# Patient Record
Sex: Female | Born: 1995 | Race: Black or African American | Hispanic: No | Smoking: Current every day smoker
Health system: Southern US, Community
[De-identification: ages and names within clinical notes are randomized; demographics above are authoritative.]

## PROBLEM LIST (undated history)

## (undated) ENCOUNTER — Inpatient Hospital Stay: Payer: Self-pay

## (undated) DIAGNOSIS — F32A Depression, unspecified: Secondary | ICD-10-CM

## (undated) DIAGNOSIS — N12 Tubulo-interstitial nephritis, not specified as acute or chronic: Secondary | ICD-10-CM

## (undated) DIAGNOSIS — F329 Major depressive disorder, single episode, unspecified: Secondary | ICD-10-CM

## (undated) DIAGNOSIS — A599 Trichomoniasis, unspecified: Secondary | ICD-10-CM

## (undated) DIAGNOSIS — D649 Anemia, unspecified: Secondary | ICD-10-CM

## (undated) DIAGNOSIS — F1910 Other psychoactive substance abuse, uncomplicated: Secondary | ICD-10-CM

## (undated) HISTORY — PX: NO PAST SURGERIES: SHX2092

---
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
## 2014-04-16 ENCOUNTER — Emergency Department: Payer: Self-pay | Admitting: Emergency Medicine

## 2014-06-08 ENCOUNTER — Ambulatory Visit: Payer: Self-pay | Admitting: Family Medicine

## 2014-06-08 LAB — CBC WITH DIFFERENTIAL/PLATELET
BASOS ABS: 0 10*3/uL (ref 0.0–0.1)
Basophil %: 0.2 %
EOS PCT: 0.2 %
Eosinophil #: 0 10*3/uL (ref 0.0–0.7)
HCT: 39.9 % (ref 35.0–47.0)
HGB: 12.9 g/dL (ref 12.0–16.0)
Lymphocyte #: 1.4 10*3/uL (ref 1.0–3.6)
Lymphocyte %: 16 %
MCH: 29.5 pg (ref 26.0–34.0)
MCHC: 32.3 g/dL (ref 32.0–36.0)
MCV: 91 fL (ref 80–100)
Monocyte #: 1 x10 3/mm — ABNORMAL HIGH (ref 0.2–0.9)
Monocyte %: 11.4 %
Neutrophil #: 6.3 10*3/uL (ref 1.4–6.5)
Neutrophil %: 72.2 %
Platelet: 265 10*3/uL (ref 150–440)
RBC: 4.37 10*6/uL (ref 3.80–5.20)
RDW: 16.7 % — AB (ref 11.5–14.5)
WBC: 8.8 10*3/uL (ref 3.6–11.0)

## 2014-06-08 LAB — COMPREHENSIVE METABOLIC PANEL
ALBUMIN: 3.9 g/dL (ref 3.8–5.6)
AST: 14 U/L (ref 0–26)
Alkaline Phosphatase: 75 U/L
Anion Gap: 10 (ref 7–16)
BUN: 6 mg/dL — AB (ref 9–21)
Bilirubin,Total: 0.6 mg/dL (ref 0.2–1.0)
CALCIUM: 9.1 mg/dL (ref 9.0–10.7)
CO2: 27 mmol/L — AB (ref 16–25)
Chloride: 100 mmol/L (ref 97–107)
Creatinine: 0.83 mg/dL (ref 0.60–1.30)
EGFR (African American): >60
Glucose: 99 mg/dL (ref 65–99)
OSMOLALITY: 271 (ref 275–301)
Potassium: 3.5 mmol/L (ref 3.3–4.7)
SGPT (ALT): 14 U/L
Sodium: 137 mmol/L (ref 132–141)
Total Protein: 8.5 g/dL (ref 6.4–8.6)

## 2014-06-08 LAB — URINALYSIS, COMPLETE
Bilirubin,UR: NEGATIVE
Glucose,UR: NEGATIVE
Ketone: NEGATIVE
Nitrite: POSITIVE
PH: 6 (ref 5.0–8.0)
Protein: 30
Specific Gravity: 1.02 (ref 1.000–1.030)

## 2014-06-08 LAB — PREGNANCY, URINE: PREGNANCY TEST, URINE: NEGATIVE m[IU]/mL

## 2014-06-09 ENCOUNTER — Emergency Department: Payer: Self-pay | Admitting: Internal Medicine

## 2014-06-09 LAB — URINALYSIS, COMPLETE
Bilirubin,UR: NEGATIVE
GLUCOSE, UR: NEGATIVE mg/dL (ref 0–75)
Nitrite: POSITIVE
PH: 5 (ref 4.5–8.0)
Protein: 100
RBC,UR: 9 /HPF (ref 0–5)
Specific Gravity: 1.014 (ref 1.003–1.030)
WBC UR: 506 /HPF (ref 0–5)

## 2014-06-09 LAB — CBC WITH DIFFERENTIAL/PLATELET
BASOS ABS: 0 10*3/uL (ref 0.0–0.1)
Basophil %: 0.1 %
Eosinophil #: 0 10*3/uL (ref 0.0–0.7)
Eosinophil %: 0 %
HCT: 39.5 % (ref 35.0–47.0)
HGB: 12.6 g/dL (ref 12.0–16.0)
Lymphocyte #: 1.8 10*3/uL (ref 1.0–3.6)
Lymphocyte %: 16.5 %
MCH: 29.3 pg (ref 26.0–34.0)
MCHC: 31.8 g/dL — ABNORMAL LOW (ref 32.0–36.0)
MCV: 92 fL (ref 80–100)
MONO ABS: 1.7 x10 3/mm — AB (ref 0.2–0.9)
Monocyte %: 15.7 %
NEUTROS ABS: 7.4 10*3/uL — AB (ref 1.4–6.5)
Neutrophil %: 67.7 %
PLATELETS: 245 10*3/uL (ref 150–440)
RBC: 4.29 10*6/uL (ref 3.80–5.20)
RDW: 16.4 % — ABNORMAL HIGH (ref 11.5–14.5)
WBC: 11 10*3/uL (ref 3.6–11.0)

## 2014-06-09 LAB — COMPREHENSIVE METABOLIC PANEL
ALK PHOS: 69 U/L
AST: 19 U/L (ref 0–26)
Albumin: 3.4 g/dL — ABNORMAL LOW (ref 3.8–5.6)
Anion Gap: 10 (ref 7–16)
BILIRUBIN TOTAL: 1 mg/dL (ref 0.2–1.0)
BUN: 7 mg/dL — ABNORMAL LOW (ref 9–21)
CO2: 26 mmol/L — AB (ref 16–25)
Calcium, Total: 8.8 mg/dL — ABNORMAL LOW (ref 9.0–10.7)
Chloride: 102 mmol/L (ref 97–107)
Creatinine: 0.88 mg/dL (ref 0.60–1.30)
EGFR (Non-African Amer.): >60
GLUCOSE: 102 mg/dL — AB (ref 65–99)
OSMOLALITY: 274 (ref 275–301)
Potassium: 3.2 mmol/L — ABNORMAL LOW (ref 3.3–4.7)
SGPT (ALT): 13 U/L — ABNORMAL LOW
Sodium: 138 mmol/L (ref 132–141)
Total Protein: 8.1 g/dL (ref 6.4–8.6)

## 2014-06-09 LAB — LIPASE, BLOOD: Lipase: 85 U/L (ref 73–393)

## 2014-06-10 LAB — URINE CULTURE

## 2015-02-19 NOTE — H&P (Signed)
L&D Evaluation:  History:  HPI 19 yo G2P1001 with LMP of ? & [facility] 03/[facility]/13 [facility] +GC & Chalmydia tx on 10/2011 & TOC.   Patient's Medical History Eczema, abnormal TSH, hyperemesis, allergic rhiniitis,seasonal allergies   Patient's Surgical History none   Medications Pre Natal Vitamins   Allergies NKDA   Social History none   Family History Non-Contributory   ROS:  ROS All systems were reviewed.  HEENT, CNS, GI, GU, Respiratory, CV, Renal and Musculoskeletal systems were found to be normal.   Exam:  Vital Signs stable   General no apparent distress   Mental Status clear   Chest clear   Heart normal sinus rhythm, no murmur/gallop/rubs   Abdomen gravid, non-tender   Estimated Fetal Weight Average for gestational age   Fetal Position vtx   Back no CVAT   Edema 1+   Reflexes 1+   Clonus negative   Pelvic 2/90/vtx0   Mebranes Intact, variables noted   FHT normal rate with no decels   Ucx irregular   Skin dry   Lymph no lymphadenopathy   Impression:  Impression IUP at 41 weeks for post-dates IOL   Plan:  Plan Cervidil placed   Comments Ext fetal and uterine monitors   Electronic Signatures: Sharee PimpleJones, Caron W (CNM)  (Signed 21-Jun-14 23:15)  Authored: L&D Evaluation   Last Updated: 21-Jun-14 23:15 by Sharee PimpleJones, Caron W (CNM)

## 2015-06-19 ENCOUNTER — Emergency Department
Admission: EM | Admit: 2015-06-19 | Discharge: 2015-06-19 | Disposition: A | Discharge status: Home / Self Care | Payer: Medicaid Other | Attending: Emergency Medicine | Admitting: Emergency Medicine

## 2015-06-19 ENCOUNTER — Encounter: Payer: Self-pay | Admitting: Medical Oncology

## 2015-06-19 DIAGNOSIS — K088 Other specified disorders of teeth and supporting structures: Secondary | ICD-10-CM | POA: Diagnosis present

## 2015-06-19 DIAGNOSIS — K0889 Other specified disorders of teeth and supporting structures: Secondary | ICD-10-CM

## 2015-06-19 MED ORDER — IBUPROFEN 800 MG PO TABS: 800.0000 mg | ORAL_TABLET | Freq: Three times a day (TID) | ORAL | Status: DC | PRN

## 2015-06-19 MED ORDER — AMOXICILLIN 500 MG PO TABS: 500.0000 mg | ORAL_TABLET | Freq: Two times a day (BID) | ORAL | Status: DC

## 2015-06-19 MED ORDER — HYDROCODONE-ACETAMINOPHEN 5-325 MG PO TABS: 1.0000 | ORAL_TABLET | ORAL | Status: DC | PRN

## 2015-06-19 NOTE — ED Provider Notes (Signed)
[facility] Emergency Department Provider Note  ____________________________________________  Time seen: Approximately 5:12 PM  I have reviewed the triage vital signs and the nursing notes.   HISTORY  Chief Complaint Dental Pain    HPI Victoria Villarreal is a 19 y.o. female who presents for evaluation was tooth pain. Patient states she is scheduled to have them extracted next week is concerned about infection prior to extraction. Rates pain as 10 over 10.   History reviewed. No pertinent past medical history.  There are no active problems to display for this patient.   History reviewed. No pertinent past surgical history.  Current Outpatient Rx  Name  Route  Sig  Dispense  Refill  . amoxicillin (AMOXIL) 500 MG tablet   Oral   Take 1 tablet (500 mg total) by mouth 2 (two) times daily.   20 tablet   0   . HYDROcodone-acetaminophen (NORCO) 5-325 MG per tablet   Oral   Take 1-2 tablets by mouth every 4 (four) hours as needed for moderate pain.   8 tablet   0   . ibuprofen (ADVIL,MOTRIN) 800 MG tablet   Oral   Take 1 tablet (800 mg total) by mouth every 8 (eight) hours as needed.   30 tablet   0     Allergies Review of patient's allergies indicates no known allergies.  No family history on file.  Social History Social History  Substance Use Topics  . Smoking status: None  . Smokeless tobacco: None  . Alcohol Use: None    Review of Systems Constitutional: No fever/chills Eyes: No visual changes. ENT: No sore throat. No dental caries noted to his teeth are coming in. With some erythema noted around the gums. Cardiovascular: Denies chest pain. Respiratory: Denies shortness of breath. Gastrointestinal: No abdominal pain.  No nausea, no vomiting.  No diarrhea.  No constipation. Genitourinary: Negative for dysuria. Musculoskeletal: Negative for back pain. Skin: Negative for rash. Neurological: Negative for headaches, focal weakness or  numbness.  10-point ROS otherwise negative.  ____________________________________________   PHYSICAL EXAM:  VITAL SIGNS: ED Triage Vitals  Enc Vitals Group     BP --      Pulse --      Resp --      Temp --      Temp src --      SpO2 --      Weight 06/19/15 1630 137 lb (62.143 kg)     Height 06/19/15 1630  (1.702 m)     Head Cir --      Peak Flow --      Pain Score 06/19/15 1631 8     Pain Loc --      Pain Edu? --      Excl. in GC? --     Constitutional: Alert and oriented. Well appearing and in no acute distress. Head: Atraumatic. Nose: No congestion/rhinnorhea. Mouth/Throat: Mucous membranes are moist.  Oropharynx non-erythematous. Neck: No stridor.   Cardiovascular: Normal rate, regular rhythm. Grossly normal heart sounds.  Good peripheral circulation. Respiratory: Normal respiratory effort.  No retractions. Lungs CTAB. Neurologic:  Normal speech and language. No gross focal neurologic deficits are appreciated. No gait instability. Skin:  Skin is warm, dry and intact. No rash noted. Psychiatric: Mood and affect are normal. Speech and behavior are normal.  ____________________________________________   LABS (all labs ordered are listed, but only abnormal results are displayed)  Labs Reviewed - No data to display   PROCEDURES  Procedure(s) performed: None  Critical Care performed: No  ____________________________________________   INITIAL IMPRESSION / ASSESSMENT AND PLAN / ED COURSE  Pertinent labs & imaging results that were available during my care of the patient were reviewed by me and considered in my medical decision making (see chart for details).  Was tooth pain. Rx given for Motrin 800 mg 3 times a day amoxicillin 500 mg 3 times a day. Patient follow-up with PCP return to the ER with any worsening symptomology. ____________________________________________   FINAL CLINICAL IMPRESSION(S) / ED DIAGNOSES  Final diagnoses:  Pain, dental       Evangeline Dakin, PA-C 06/19/15 1804  Darien Ramus, MD 06/19/15 2226

## 2015-06-19 NOTE — ED Notes (Signed)
Pt ambulatory to desk with c/o bilt gum pain from wisdom teeth.

## 2015-06-19 NOTE — Discharge Instructions (Signed)
OPTIONS FOR DENTAL FOLLOW UP CARE ° °[place] [facility] Health and Human Services - Local Safety Net Dental Clinics °http://www.ncdhhs.gov/dph/oralhealth/services/safetynetclinics.htm °  °[facility] (336-562-3123) ° °[facility] [place] (919-933-9087) ° °[facility] ([phone] ext [phone]) ° °[facility] ([phone]) ° °[facility] ([phone]) °This clinic caters to the indigent population and is on a lottery system. °Location: °[facility], [address], [place] °Clinic Hours: °Wednesdays from 6pm - 9pm, patients seen by a lottery system. °For dates, call or go to [email or web address].[facility].edu/[facility]/patients/Dental-[facility] °Services: °Cleanings, fillings and simple extractions. °Payment Options: °DENTAL WORK IS FREE OF CHARGE. Bring proof of income or support. °Best way to get seen: °Arrive at 5:15 pm - this is a lottery, NOT first come/first serve, so arriving earlier will not increase your chances of being seen. °  °  °[facility] °[phone] °Select option 1 for emergencies °  °Location: °[facility], [address], [place] °Clinic Hours: °No walk-ins accepted - call the day before to schedule an appointment. °Check in times are 9:30 am and 1:30 pm. °Services: °Simple extractions, temporary fillings, pulpectomy/pulp debridement, uncomplicated abscess drainage. °Payment Options: °PAYMENT IS DUE AT THE TIME OF SERVICE.  Fee is usually $100-200, additional surgical procedures (e.g. abscess drainage) may be extra. °Cash, checks, Visa/MasterCard accepted.  Can file Medicaid if patient is covered for dental - patient should call case worker to check. °No discount for [facility] patients. °Best way to get seen: °MUST call the day before and get onto the schedule. Can usually be seen the next 1-2 days. No walk-ins accepted. °  °  °[facility] °[phone] °   °Location: °[facility], [address], [place] °Clinic Hours: °M, W, Th, F 8am or 1:30pm, Tues 9a or 1:30 - first come/first served. °Services: °Simple extractions, temporary fillings, uncomplicated abscess drainage.  You do not need to be an [place] resident. °Payment Options: °PAYMENT IS DUE AT THE TIME OF SERVICE. °Dental insurance, otherwise sliding scale - bring proof of income or support. °Depending on income and treatment needed, cost is usually $50-200. °Best way to get seen: °Arrive early as it is first come/first served. °  °  °[facility] °[phone] °  °Location: °[address] °Clinic Hours: °Mon-Thu 8a-5p °Services: °Most basic dental services including extractions and fillings. °Payment Options: °PAYMENT IS DUE AT THE TIME OF SERVICE. °Sliding scale, up to 50% off - bring proof if income or support. °Medicaid with dental option accepted. °Best way to get seen: °Call to schedule an appointment, can usually be seen within 2 weeks OR they will try to see walk-ins - show up at 8a or 2p (you may have to wait). °  °  °[facility] °[phone] °[place] RESIDENTS ONLY °  °Location: °[facility], [address], [place],  [address] °Clinic Hours: By appointment only. °Monday - Thursday 8am-5pm, Friday 8am-12pm °Services: Cleanings, fillings, extractions. °Payment Options: °PAYMENT IS DUE AT THE TIME OF SERVICE. °Cash, Visa or MasterCard. Sliding scale - $30 minimum per service. °Best way to get seen: °Come in to office, complete packet and make an appointment - need proof of income °or support monies for each household member and proof of [place] residence. °Usually takes about a month to get in. °  °  °[facility] °[phone] °  °Location: °[address]   [place] °Clinic Hours: Walk-in Urgent Care Dental Services are offered Monday-Friday  mornings only. °The numbers of emergencies accepted daily is limited to the number of °providers available. °Maximum 15 - Mondays, Wednesdays & Thursdays °Maximum 10 - Tuesdays & Fridays °Services: °You do not need to be a [place] resident to be seen for a dental emergency. °Emergencies are defined as pain, swelling, abnormal bleeding, or dental trauma. Walkins will receive x-rays if needed. °NOTE: Dental cleaning is not an emergency. °Payment Options: °PAYMENT IS DUE AT THE TIME OF SERVICE. °Minimum co-pay is $40.00 for uninsured patients. °Minimum co-pay is $3.00 for Medicaid with dental coverage. °Dental Insurance is accepted and must be presented at time of visit. °Medicare does not cover dental. °Forms of payment: Cash, credit card, checks. °Best way to get seen: °If not previously registered with the clinic, walk-in dental registration begins at 7:15 am and is on a first come/first serve basis. °If previously registered with the clinic, call to make an appointment. °  °  °[facility] °[phone] °[place] RESIDENTS ONLY °  °Location: °[address], [place], [place] °Clinic Hours: °Mon-Thu 10a-2p °Services: Extractions only! °Payment Options: °FREE (donations accepted) - bring proof of income or support °Best way to get seen: °Call and schedule an appointment OR come at 8am on the 1st Monday of every month (except for holidays) when it is first come/first served. °  °  °[facility] °[phone] °  °Location: °[address], [place] °Clinic Hours: °Friday mornings °Services, Payment Options, Best way to get seen: °Call for info °Dental Pain °A tooth ache may be caused by cavities (tooth decay). Cavities expose the nerve of the tooth to air and hot or cold temperatures. It may come from an infection or abscess (also called a boil or furuncle) around your tooth. It is also often caused by dental caries (tooth decay). This causes the pain you are having. °DIAGNOSIS  °Your caregiver can  diagnose this problem by exam. °TREATMENT  °· If caused by an infection, it may be treated with medications which kill germs (antibiotics) and pain medications as prescribed by your caregiver. Take medications as directed. °· Only take over-the-counter or prescription medicines for pain, discomfort, or fever as directed by your caregiver. °· Whether the tooth ache today is caused by infection or dental disease, you should see your dentist as soon as possible for further care. °SEEK MEDICAL CARE IF: °The exam and treatment you received today has been provided on an emergency basis only. This is not a substitute for complete medical or dental care. If your problem worsens or new problems (symptoms) appear, and you are unable to meet with your dentist, call or return to this location. °SEEK IMMEDIATE MEDICAL CARE IF:  °· You have a fever. °· You develop redness and swelling of your face, jaw, or neck. °· You are unable to open your mouth. °· You have severe pain uncontrolled by pain medicine. °MAKE SURE YOU:  °· Understand these instructions. °· Will watch your condition. °· Will get help right away if you are not doing well or get worse. °Document Released: 09/28/2005 Document Revised: 12/21/2011 Document Reviewed: 05/16/2008 °ExitCare® Patient Information ©2015 [employer]. This information is not intended to replace advice given to you by your health care provider. Make sure you discuss any questions you have with your health care provider. ° °

## 2015-06-19 NOTE — ED Notes (Signed)
Pt reports her wisdom teeth are coming in and has pain in both lower gums.  Sx for 3 days.  Pain worse today.  Took motrin without pain relief.

## 2015-08-09 ENCOUNTER — Emergency Department
Admission: EM | Admit: 2015-08-09 | Discharge: 2015-08-09 | Disposition: A | Discharge status: Home / Self Care | Payer: No Typology Code available for payment source | Attending: Emergency Medicine | Admitting: Emergency Medicine

## 2015-08-09 ENCOUNTER — Encounter: Payer: Self-pay | Admitting: Emergency Medicine

## 2015-08-09 DIAGNOSIS — F17210 Nicotine dependence, cigarettes, uncomplicated: Secondary | ICD-10-CM | POA: Diagnosis not present

## 2015-08-09 DIAGNOSIS — Y9389 Activity, other specified: Secondary | ICD-10-CM | POA: Insufficient documentation

## 2015-08-09 DIAGNOSIS — O99331 Smoking (tobacco) complicating pregnancy, first trimester: Secondary | ICD-10-CM | POA: Diagnosis not present

## 2015-08-09 DIAGNOSIS — Z3A09 9 weeks gestation of pregnancy: Secondary | ICD-10-CM | POA: Diagnosis not present

## 2015-08-09 DIAGNOSIS — O9989 Other specified diseases and conditions complicating pregnancy, childbirth and the puerperium: Secondary | ICD-10-CM | POA: Insufficient documentation

## 2015-08-09 DIAGNOSIS — S39012A Strain of muscle, fascia and tendon of lower back, initial encounter: Secondary | ICD-10-CM | POA: Diagnosis not present

## 2015-08-09 DIAGNOSIS — Z792 Long term (current) use of antibiotics: Secondary | ICD-10-CM | POA: Insufficient documentation

## 2015-08-09 DIAGNOSIS — Y998 Other external cause status: Secondary | ICD-10-CM | POA: Diagnosis not present

## 2015-08-09 DIAGNOSIS — Y92410 Unspecified street and highway as the place of occurrence of the external cause: Secondary | ICD-10-CM | POA: Diagnosis not present
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
## 2015-08-09 NOTE — Discharge Instructions (Signed)
Cryotherapy Cryotherapy is when you put ice on your injury. Ice helps lessen pain and puffiness (swelling) after an injury. Ice works the best when you start using it in the first 24 to 48 hours after an injury. HOME CARE  Put a dry or damp towel between the ice pack and your skin.  You may press gently on the ice pack.  Leave the ice on for no more than 10 to 20 minutes at a time.  Check your skin after 5 minutes to make sure your skin is okay.  Rest at least 20 minutes between ice pack uses.  Stop using ice when your skin loses feeling (numbness).  Do not use ice on someone who cannot tell you when it hurts. This includes small children and people with memory problems (dementia). GET HELP RIGHT AWAY IF:  You have white spots on your skin.  Your skin turns blue or pale.  Your skin feels waxy or hard.  Your puffiness gets worse. MAKE SURE YOU:   Understand these instructions.  Will watch your condition.  Will get help right away if you are not doing well or get worse.   This information is not intended to replace advice given to you by your health care provider. Make sure you discuss any questions you have with your health care provider.   Document Released: 03/16/2008 Document Revised: 12/21/2011 Document Reviewed: 05/21/2011 Elsevier Interactive Patient Education [employer]2016 Elsevier Inc.    Follow-up with your doctor if any continued problems. You may take Tylenol as needed for pain sparingly. Use moist heat or ice packs to your back to help with discomfort. Return to the emergency room if any vaginal bleeding is noted or abdominal pain.

## 2015-10-03 ENCOUNTER — Emergency Department
Admission: EM | Admit: 2015-10-03 | Discharge: 2015-10-03 | Disposition: A | Discharge status: Home / Self Care | Payer: Medicaid Other | Attending: Emergency Medicine | Admitting: Emergency Medicine

## 2015-10-03 ENCOUNTER — Encounter: Payer: Self-pay | Admitting: Emergency Medicine

## 2015-10-03 DIAGNOSIS — Z792 Long term (current) use of antibiotics: Secondary | ICD-10-CM | POA: Insufficient documentation

## 2015-10-03 DIAGNOSIS — F17210 Nicotine dependence, cigarettes, uncomplicated: Secondary | ICD-10-CM | POA: Diagnosis not present

## 2015-10-03 DIAGNOSIS — K029 Dental caries, unspecified: Secondary | ICD-10-CM | POA: Insufficient documentation

## 2015-10-03 DIAGNOSIS — O99612 Diseases of the digestive system complicating pregnancy, second trimester: Secondary | ICD-10-CM | POA: Diagnosis not present

## 2015-10-03 DIAGNOSIS — Z3A17 17 weeks gestation of pregnancy: Secondary | ICD-10-CM | POA: Insufficient documentation

## 2015-10-03 DIAGNOSIS — K047 Periapical abscess without sinus: Secondary | ICD-10-CM

## 2015-10-03 DIAGNOSIS — O99332 Smoking (tobacco) complicating pregnancy, second trimester: Secondary | ICD-10-CM | POA: Insufficient documentation

## 2015-10-03 MED ORDER — PENICILLIN V POTASSIUM 500 MG PO TABS
ORAL_TABLET | ORAL | Status: AC
  Administered 2015-10-03: 500 mg via ORAL
  Filled 2015-10-03: qty 1

## 2015-10-03 MED ORDER — ACETAMINOPHEN 500 MG PO TABS: 1000.0000 mg | ORAL_TABLET | Freq: Four times a day (QID) | ORAL | Status: DC | PRN

## 2015-10-03 MED ORDER — PENICILLIN V POTASSIUM 500 MG PO TABS
500.0000 mg | ORAL_TABLET | Freq: Once | ORAL | Status: AC
  Administered 2015-10-03: 500 mg via ORAL

## 2015-10-03 MED ORDER — ACETAMINOPHEN 325 MG PO TABS
650.0000 mg | ORAL_TABLET | Freq: Once | ORAL | Status: AC
  Administered 2015-10-03: 650 mg via ORAL

## 2015-10-03 MED ORDER — ACETAMINOPHEN 325 MG PO TABS
ORAL_TABLET | ORAL | Status: AC
  Administered 2015-10-03: 650 mg via ORAL
  Filled 2015-10-03: qty 2

## 2015-10-03 NOTE — ED Notes (Signed)
[redacted] weeks pregnant Pt arrived to the ED for complaints of an abscess on the left side of the mouth. Pt reports that there is moderate swelling and pain that prevents her from swallowing without pain. Pt is AOx4 in no apparent distress speaking in complete sentences and able to swallow in triage.

## 2015-10-03 NOTE — ED Notes (Signed)
Patient to ER for left sided dental pain. States she is approx [redacted] weeks pregnant. Denies any pregnancy related symptoms.

## 2015-10-03 NOTE — Discharge Instructions (Signed)
Dental Caries °Dental caries is tooth decay. This decay can cause a hole in teeth (cavity) that can get bigger and deeper over time. °HOME CARE °· Brush and floss your teeth. Do this at least two times a day. °· Use a fluoride toothpaste. °· Use a mouth rinse if told by your dentist or doctor. °· Eat less sugary and starchy foods. Drink less sugary drinks. °· Avoid snacking often on sugary and starchy foods. Avoid sipping often on sugary drinks. °· Keep regular checkups and cleanings with your dentist. °· Use fluoride supplements if told by your dentist or doctor. °· Allow fluoride to be applied to teeth if told by your dentist or doctor. °  °This information is not intended to replace advice given to you by your health care provider. Make sure you discuss any questions you have with your health care provider. °  °Document Released: 07/07/2008 Document Revised: 10/19/2014 Document Reviewed: 09/30/2012 °Elsevier Interactive Patient Education ©2016 Elsevier Inc. ° °

## 2015-10-03 NOTE — ED Notes (Signed)
Schaevitz, MD and RN at bedside.

## 2015-10-03 NOTE — ED Provider Notes (Addendum)
[facility] Emergency Department Provider Note  ____________________________________________  Time seen: Approximately 840 PM  I have reviewed the triage vital signs and the nursing notes.   HISTORY  Chief Complaint Oral Swelling and Abscess    HPI Victoria Villarreal is a 19 y.o. female who is [redacted] weeks pregnant who is presenting today with left sided tooth pain. She says that the pain started yesterday and has been a throbbing pain to her left posterior-most mandibular molar. She says she has a Education officer, communitydentist in [place] but has not scheduled appointment to see them. Because she is pregnant she is only taking Tylenol as prescribed. Her last dose was about 5 hours prior to arrival. She denies any fevers. Does not have any pregnancy-related complaints such as vaginal bleeding or discharge or dysuria. Says that she has had pain with swallowing to the left side of her throat just under her left jaw.However, she has been able to eat ice cream and drinking fluids.   History reviewed. No pertinent past medical history.  There are no active problems to display for this patient.   History reviewed. No pertinent past surgical history.  Current Outpatient Rx  Name  Route  Sig  Dispense  Refill  . amoxicillin (AMOXIL) 500 MG tablet   Oral   Take 1 tablet (500 mg total) by mouth 2 (two) times daily.   20 tablet   0   . HYDROcodone-acetaminophen (NORCO) 5-325 MG per tablet   Oral   Take 1-2 tablets by mouth every 4 (four) hours as needed for moderate pain.   8 tablet   0   . ibuprofen (ADVIL,MOTRIN) 800 MG tablet   Oral   Take 1 tablet (800 mg total) by mouth every 8 (eight) hours as needed.   30 tablet   0     Allergies Review of patient's allergies indicates no known allergies.  History reviewed. No pertinent family history.  Social History Social History  Substance Use Topics  . Smoking status: Current Every Day Smoker -- 0.10 packs/day    Types: Cigarettes   . Smokeless tobacco: None  . Alcohol Use: No    Review of Systems Constitutional: No fever/chills Eyes: No visual changes. ENT: No sore throat. Cardiovascular: Denies chest pain. Respiratory: Denies shortness of breath. Gastrointestinal: No abdominal pain.  No nausea, no vomiting.  No diarrhea.  No constipation. Genitourinary: Negative for dysuria. Musculoskeletal: Negative for back pain. Skin: Negative for rash. Neurological: Negative for headaches, focal weakness or numbness.  10-point ROS otherwise negative.  ____________________________________________   PHYSICAL EXAM:  VITAL SIGNS: ED Triage Vitals  Enc Vitals Group     BP 10/03/15 2001 117/62 mmHg     Pulse Rate 10/03/15 2001 81     Resp 10/03/15 2001 18     Temp 10/03/15 2001 98.8 F (37.1 C)     Temp Source 10/03/15 2001 Oral     SpO2 10/03/15 2001 96 %     Weight 10/03/15 2001 146 lb (66.225 kg)     Height 10/03/15 2001 5\' 6"  (1.676 m)     Head Cir --      Peak Flow --      Pain Score 10/03/15 2015 10     Pain Loc --      Pain Edu? --      Excl. in GC? --     Constitutional: Alert and oriented. Well appearing and in no acute distress. Eyes: Conjunctivae are normal. PERRL. EOMI. Head: Atraumatic.  Nose: No congestion/rhinnorhea. Mouth/Throat: Mucous membranes are moist.  Oropharynx non-erythematous. Patient is controlling her secretions and talking in a normal voice. The soft tissue just posterior to the left mandibular-most molar is swollen and tender and appears inflamed but there is no pus and no fluctuance on palpation. Externally, there is also no gross swelling to the lower jaw or face. There is no uvular swelling or tonsillar deviation. Neck: No stridor.   Hematological/Lymphatic/Immunilogical: Tender left anterior cervical lymphadenopathy. Cardiovascular: Normal rate, regular rhythm. Grossly normal heart sounds.  Good peripheral circulation. Respiratory: Normal respiratory effort.  No retractions.  Lungs CTAB. Gastrointestinal: Soft and nontender. No distention.  Musculoskeletal: No lower extremity tenderness nor edema.  No joint effusions. Neurologic:  Normal speech and language. No gross focal neurologic deficits are appreciated. No gait instability. Skin:  Skin is warm, dry and intact. No rash noted. Psychiatric: Mood and affect are normal. Speech and behavior are normal.  ____________________________________________   LABS (all labs ordered are listed, but only abnormal results are displayed)  Labs Reviewed - No data to display ____________________________________________  EKG   ____________________________________________  RADIOLOGY   ____________________________________________   PROCEDURES  ____________________________________________   INITIAL IMPRESSION / ASSESSMENT AND PLAN / ED COURSE  Pertinent labs & imaging results that were available during my care of the patient were reviewed by me and considered in my medical decision making (see chart for details).  We will prescribe patient penicillin and she has requested a prescription for Tylenol as well. I discussed with her that the penicillin and Tylenol may be able to relieve the symptoms temporarily but for any definitive treatment she will need to go to her dentist. She says that she will call her dentist first thing in the morning that they also do walk-ins. I urged her to go first thing tomorrow morning for follow-up with her dentist. At this time the patient is not having any airway compromise. She is controlling her secretions. She has been able to tolerate by mouth fluids and soft diet. The area where she points to that hurts when she swallows over the left anterior cervical lymph nodes not believe this is what is causing the pain when she swallows. I do not believe she has an area compromise from this process. There was also no swelling to the floor of the mouth or to the soft tissue where there would be  Ludwig's angina. ____________________________________________   FINAL CLINICAL IMPRESSION(S) / ED DIAGNOSES  Dental caries.    Myrna Blazer, MD 10/03/15 2057  Patient says that she is currently taking prenatal vitamins  Myrna Blazer, MD 10/03/15 2101

## 2015-10-13 NOTE — L&D Delivery Note (Addendum)
Delivery Note  First Stage: Labor onset: 2145 Augmentation : none Analgesia /Anesthesia intrapartum: Stadol 1mg  IVP x 1, epidural SROM at 0325 - clear fluid  Second Stage: Complete dilation at 0327 Onset of pushing at 0327 FHR second stage: category 2: baseline: 135 bpm/ Moderate variability/ +accels/ occasional variable decel with contractions - deepest variable to nadir of 60 bpm x 1 with good return to baseline   Delivery of a viable female "Shante" at [id number] by Carlean JewsMeredith Sigmon, CNM in ROA position No nuchal cord Cord double clamped after cessation of pulsation, cut by FOB Cord blood sample collected    Third Stage: Placenta delivered via Tomasa BlaseSchultz intact with 3 VC @ 0342  - Marginal cord insertion ([place]), calcifications noted Placenta disposition: pathology  Uterine tone firm with massage / bleeding - moderate, but slowed with massage and Pitocin 500mL bolus infusing   Right labial laceration identified, small left labial lac - hemostatic no repair indicated  Anesthesia for repair: 1% Lidocaine 30mL Repair: 3.0 vicryl: 1 interrupted stitch placed Est. Blood Loss (mL): 250 mL  Complications: none  Mom to postpartum.  Baby to Couplet care / Skin to Skin.  Newborn: Birth Weight: 3317 grams (7#5oz) Apgar Scores: 8, 9 Feeding planned: Breast/Formula  Carlean JewsMeredith Sigmon, CNM

## 2015-12-14 ENCOUNTER — Observation Stay
Admission: EM | Admit: 2015-12-14 | Discharge: 2015-12-14 | Disposition: A | Discharge status: Home / Self Care | Payer: Medicaid Other | Attending: Obstetrics and Gynecology | Admitting: Obstetrics and Gynecology

## 2015-12-14 DIAGNOSIS — R112 Nausea with vomiting, unspecified: Secondary | ICD-10-CM | POA: Insufficient documentation

## 2015-12-14 DIAGNOSIS — F17210 Nicotine dependence, cigarettes, uncomplicated: Secondary | ICD-10-CM | POA: Diagnosis not present

## 2015-12-14 DIAGNOSIS — Z3A27 27 weeks gestation of pregnancy: Secondary | ICD-10-CM | POA: Insufficient documentation

## 2015-12-14 DIAGNOSIS — O0932 Supervision of pregnancy with insufficient antenatal care, second trimester: Secondary | ICD-10-CM | POA: Insufficient documentation

## 2015-12-14 DIAGNOSIS — O99332 Smoking (tobacco) complicating pregnancy, second trimester: Secondary | ICD-10-CM | POA: Diagnosis not present

## 2015-12-14 DIAGNOSIS — O26892 Other specified pregnancy related conditions, second trimester: Principal | ICD-10-CM | POA: Insufficient documentation

## 2015-12-14 DIAGNOSIS — R0981 Nasal congestion: Secondary | ICD-10-CM | POA: Insufficient documentation

## 2015-12-14 DIAGNOSIS — Z0371 Encounter for suspected problem with amniotic cavity and membrane ruled out: Secondary | ICD-10-CM | POA: Diagnosis present

## 2015-12-14 MED ORDER — ONDANSETRON HCL 4 MG PO TABS: 4.0000 mg | ORAL_TABLET | Freq: Once | ORAL | Status: DC

## 2015-12-14 NOTE — Discharge Summary (Signed)
MD NOTE:  LMP: 06/02/15 EDD: 03/08/16  19yo [id number] @ 27+6 here for LOF earlier today when vomiting. No ctx, no vb. Has had 2 episodes of nausea/vomiting in the last few days after eating a hot pocket. Otherwise tolerating PO. Also with some nasal congestion. Concerned she may have broken her water, but also thinks it is likely urine since she has not had leakage since.   [facility] Late entry into prenatal care at 16+2 weeks   Tobacco Use during pregnancy: smoking 4-10 cigarettes per day / Nicotine patches started re-ordered on 11/19/15 (pt. Never started)  MBT O+ Ab screen Neg Pap Cultures only Negative HIV Neg Hep B/RPR Neg/NR  Rubella Immune VZV Immune  Last US: on 10/22/15: Single IUP/FHR 149bpm/Vtx/Lt. Lateral posterior placenta/normal anatomy seen  OB History    Gravida Para Term Preterm AB TAB SAB Ectopic Multiple Living   3 2 2  0 0 0 0 0 0 2     No past medical history on file.  No past surgical history on file.  Social History   Social History  . Marital Status: Married    Spouse Name: N/A  . Number of Children: N/A  . Years of Education: N/A   Occupational History  . Not on file.   Social History Main Topics  . Smoking status: Current Every Day Smoker -- 0.10 packs/day    Types: Cigarettes  . Smokeless tobacco: Not on file  . Alcohol Use: No  . Drug Use: No  . Sexual Activity: Yes    Birth Control/ Protection: None   Other Topics Concern  . Not on file   Social History Narrative    No Known Allergies  No current facility-administered medications on file prior to encounter.   Current Outpatient Prescriptions on File Prior to Encounter  Medication Sig Dispense Refill  . acetaminophen (TYLENOL) 500 MG tablet Take 2 tablets (1,000 mg total) by mouth every 6 (six) hours as needed for mild pain or moderate pain. (Patient not taking: Reported on 12/14/2015) 12 tablet 0  . amoxicillin (AMOXIL) 500 MG tablet Take 1 tablet (500 mg total) by mouth 2  (two) times daily. (Patient not taking: Reported on 12/14/2015) 20 tablet 0  . HYDROcodone-acetaminophen (NORCO) 5-325 MG per tablet Take 1-2 tablets by mouth every 4 (four) hours as needed for moderate pain. (Patient not taking: Reported on 12/14/2015) 8 tablet 0  . ibuprofen (ADVIL,MOTRIN) 800 MG tablet Take 1 tablet (800 mg total) by mouth every 8 (eight) hours as needed. (Patient not taking: Reported on 12/14/2015) 30 tablet 0   O: There were no vitals filed for this visit.  GEN: NAD ABD: Soft NT  Per RN:  Pelvic w/o evidence of leakage Nitrazine neg  EFM: 130 mod var, +accel, no decel Toco: none  A/P: 19yo [id number] @ 27+6 with likely loss of urine. Nitrazine test neg. No further leakage. Stable for d/c home with f/u appt next week.   Ala DachJohanna K Halfon, MD

## 2016-01-25 ENCOUNTER — Encounter: Payer: Self-pay | Admitting: Emergency Medicine

## 2016-01-25 DIAGNOSIS — R079 Chest pain, unspecified: Secondary | ICD-10-CM | POA: Insufficient documentation

## 2016-01-25 DIAGNOSIS — Z5321 Procedure and treatment not carried out due to patient leaving prior to being seen by health care provider: Secondary | ICD-10-CM | POA: Insufficient documentation

## 2016-01-25 NOTE — ED Notes (Addendum)
Pt presents to ED with chest pain in the center of her chest. Pt states she was watching tv tonight at the onset of her pain. Pt alert and calm talking on her phone during triage. No increased work of breathing or acute distress noted at this time. Pt states her pain has improved since onset. Pt currently 33 weeks 6 days pregnant. Pt reports having shortness of breath initially but not currently. +cough denies congestion or any other symptoms at this time.

## 2016-01-26 ENCOUNTER — Emergency Department
Admission: EM | Admit: 2016-01-26 | Discharge: 2016-01-27 | Disposition: A | Discharge status: Home / Self Care | Payer: Medicaid Other | Attending: Emergency Medicine | Admitting: Emergency Medicine

## 2016-01-27 ENCOUNTER — Telehealth: Payer: Self-pay | Admitting: Emergency Medicine

## 2016-01-27 NOTE — ED Notes (Signed)
Called patient due to lwot to inquire about condition and follow up plans. Pt says she has appt with ob in 2 weeks.  i advised her to call them now to notify of sx sob.  She agrees.

## 2016-02-12 ENCOUNTER — Other Ambulatory Visit: Payer: Self-pay | Admitting: Obstetrics and Gynecology

## 2016-02-12 DIAGNOSIS — R599 Enlarged lymph nodes, unspecified: Secondary | ICD-10-CM

## 2016-02-19 ENCOUNTER — Ambulatory Visit: Admission: RE | Admit: 2016-02-19 | Payer: Medicaid Other | Source: Ambulatory Visit

## 2016-03-04 ENCOUNTER — Inpatient Hospital Stay
Admission: EM | Admit: 2016-03-04 | Discharge: 2016-03-06 | DRG: 775 | Disposition: A | Discharge status: Home / Self Care | Payer: Medicaid Other | Attending: Obstetrics and Gynecology | Admitting: Obstetrics and Gynecology

## 2016-03-04 DIAGNOSIS — Z3A39 39 weeks gestation of pregnancy: Secondary | ICD-10-CM

## 2016-03-04 DIAGNOSIS — O99334 Smoking (tobacco) complicating childbirth: Secondary | ICD-10-CM | POA: Diagnosis present

## 2016-03-04 DIAGNOSIS — Z3490 Encounter for supervision of normal pregnancy, unspecified, unspecified trimester: Secondary | ICD-10-CM

## 2016-03-04 DIAGNOSIS — F17210 Nicotine dependence, cigarettes, uncomplicated: Secondary | ICD-10-CM | POA: Diagnosis present

## 2016-03-04 HISTORY — DX: Trichomoniasis, unspecified: A59.9

## 2016-03-05 ENCOUNTER — Inpatient Hospital Stay: Payer: Medicaid Other | Admitting: Anesthesiology

## 2016-03-05 DIAGNOSIS — Z3490 Encounter for supervision of normal pregnancy, unspecified, unspecified trimester: Secondary | ICD-10-CM

## 2016-03-05 DIAGNOSIS — Z3483 Encounter for supervision of other normal pregnancy, third trimester: Secondary | ICD-10-CM | POA: Diagnosis present

## 2016-03-05 DIAGNOSIS — F17210 Nicotine dependence, cigarettes, uncomplicated: Secondary | ICD-10-CM | POA: Diagnosis present

## 2016-03-05 DIAGNOSIS — O99334 Smoking (tobacco) complicating childbirth: Secondary | ICD-10-CM | POA: Diagnosis present

## 2016-03-05 DIAGNOSIS — Z3A39 39 weeks gestation of pregnancy: Secondary | ICD-10-CM | POA: Diagnosis not present

## 2016-03-05 LAB — CBC
HCT: 31.8 % — ABNORMAL LOW (ref 35.0–47.0)
HEMATOCRIT: 33 % — AB (ref 35.0–47.0)
HEMOGLOBIN: 10.2 g/dL — AB (ref 12.0–16.0)
HEMOGLOBIN: 10.9 g/dL — AB (ref 12.0–16.0)
MCH: 25.8 pg — ABNORMAL LOW (ref 26.0–34.0)
MCH: 26.5 pg (ref 26.0–34.0)
MCHC: 32.1 g/dL (ref 32.0–36.0)
MCHC: 33.2 g/dL (ref 32.0–36.0)
MCV: 79.9 fL — ABNORMAL LOW (ref 80.0–100.0)
MCV: 80.1 fL (ref 80.0–100.0)
PLATELETS: 245 10*3/uL (ref 150–440)
Platelets: 277 10*3/uL (ref 150–440)
RBC: 3.97 MIL/uL (ref 3.80–5.20)
RBC: 4.13 MIL/uL (ref 3.80–5.20)
RDW: 16 % — ABNORMAL HIGH (ref 11.5–14.5)
RDW: 16.1 % — ABNORMAL HIGH (ref 11.5–14.5)
WBC: 13.8 10*3/uL — AB (ref 3.6–11.0)
WBC: 9.9 10*3/uL (ref 3.6–11.0)

## 2016-03-05 LAB — URINALYSIS COMPLETE WITH MICROSCOPIC (ARMC ONLY)
Bilirubin Urine: NEGATIVE
GLUCOSE, UA: NEGATIVE mg/dL
HGB URINE DIPSTICK: NEGATIVE
Ketones, ur: NEGATIVE mg/dL
Leukocytes, UA: NEGATIVE
NITRITE: NEGATIVE
PROTEIN: NEGATIVE mg/dL
Specific Gravity, Urine: 1.011 (ref 1.005–1.030)
pH: 6 (ref 5.0–8.0)

## 2016-03-05 LAB — TYPE AND SCREEN
ABO/RH(D): O POS
Antibody Screen: NEGATIVE

## 2016-03-05 LAB — CHLAMYDIA/NGC RT PCR (ARMC ONLY)
CHLAMYDIA TR: NOT DETECTED
N GONORRHOEAE: NOT DETECTED

## 2016-03-05 LAB — ABO/RH: ABO/RH(D): O POS

## 2016-03-05 MED ORDER — DIPHENHYDRAMINE HCL 25 MG PO CAPS
25.0000 mg | ORAL_CAPSULE | ORAL | Status: DC | PRN
Start: 2016-03-05 — End: 2016-03-05

## 2016-03-05 MED ORDER — LIDOCAINE HCL (PF) 1 % IJ SOLN
30.0000 mL | INTRAMUSCULAR | Status: DC | PRN
  Administered 2016-03-05: 30 mL via SUBCUTANEOUS

## 2016-03-05 MED ORDER — DIPHENHYDRAMINE HCL 25 MG PO CAPS: 25.0000 mg | ORAL_CAPSULE | Freq: Four times a day (QID) | ORAL | Status: DC | PRN

## 2016-03-05 MED ORDER — PRENATAL MULTIVITAMIN CH
1.0000 | ORAL_TABLET | Freq: Every day | ORAL | Status: DC
  Administered 2016-03-05 – 2016-03-06 (×2): 1 via ORAL
  Filled 2016-03-05 (×2): qty 1

## 2016-03-05 MED ORDER — OXYTOCIN 40 UNITS IN LACTATED RINGERS INFUSION - SIMPLE MED
2.5000 [IU]/h | INTRAVENOUS | Status: DC
  Administered 2016-03-05: 2.5 [IU]/h via INTRAVENOUS
  Filled 2016-03-05: qty 1000

## 2016-03-05 MED ORDER — OXYCODONE-ACETAMINOPHEN 5-325 MG PO TABS
2.0000 | ORAL_TABLET | ORAL | Status: DC | PRN
  Administered 2016-03-05 – 2016-03-06 (×6): 2 via ORAL
  Filled 2016-03-05 (×6): qty 2

## 2016-03-05 MED ORDER — SODIUM CHLORIDE 0.9% FLUSH: 3.0000 mL | INTRAVENOUS | Status: DC | PRN

## 2016-03-05 MED ORDER — SOD CITRATE-CITRIC ACID 500-334 MG/5ML PO SOLN: 30.0000 mL | ORAL | Status: DC | PRN

## 2016-03-05 MED ORDER — NALBUPHINE HCL 10 MG/ML IJ SOLN: 5.0000 mg | Freq: Once | INTRAMUSCULAR | Status: DC | PRN

## 2016-03-05 MED ORDER — MEPERIDINE HCL 25 MG/ML IJ SOLN: 6.2500 mg | INTRAMUSCULAR | Status: DC | PRN

## 2016-03-05 MED ORDER — ONDANSETRON HCL 4 MG PO TABS: 4.0000 mg | ORAL_TABLET | ORAL | Status: DC | PRN

## 2016-03-05 MED ORDER — MISOPROSTOL 200 MCG PO TABS
ORAL_TABLET | ORAL | Status: AC
  Filled 2016-03-05: qty 4

## 2016-03-05 MED ORDER — ONDANSETRON HCL 4 MG/2ML IJ SOLN: 4.0000 mg | INTRAMUSCULAR | Status: DC | PRN

## 2016-03-05 MED ORDER — FERROUS SULFATE 325 (65 FE) MG PO TABS
325.0000 mg | ORAL_TABLET | Freq: Two times a day (BID) | ORAL | Status: DC
Start: 2016-03-05 — End: 2016-03-06
  Administered 2016-03-05 – 2016-03-06 (×3): 325 mg via ORAL
  Filled 2016-03-05 (×3): qty 1

## 2016-03-05 MED ORDER — SIMETHICONE 80 MG PO CHEW: 80.0000 mg | CHEWABLE_TABLET | ORAL | Status: DC | PRN

## 2016-03-05 MED ORDER — SODIUM CHLORIDE 0.9 % IV SOLN
INTRAVENOUS | Status: DC | PRN
  Administered 2016-03-05 (×2): 5 mL via EPIDURAL

## 2016-03-05 MED ORDER — ONDANSETRON HCL 4 MG/2ML IJ SOLN: 4.0000 mg | Freq: Four times a day (QID) | INTRAMUSCULAR | Status: DC | PRN

## 2016-03-05 MED ORDER — LACTATED RINGERS IV SOLN
500.0000 mL | INTRAVENOUS | Status: DC | PRN
  Administered 2016-03-05: 1000 mL via INTRAVENOUS

## 2016-03-05 MED ORDER — OXYCODONE-ACETAMINOPHEN 5-325 MG PO TABS
1.0000 | ORAL_TABLET | ORAL | Status: DC | PRN
  Administered 2016-03-05: 1 via ORAL
  Filled 2016-03-05: qty 1

## 2016-03-05 MED ORDER — LIDOCAINE-EPINEPHRINE (PF) 1.5 %-1:200000 IJ SOLN
INTRAMUSCULAR | Status: DC | PRN
  Administered 2016-03-05: 3 mL via EPIDURAL

## 2016-03-05 MED ORDER — DIBUCAINE 1 % RE OINT: 1.0000 "application " | TOPICAL_OINTMENT | RECTAL | Status: DC | PRN

## 2016-03-05 MED ORDER — COCONUT OIL OIL: 1.0000 "application " | TOPICAL_OIL | Status: DC | PRN

## 2016-03-05 MED ORDER — NALBUPHINE HCL 10 MG/ML IJ SOLN: 5.0000 mg | INTRAMUSCULAR | Status: DC | PRN

## 2016-03-05 MED ORDER — WITCH HAZEL-GLYCERIN EX PADS: 1.0000 "application " | MEDICATED_PAD | CUTANEOUS | Status: DC | PRN

## 2016-03-05 MED ORDER — OXYTOCIN 10 UNIT/ML IJ SOLN
INTRAMUSCULAR | Status: AC
  Filled 2016-03-05: qty 2

## 2016-03-05 MED ORDER — IBUPROFEN 600 MG PO TABS
600.0000 mg | ORAL_TABLET | Freq: Four times a day (QID) | ORAL | Status: DC
  Administered 2016-03-05 – 2016-03-06 (×6): 600 mg via ORAL
  Filled 2016-03-05 (×6): qty 1

## 2016-03-05 MED ORDER — BUTORPHANOL TARTRATE 1 MG/ML IJ SOLN
INTRAMUSCULAR | Status: AC
  Administered 2016-03-05: 1 mg
  Filled 2016-03-05: qty 2

## 2016-03-05 MED ORDER — AMMONIA AROMATIC IN INHA
RESPIRATORY_TRACT | Status: AC
  Filled 2016-03-05: qty 10

## 2016-03-05 MED ORDER — ACETAMINOPHEN 325 MG PO TABS: 650.0000 mg | ORAL_TABLET | ORAL | Status: DC | PRN

## 2016-03-05 MED ORDER — BENZOCAINE-MENTHOL 20-0.5 % EX AERO
1.0000 "application " | INHALATION_SPRAY | CUTANEOUS | Status: DC | PRN
  Administered 2016-03-05: 1 via TOPICAL
  Filled 2016-03-05: qty 56

## 2016-03-05 MED ORDER — BUTORPHANOL TARTRATE 1 MG/ML IJ SOLN: 2.0000 mg | INTRAMUSCULAR | Status: DC | PRN

## 2016-03-05 MED ORDER — OXYTOCIN BOLUS FROM INFUSION
500.0000 mL | INTRAVENOUS | Status: DC
  Administered 2016-03-05: 500 mL via INTRAVENOUS

## 2016-03-05 MED ORDER — NALOXONE HCL 0.4 MG/ML IJ SOLN: 0.4000 mg | INTRAMUSCULAR | Status: DC | PRN

## 2016-03-05 MED ORDER — LIDOCAINE HCL (PF) 1 % IJ SOLN
INTRAMUSCULAR | Status: AC
  Filled 2016-03-05: qty 30

## 2016-03-05 MED ORDER — DIPHENHYDRAMINE HCL 50 MG/ML IJ SOLN: 12.5000 mg | INTRAMUSCULAR | Status: DC | PRN

## 2016-03-05 MED ORDER — NALOXONE HCL 2 MG/2ML IJ SOSY
1.0000 ug/kg/h | PREFILLED_SYRINGE | INTRAVENOUS | Status: DC | PRN
  Filled 2016-03-05: qty 2

## 2016-03-05 MED ORDER — FENTANYL 2.5 MCG/ML W/ROPIVACAINE 0.2% IN NS 100 ML EPIDURAL INFUSION (ARMC-ANES)
EPIDURAL | Status: AC
  Filled 2016-03-05: qty 100

## 2016-03-05 MED ORDER — KETOROLAC TROMETHAMINE 30 MG/ML IJ SOLN
30.0000 mg | Freq: Four times a day (QID) | INTRAMUSCULAR | Status: AC | PRN
Start: 2016-03-05 — End: 2016-03-06

## 2016-03-05 MED ORDER — SENNOSIDES-DOCUSATE SODIUM 8.6-50 MG PO TABS
2.0000 | ORAL_TABLET | ORAL | Status: DC
  Administered 2016-03-06: 2 via ORAL
  Filled 2016-03-05: qty 2

## 2016-03-05 MED ORDER — FENTANYL 2.5 MCG/ML W/ROPIVACAINE 0.2% IN NS 100 ML EPIDURAL INFUSION (ARMC-ANES)
10.0000 mL/h | EPIDURAL | Status: DC
  Administered 2016-03-05: 10 mL/h via EPIDURAL

## 2016-03-05 MED ORDER — KETOROLAC TROMETHAMINE 30 MG/ML IJ SOLN: 30.0000 mg | Freq: Four times a day (QID) | INTRAMUSCULAR | Status: AC | PRN

## 2016-03-05 MED ORDER — LACTATED RINGERS IV SOLN
INTRAVENOUS | Status: DC
  Administered 2016-03-05 (×2): via INTRAVENOUS

## 2016-03-05 MED ORDER — ZOLPIDEM TARTRATE 5 MG PO TABS: 5.0000 mg | ORAL_TABLET | Freq: Every evening | ORAL | Status: DC | PRN

## 2016-03-05 MED ORDER — ONDANSETRON HCL 4 MG/2ML IJ SOLN: 4.0000 mg | Freq: Three times a day (TID) | INTRAMUSCULAR | Status: DC | PRN

## 2016-03-05 NOTE — Anesthesia Preprocedure Evaluation (Signed)
Anesthesia Evaluation  Patient identified by MRN, date of birth, ID band Patient awake    Reviewed: Allergy & Precautions, H&P , NPO status , Patient's Chart, lab work & pertinent test results, reviewed documented beta blocker date and time   History of Anesthesia Complications Negative for: history of anesthetic complications  Airway Mallampati: II  TM Distance: >3 FB Neck ROM: full    Dental no notable dental hx. (+) Teeth Intact   Pulmonary neg shortness of breath, neg sleep apnea, neg COPD, neg recent URI, Current Smoker,    Pulmonary exam normal breath sounds clear to auscultation       Cardiovascular Exercise Tolerance: Good negative cardio ROS Normal cardiovascular exam Rhythm:regular Rate:Normal     Neuro/Psych negative neurological ROS  negative psych ROS   GI/Hepatic Neg liver ROS, GERD  ,  Endo/Other  negative endocrine ROS  Renal/GU negative Renal ROS  negative genitourinary   Musculoskeletal   Abdominal   Peds  Hematology negative hematology ROS (+)   Anesthesia Other Findings Past Medical History:   Trichomoniasis                                               Reproductive/Obstetrics (+) Pregnancy                             Anesthesia Physical Anesthesia Plan  ASA: II  Anesthesia Plan: Epidural   Post-op Pain Management:    Induction:   Airway Management Planned:   Additional Equipment:   Intra-op Plan:   Post-operative Plan:   Informed Consent: I have reviewed the patients History and Physical, chart, labs and discussed the procedure including the risks, benefits and alternatives for the proposed anesthesia with the patient or authorized representative who has indicated his/her understanding and acceptance.   Dental Advisory Given  Plan Discussed with: Anesthesiologist, CRNA and Surgeon  Anesthesia Plan Comments:         Anesthesia Quick  Evaluation

## 2016-03-05 NOTE — Progress Notes (Signed)
Post Partum Day 1 Subjective: no complaints, up ad lib, voiding and tolerating PO, Pt states her Rt leg is still tingly and feels numb. Moving without difficulty  Objective: Blood pressure 112/55, pulse 79, temperature 97.9 F (36.6 C), temperature source Oral, resp. rate 18, height 5\' 7"  (1.702 m), weight 176 lb (79.833 kg), last menstrual period 06/02/2015, SpO2 98 %, unknown if currently breastfeeding.  Physical Exam:  General: alert, cooperative and appears stated age  Heart: S1S2, RRR, No M/R/G. Lungs: CTA bilat, no W/R/R. Lochia: appropriate Uterine Fundus: firm RLE: Movement normal, +numbness to light palpation DVT Evaluation: No evidence of DVT seen on physical exam. Negative Homan's sign.   Recent Labs  03/05/16 0023 03/05/16 0728  HGB 10.9* 10.2*  HCT 33.0* 31.8*  WBC 9.9 13.8*  PLT 277 245    Assessment/Plan: Plan for discharge tomorrow   LOS: 0 days   Sharee Pimplearon W Brookelin Felber 03/05/2016, 8:43 AM

## 2016-03-05 NOTE — Plan of Care (Signed)
Problem: Education: Goal: Knowledge of Childbirth will improve Outcome: Completed/Met Date Met:  03/05/16 SVD term female infant, apgar 8/9, labial abrasion, repair completed. EBL 250. Hx trich, ua sent to lab. Epidural d'ced. Pt labored and delivered without difficulty following Epidural. R leg weakness but pt able to transfer from bed to [facility] for relocation to M/B unit. Motrin, Percocet x1 and Dermoplast spray to pt. Infant to nursery for bath.

## 2016-03-05 NOTE — Anesthesia Procedure Notes (Signed)
Epidural Patient location during procedure: OB Start time: 03/05/2016 1:22 AM End time: 03/05/2016 1:32 AM  Staffing Anesthesiologist: Lenard SimmerKARENZ, Ayeshia Coppin Performed by: anesthesiologist   Preanesthetic Checklist Completed: patient identified, site marked, surgical consent, pre-op evaluation, timeout performed, IV checked, risks and benefits discussed and monitors and equipment checked  Epidural Patient position: sitting Prep: ChloraPrep Patient monitoring: heart rate, continuous pulse ox and blood pressure Approach: midline Location: L4-L5 Injection technique: LOR saline  Needle:  Needle type: Tuohy  Needle gauge: 17 G Needle length: 9 cm and 9 Needle insertion depth: 4.5 cm Catheter type: closed end flexible Catheter size: 20 Guage Catheter at skin depth: 9 cm Test dose: negative and 1.5% lidocaine with Epi 1:200 K  Assessment Events: blood not aspirated, injection not painful, no injection resistance, negative IV test and no paresthesia  Additional Notes   Patient tolerated the insertion well without complications.Reason for block:procedure for pain

## 2016-03-05 NOTE — Progress Notes (Signed)
Pt [facility] to [facility], reports she started having painful contractions around 9pm tonight, coming 5 mins apart, recently contractions have been coming closer, every 2 mins, and feeling pressure. Pt denies spotting, vaginal bleeding, leaking or gush of fluid, or n/v. G3P2, 39.4wks. Last seen in clinic yesterday, cervix was 3cm dilated, GBS neg.

## 2016-03-05 NOTE — H&P (Signed)
OB ADMISSION/ HISTORY & PHYSICAL:  Admission Date: 03/04/2016 11:52 PM  Admit Diagnosis: Active labor at 39+4 weeks  Victoria Villarreal is a 20 y.o. female presenting for active labor at 39+4 weeks.  She reports contractions starting around 9:45 pm.  She reports the CNM stripped her membranes yesterday in the office and she 3cm.    Prenatal History: [id number]   EDC : 03/08/2016, by Last Menstrual Period 07/03/15 [facility]  Prenatal course complicated by late entry to prenatal care at 16 weeks, tobacco use in pregnancy, hx. Of trichomonas in pregnancy   Prenatal Labs: ABO, Rh:   O Positive Antibody:  Negative  Rubella:   Immune Varicella: Immune RPR:   Non-reactive HBsAg:   Negative HIV:   Negative GTT: 79 GBS:   Negative   Medical / Surgical History :  Past medical history: No past medical history on file.   Past surgical history: No past surgical history on file.  Family History: No family history on file.   Social History:  reports that she has been smoking Cigarettes.  She has been smoking about 0.50 packs per day. She does not have any smokeless tobacco history on file. She reports that she does not drink alcohol or use illicit drugs.   Allergies: Review of patient's allergies indicates no known allergies.    Current Medications at time of admission:  Prior to Admission medications   Medication Sig Start Date End Date Taking? Authorizing Provider  acetaminophen (TYLENOL) 500 MG tablet Take 2 tablets (1,000 mg total) by mouth every 6 (six) hours as needed for mild pain or moderate pain. Patient not taking: Reported on 12/14/2015 10/03/15   Myrna Blazeravid Matthew Schaevitz, MD  amoxicillin (AMOXIL) 500 MG tablet Take 1 tablet (500 mg total) by mouth 2 (two) times daily. Patient not taking: Reported on 12/14/2015 06/19/15   Evangeline Dakinharles M Beers, PA-C  HYDROcodone-acetaminophen [facility](NORCO) 5-325 MG per tablet Take 1-2 tablets by mouth every 4 (four) hours as needed for moderate  pain. Patient not taking: Reported on 12/14/2015 06/19/15   Charmayne Sheerharles M Beers, PA-C  ibuprofen (ADVIL,MOTRIN) 800 MG tablet Take 1 tablet (800 mg total) by mouth every 8 (eight) hours as needed. Patient not taking: Reported on 12/14/2015 06/19/15   Evangeline Dakinharles M Beers, PA-C  Prenatal Vit-Fe Fumarate-FA (PRENATAL MULTIVITAMIN) TABS tablet Take 1 tablet by mouth daily at 12 noon.    Historical Provider, MD     Review of Systems: Active FM onset of ctx @ 2145  No LOF No bloody show   Physical Exam:  VS: Blood pressure 118/69, pulse 95, temperature 98.6 F (37 C), temperature source Oral, resp. rate 22, height 5\' 7"  (1.702 m), weight 79.833 kg (176 lb), last menstrual period 06/02/2015.  General: alert and oriented, appears  To be in pain with contractions, tearful Heart: RRR Lungs: Clear lung fields Abdomen: Gravid, soft and non-tender, non-distended / uterus: gravid, nontender Extremities: no edema  Genitalia / VE: Dilation: 5 Effacement (%): 70 Station: -2 Exam by:: N.Sullivan, RN  FHR: baseline rate 125 bpm/ variability Moderate / accelerations none/no  decelerations TOCO: every 1-3 minutes/ moderate  Assessment: 39+[redacted] weeks gestation Active stage of labor FHR category 2   Plan:  1. Admit to [facility]  - Routine labor and delivery orders  - Stadol 1mg  every 1 hour PRN for pain  - May have epidural upon request 2. GBS Negative  - no prophylaxis indicated 3. Contraception  - None 4. Anticipate NSVD  -  EFW on 02/11/16: 6#6oz  Dr. Tiburcio Pea notified of admission / plan of care  Carlean Jews, CNM

## 2016-03-05 NOTE — Anesthesia Postprocedure Evaluation (Addendum)
Anesthesia Post Note  Patient: Victoria Villarreal  Procedure(s) Performed: * No procedures listed *  Patient location during evaluation: Mother Baby Anesthesia Type: Epidural Level of consciousness: awake and alert Pain management: pain level controlled Vital Signs Assessment: post-procedure vital signs reviewed and stable Respiratory status: spontaneous breathing Postop Assessment: no headache and epidural receding Comments: Pt c/o of tingling to RLE, has equal strength bilateral, pt states the tingling is resolving, will continue to follow 03/06/2016:patient reevaluated- patient denies any tingling to right leg. Able to move both legs; equal strength    Last Vitals:  Filed Vitals:   03/05/16 0600 03/05/16 0759  BP: 107/58 112/55  Pulse: 64 79  Temp: 36.7 C 36.6 C  Resp: 18 18    Last Pain:  Filed Vitals:   03/05/16 0946  PainSc: 4                  Jules SchickLogan,  Benjamin P

## 2016-03-05 NOTE — Progress Notes (Signed)
0115 - Dr Karlton LemonKarenz in room to place Epidural. Pt assisted with getting into position. LR bolus infusing, procedure including what to expect and s/s to report explained to pt.  0125 - timeout performed for procedure. Consent signed and in chart 0128 - Epidural cath placed, pt tolerating procedure well. 0134 - Epidural dressing placed, pt assisted with lying supine, monitors adjusted 0135 - 0.125% bupivacaine loading dose #1 injected 0137 - Pt able to wiggle all toes and moving legs,  0139 - pump setup and started at 3110ml/hr via alaris pump, 2nd loading dose given.

## 2016-03-06 LAB — SURGICAL PATHOLOGY

## 2016-03-06 LAB — RPR: RPR Ser Ql: NONREACTIVE

## 2016-03-06 NOTE — Progress Notes (Signed)
Discharge instructions given. Patient verbalizes understanding of teaching. Patient discharged home at 1515.

## 2016-03-06 NOTE — Discharge Summary (Signed)
OB Discharge Summary     Patient Name: Victoria Villarreal DOB: September 09, 1996 MRN: [id number]  Date of admission: 03/04/2016 Delivering MD: Carlean Jews C   Date of discharge: 03/06/2016  Admitting diagnosis: 39 Weeks Contractions Intrauterine pregnancy: [redacted]w[redacted]d     Secondary diagnosis:  Active Problems:   Pregnancy  Additional problems: none     Discharge diagnosis: Term Pregnancy Delivered                                                                                                Post partum procedures:none  Augmentation: none  Complications: None  Hospital course:  Onset of Labor With Vaginal Delivery     20 y.o. yo G3P3001 at [redacted]w[redacted]d was admitted in Active Labor on 03/04/2016. Patient had an uncomplicated labor course as follows:  Membrane Rupture Time/Date: 3:25 AM ,03/05/2016   Intrapartum Procedures: Episiotomy: None [1]                                         Lacerations:  Labial [10]  Patient had a delivery of a Viable infant. 03/05/2016  Information for the patient's newborn:  Racquelle, Hyser [[id number]]  Delivery Method: Vag-Spont    Pateint had an uncomplicated postpartum course.  She is ambulating, tolerating a regular diet, passing flatus, and urinating well. Patient is discharged home in stable condition on 03/06/2016.    Physical exam  Filed Vitals:   03/06/16 0107 03/06/16 0411 03/06/16 0736 03/06/16 1142  BP: 100/58 104/65 119/70 105/64  Pulse: 58 58 60 61  Temp: 98 F (36.7 C) 98.2 F (36.8 C) 97.8 F (36.6 C) 98 F (36.7 C)  TempSrc: Oral Oral Oral Oral  Resp: Height:      Weight:      SpO2: 97%  100%    General: alert, cooperative and no distress Lochia: appropriate Uterine Fundus: firm Incision: Healing well with no significant drainage DVT Evaluation: No evidence of DVT seen on physical exam. Labs: Lab Results  Component Value Date   WBC 13.8* 03/05/2016   HGB 10.2* 03/05/2016   HCT 31.8* 03/05/2016   MCV 80.1  03/05/2016   PLT 245 03/05/2016   CMP Latest Ref Rng 06/09/2014  Glucose 65-99 mg/dL 782(N)  BUN 5-62 mg/dL 7(L)  Creatinine 1.30-8.65 mg/dL 7.84  Sodium 696-295 mmol/L 138  Potassium 3.3-4.7 mmol/L 3.2(L)  Chloride 97-107 mmol/L 102  CO2 16-25 mmol/L 26(H)  Calcium 9.0-10.7 mg/dL 2.8(U)  Total Protein 1.3-2.4 g/dL 8.1  Total Bilirubin 4.0-1.0 mg/dL 1.0  Alkaline Phos - 69  AST 0-26 Unit/L 19  ALT - 13(L)    Discharge instruction: per After Visit Summary and "Baby and Me Booklet".  After visit meds:    Medication List    STOP taking these medications        amoxicillin 500 MG tablet  Commonly known as:  AMOXIL     HYDROcodone-acetaminophen 5-325 MG tablet  Commonly known as:  NORCO  TAKE these medications        acetaminophen 500 MG tablet  Commonly known as:  TYLENOL  Take 2 tablets (1,000 mg total) by mouth every 6 (six) hours as needed for mild pain or moderate pain.     ibuprofen 800 MG tablet  Commonly known as:  ADVIL,MOTRIN  Take 1 tablet (800 mg total) by mouth every 8 (eight) hours as needed.     prenatal multivitamin Tabs tablet  Take 1 tablet by mouth daily at 12 noon.        Diet: routine diet  Activity: Advance as tolerated. Pelvic rest for 6 weeks.   Outpatient follow up:6 weeks Follow up Appt:No future appointments. Follow up Visit:No Follow-up on file.  Postpartum contraception: Undecided  Newborn Data: Live born female  Birth Weight: 7 lb 5 oz (3317 g) APGAR: 8, 9  Baby Feeding: Bottle and Breast Disposition:home with mother   03/06/2016 Ala DachJohanna K Jaceyon Strole, MD

## 2016-07-24 ENCOUNTER — Encounter: Payer: Self-pay | Admitting: Emergency Medicine

## 2016-07-24 ENCOUNTER — Emergency Department
Admission: EM | Admit: 2016-07-24 | Discharge: 2016-07-24 | Disposition: A | Discharge status: Home / Self Care | Payer: Medicaid Other | Attending: Emergency Medicine | Admitting: Emergency Medicine

## 2016-07-24 DIAGNOSIS — R110 Nausea: Secondary | ICD-10-CM | POA: Insufficient documentation

## 2016-07-24 DIAGNOSIS — J0140 Acute pansinusitis, unspecified: Secondary | ICD-10-CM

## 2016-07-24 DIAGNOSIS — F17210 Nicotine dependence, cigarettes, uncomplicated: Secondary | ICD-10-CM | POA: Insufficient documentation

## 2016-07-24 MED ORDER — AZITHROMYCIN 250 MG PO TABS: ORAL_TABLET | ORAL | 0 refills | Status: DC

## 2016-07-24 MED ORDER — OXYCODONE-ACETAMINOPHEN 5-325 MG PO TABS
1.0000 | ORAL_TABLET | Freq: Once | ORAL | Status: AC
  Administered 2016-07-24: 1 via ORAL
  Filled 2016-07-24: qty 1

## 2016-07-24 MED ORDER — OXYCODONE-ACETAMINOPHEN 5-325 MG PO TABS: 1.0000 | ORAL_TABLET | ORAL | 0 refills | Status: DC | PRN

## 2016-07-24 MED ORDER — PREDNISONE 10 MG PO TABS: ORAL_TABLET | ORAL | 0 refills | Status: DC

## 2016-07-24 NOTE — ED Triage Notes (Signed)
C/o congestion and headache.  NAD

## 2016-07-24 NOTE — ED Provider Notes (Signed)
[facility] Emergency Department Provider Note  ____________________________________________   First MD Initiated Contact with Patient 07/24/16 1225     (approximate)  I have reviewed the triage vital signs and the nursing notes.   HISTORY  Chief Complaint Facial Pain   HPI Victoria Villarreal is a 20 y.o. female is here complaining of left-sided face and forehead pain. Patient states that this is continued for 3 days. Patient has not taken any over-the-counter medication including Tylenol or ibuprofen for her headache.  She reports congestion but denies cough, fever, nausea or vomiting. Patient states that she has had occasional nausea and the pain is worse when she leans forward. Patient continues to smoke one pack cigarettes per day. She denies any visual changes, previous headaches for history of migraines. Currently patient rates her pain as 10 over 10. Patient is not breast feeding.   Past Medical History:  Diagnosis Date  . Trichomoniasis     Patient Active Problem List   Diagnosis Date Noted  . Pregnancy 03/05/2016  . No leakage of amniotic fluid into vagina 12/14/2015    History reviewed. No pertinent surgical history.  Prior to Admission medications   Medication Sig Start Date End Date Taking? Authorizing Provider  azithromycin (ZITHROMAX Z-PAK) 250 MG tablet Take 2 tablets (500 mg) on  Day 1,  followed by 1 tablet (250 mg) once daily on Days 2 through 5. 07/24/16   Tommi Rumpshonda L Summers, PA-C  oxyCODONE-acetaminophen (PERCOCET) 5-325 MG tablet Take 1 tablet by mouth every 4 (four) hours as needed for severe pain. 07/24/16   Tommi Rumpshonda L Summers, PA-C  predniSONE (DELTASONE) 10 MG tablet Take 6 tablets  today, on day 2 take 5 tablets, day 3 take 4 tablets, day 4 take 3 tablets, day 5 take  2 tablets and 1 tablet the last day 07/24/16   Tommi Rumpshonda L Summers, PA-C  Prenatal Vit-Fe Fumarate-FA (PRENATAL MULTIVITAMIN) TABS tablet Take 1 tablet by mouth daily at 12  noon.    Historical Provider, MD    Allergies Review of patient's allergies indicates no known allergies.  No family history on file.  Social History Social History  Substance Use Topics  . Smoking status: Current Every Day Smoker    Packs/day: 0.50    Types: Cigarettes  . Smokeless tobacco: Never Used  . Alcohol use No    Review of Systems Constitutional: No fever/chills Eyes: No visual changes. ENT: No sore throat. Cardiovascular: Denies chest pain. Respiratory: Denies shortness of breath. Gastrointestinal: No abdominal pain.  Positive nausea, no vomiting.   Genitourinary: Negative for dysuria. Musculoskeletal: Negative for back pain. Skin: Negative for rash. Neurological: Positive for headaches, negative for focal weakness or numbness.  10-point ROS otherwise negative.  ____________________________________________   PHYSICAL EXAM:  VITAL SIGNS: ED Triage Vitals  Enc Vitals Group     BP 07/24/16 1212 123/78     Pulse Rate 07/24/16 1212 (!) 102     Resp 07/24/16 1212 20     Temp 07/24/16 1212 98.5 F (36.9 C)     Temp Source 07/24/16 1212 Oral     SpO2 07/24/16 1212 98 %     Weight 07/24/16 1213 157 lb (71.2 kg)     Height 07/24/16 1213 5\' 7"  (1.702 m)     Head Circumference --      Peak Flow --      Pain Score 07/24/16 1214 10     Pain Loc --      Pain  Edu? --      Excl. in GC? --     Constitutional: Alert and oriented. Well appearing and in no acute distress.Tearful. Infant child in the bed with patient. Eyes: Conjunctivae are normal. PERRL. EOMI. Head: Atraumatic. Moderate tenderness on percussion of the left frontal and maxillary sinus. Nose: Moderate congestion/no rhinnorhea. Mouth/Throat: Mucous membranes are moist.  Oropharynx non-erythematous. Positive posterior drainage. Neck: No stridor.   Hematological/Lymphatic/Immunilogical: No cervical lymphadenopathy. Cardiovascular: Normal rate, regular rhythm. Grossly normal heart sounds.  Good  peripheral circulation. Respiratory: Normal respiratory effort.  No retractions. Lungs CTAB. Musculoskeletal: Moves upper and lower extremities without any difficulty. Neurologic:  Normal speech and language. No gross focal neurologic deficits are appreciated. Cranial nerves II through XII grossly intact. No gait instability. Skin:  Skin is warm, dry and intact. No rash noted. Psychiatric: Mood and affect are normal. Speech and behavior are normal.  ____________________________________________   LABS (all labs ordered are listed, but only abnormal results are displayed)  Labs Reviewed - No data to display   PROCEDURES  Procedure(s) performed: None  Procedures  Critical Care performed: No  ____________________________________________   INITIAL IMPRESSION / ASSESSMENT AND PLAN / ED COURSE  Pertinent labs & imaging results that were available during my care of the patient were reviewed by me and considered in my medical decision making (see chart for details).    Clinical Course   Patient was given a prescription for Zithromax Pak, prednisone 60 mg 6 day taper and Percocet 5/325 one every 4 hours when necessary severe pain #12. Patient is follow-up with [facility] if any continued problems. She is also encouraged to discontinue smoking.  ____________________________________________   FINAL CLINICAL IMPRESSION(S) / ED DIAGNOSES  Final diagnoses:  Acute pansinusitis, recurrence not specified      NEW MEDICATIONS STARTED DURING THIS VISIT:  New Prescriptions   AZITHROMYCIN (ZITHROMAX Z-PAK) 250 MG TABLET    Take 2 tablets (500 mg) on  Day 1,  followed by 1 tablet (250 mg) once daily on Days 2 through 5.   OXYCODONE-ACETAMINOPHEN (PERCOCET) 5-325 MG TABLET    Take 1 tablet by mouth every 4 (four) hours as needed for severe pain.   PREDNISONE (DELTASONE) 10 MG TABLET    Take 6 tablets  today, on day 2 take 5 tablets, day 3 take 4 tablets, day 4 take 3 tablets, day 5  take  2 tablets and 1 tablet the last day     Note:  This document was prepared using Dragon voice recognition software and may include unintentional dictation errors.    Tommi Rumps, PA-C 07/24/16 1427    Jene Every, MD 07/24/16 [phone]

## 2016-07-24 NOTE — Discharge Instructions (Signed)
[facility]. Medication as directed. Zithromax as directed, prednisone as directed and Percocet as needed for severe pain. Increase fluids.  Discontinue smoking.

## 2016-07-24 NOTE — ED Notes (Signed)
Pt complains of left sided facial/head pain for 3 days, pt reports occasional nausea, pt denies any other symtpoms

## 2016-08-21 MED ORDER — LIDOCAINE-EPINEPHRINE (PF) 1 %-1:200000 IJ SOLN
INTRAMUSCULAR | Status: AC
  Filled 2016-08-21: qty 30

## 2017-02-23 ENCOUNTER — Encounter: Payer: Self-pay | Admitting: Emergency Medicine

## 2017-02-23 ENCOUNTER — Emergency Department
Admission: EM | Admit: 2017-02-23 | Discharge: 2017-02-23 | Disposition: A | Discharge status: Home / Self Care | Payer: Self-pay | Attending: Emergency Medicine | Admitting: Emergency Medicine

## 2017-02-23 DIAGNOSIS — R110 Nausea: Secondary | ICD-10-CM | POA: Insufficient documentation

## 2017-02-23 DIAGNOSIS — Z87891 Personal history of nicotine dependence: Secondary | ICD-10-CM | POA: Insufficient documentation

## 2017-02-23 DIAGNOSIS — N10 Acute tubulo-interstitial nephritis: Secondary | ICD-10-CM | POA: Insufficient documentation

## 2017-02-23 DIAGNOSIS — N12 Tubulo-interstitial nephritis, not specified as acute or chronic: Secondary | ICD-10-CM

## 2017-02-23 DIAGNOSIS — R509 Fever, unspecified: Secondary | ICD-10-CM | POA: Insufficient documentation

## 2017-02-23 LAB — URINALYSIS, COMPLETE (UACMP) WITH MICROSCOPIC
BILIRUBIN URINE: NEGATIVE
Glucose, UA: NEGATIVE mg/dL
HGB URINE DIPSTICK: NEGATIVE
Ketones, ur: NEGATIVE mg/dL
Nitrite: POSITIVE — AB
PH: 8 (ref 5.0–8.0)
Protein, ur: 100 mg/dL — AB
SPECIFIC GRAVITY, URINE: 1.016 (ref 1.005–1.030)

## 2017-02-23 LAB — COMPREHENSIVE METABOLIC PANEL
ALT: 11 U/L — AB (ref 14–54)
ANION GAP: 6 (ref 5–15)
AST: 14 U/L — ABNORMAL LOW (ref 15–41)
Albumin: 3.2 g/dL — ABNORMAL LOW (ref 3.5–5.0)
Alkaline Phosphatase: 46 U/L (ref 38–126)
BUN: 9 mg/dL (ref 6–20)
CALCIUM: 8.5 mg/dL — AB (ref 8.9–10.3)
CO2: 26 mmol/L (ref 22–32)
CREATININE: 0.79 mg/dL (ref 0.44–1.00)
Chloride: 106 mmol/L (ref 101–111)
Glucose, Bld: 137 mg/dL — ABNORMAL HIGH (ref 65–99)
Potassium: 3.7 mmol/L (ref 3.5–5.1)
Sodium: 138 mmol/L (ref 135–145)
Total Bilirubin: 0.5 mg/dL (ref 0.3–1.2)
Total Protein: 6.4 g/dL — ABNORMAL LOW (ref 6.5–8.1)

## 2017-02-23 LAB — CBC
HCT: 40.9 % (ref 35.0–47.0)
HEMOGLOBIN: 13.5 g/dL (ref 12.0–16.0)
MCH: 28.8 pg (ref 26.0–34.0)
MCHC: 33 g/dL (ref 32.0–36.0)
MCV: 87.1 fL (ref 80.0–100.0)
PLATELETS: 246 10*3/uL (ref 150–440)
RBC: 4.7 MIL/uL (ref 3.80–5.20)
RDW: 15.4 % — ABNORMAL HIGH (ref 11.5–14.5)
WBC: 13.7 10*3/uL — AB (ref 3.6–11.0)

## 2017-02-23 LAB — POCT PREGNANCY, URINE: Preg Test, Ur: NEGATIVE

## 2017-02-23 LAB — LIPASE, BLOOD: LIPASE: 15 U/L (ref 11–51)

## 2017-02-23 MED ORDER — IBUPROFEN 600 MG PO TABS: ORAL_TABLET | ORAL | 0 refills | Status: DC

## 2017-02-23 MED ORDER — PHENAZOPYRIDINE HCL 95 MG PO TABS: 190.0000 mg | ORAL_TABLET | Freq: Three times a day (TID) | ORAL | 0 refills | Status: DC | PRN

## 2017-02-23 MED ORDER — CEPHALEXIN 500 MG PO CAPS
500.0000 mg | ORAL_CAPSULE | Freq: Three times a day (TID) | ORAL | 0 refills | Status: DC
Start: 2017-02-23 — End: 2017-08-09

## 2017-02-23 MED ORDER — ONDANSETRON HCL 4 MG/2ML IJ SOLN
4.0000 mg | INTRAMUSCULAR | Status: AC
  Administered 2017-02-23: 4 mg via INTRAVENOUS
  Filled 2017-02-23: qty 2

## 2017-02-23 MED ORDER — OXYCODONE-ACETAMINOPHEN 5-325 MG PO TABS
1.0000 | ORAL_TABLET | ORAL | Status: DC | PRN
  Administered 2017-02-23: 1 via ORAL
  Filled 2017-02-23: qty 1

## 2017-02-23 MED ORDER — CEFAZOLIN SODIUM-DEXTROSE 1-4 GM/50ML-% IV SOLN
INTRAVENOUS | Status: AC
  Administered 2017-02-23: 1 g
  Filled 2017-02-23: qty 50

## 2017-02-23 MED ORDER — DEXTROSE 5 % IV SOLN: 1.0000 g | INTRAVENOUS | Status: DC

## 2017-02-23 MED ORDER — ACETAMINOPHEN 325 MG PO TABS
ORAL_TABLET | ORAL | Status: AC
  Administered 2017-02-23: 325 mg via ORAL
  Filled 2017-02-23: qty 1

## 2017-02-23 MED ORDER — ONDANSETRON 4 MG PO TBDP: ORAL_TABLET | ORAL | 0 refills | Status: DC

## 2017-02-23 MED ORDER — ACETAMINOPHEN 325 MG PO TABS
325.0000 mg | ORAL_TABLET | Freq: Once | ORAL | Status: AC
  Administered 2017-02-23: 325 mg via ORAL

## 2017-02-23 MED ORDER — CEFTRIAXONE SODIUM-DEXTROSE 1-3.74 GM-% IV SOLR
1.0000 g | Freq: Once | INTRAVENOUS | Status: DC
  Filled 2017-02-23: qty 50

## 2017-02-23 MED ORDER — SODIUM CHLORIDE 0.9 % IV BOLUS (SEPSIS)
1000.0000 mL | INTRAVENOUS | Status: AC
  Administered 2017-02-23: 1000 mL via INTRAVENOUS

## 2017-02-23 MED ORDER — MORPHINE SULFATE (PF) 4 MG/ML IV SOLN
4.0000 mg | Freq: Once | INTRAVENOUS | Status: AC
  Administered 2017-02-23: 4 mg via INTRAVENOUS
  Filled 2017-02-23: qty 1

## 2017-02-23 MED ORDER — ONDANSETRON 4 MG PO TBDP
4.0000 mg | ORAL_TABLET | Freq: Once | ORAL | Status: AC | PRN
  Administered 2017-02-23: 4 mg via ORAL
  Filled 2017-02-23: qty 1

## 2017-02-23 NOTE — ED Triage Notes (Signed)
Patient to ER for pain to RLQ and right flank. Patient in tears in triage, walked slowly into exam room. Denies any urinary symptoms. States LMP was 4 weeks ago. States pain began 3 days ago and has gotten worse.

## 2017-02-23 NOTE — ED Provider Notes (Signed)
[facility] Emergency Department Provider Note  ____________________________________________   First MD Initiated Contact with Patient 02/23/17 2200     (approximate)  I have reviewed the triage vital signs and the nursing notes.   HISTORY  Chief Complaint Abdominal Pain and Flank Pain    HPI Victoria Villarreal is a 21 y.o. female who presents for evaluation of acute onset right flank pain about 2 days ago.  It has been persistent and is currently severe, sharp, stabbing.  It radiates down into her right lower quadrant.  She has also had increased urinary frequency and reports that it hurts when she holds her urine.  She has no history of kidney stones, nor does any member of her immediate family (her mother is in the emergency department with her).She has not seen any blood in her urine.  She reports some nausea and dry heaves.  She has had some low-grade fever of around 100-101.  She does not appear to be in any acute distress at this time and is awake, alert, and laughing and joking with me, but states her symptoms are severe earlier.  She denies chest pain and shortness of breath.   Past Medical History:  Diagnosis Date  . Trichomoniasis     Patient Active Problem List   Diagnosis Date Noted  . Pregnancy 03/05/2016  . No leakage of amniotic fluid into vagina 12/14/2015    History reviewed. No pertinent surgical history.  Prior to Admission medications   Medication Sig Start Date End Date Taking? Authorizing Provider  azithromycin (ZITHROMAX Z-PAK) 250 MG tablet Take 2 tablets (500 mg) on  Day 1,  followed by 1 tablet (250 mg) once daily on Days 2 through 5. 07/24/16   Tommi Rumps, PA-C  cephALEXin (KEFLEX) 500 MG capsule Take 1 capsule (500 mg total) by mouth 3 (three) times daily. 02/23/17   Loleta Rose, MD  ibuprofen (ADVIL,MOTRIN) 600 MG tablet Take 1 tablet by mouth three times daily with meals 02/23/17   Loleta Rose, MD  ondansetron  (ZOFRAN ODT) 4 MG disintegrating tablet Allow 1-2 tablets to dissolve in your mouth every 8 hours as needed for nausea/vomiting 02/23/17   Loleta Rose, MD  oxyCODONE-acetaminophen (PERCOCET) 5-325 MG tablet Take 1 tablet by mouth every 4 (four) hours as needed for severe pain. 07/24/16   Tommi Rumps, PA-C  phenazopyridine (PYRIDIUM) 95 MG tablet Take 2 tablets (190 mg total) by mouth 3 (three) times daily with meals as needed for pain (bladder pain/spasms). 02/23/17   Loleta Rose, MD  predniSONE (DELTASONE) 10 MG tablet Take 6 tablets  today, on day 2 take 5 tablets, day 3 take 4 tablets, day 4 take 3 tablets, day 5 take  2 tablets and 1 tablet the last day 07/24/16   Tommi Rumps, PA-C  Prenatal Vit-Fe Fumarate-FA (PRENATAL MULTIVITAMIN) TABS tablet Take 1 tablet by mouth daily at 12 noon.    [provider]    Allergies Hydrocodone and Tramadol  No family history on file.  Social History Social History  Substance Use Topics  . Smoking status: Former Smoker    Packs/day: 0.50    Types: Cigarettes  . Smokeless tobacco: Never Used  . Alcohol use No    Review of Systems Constitutional: +fever/chills Eyes: No visual changes. ENT: No sore throat. Cardiovascular: Denies chest pain. Respiratory: Denies shortness of breath. Gastrointestinal: Right flank pain radiating to RLQ.  Nausea and vomiting.  No diarrhea nor constipation. Genitourinary: Occasional  dysuria, increased urinary frequency, pain "when I hold it".  No hematuria. Musculoskeletal: Right flank pain Integumentary: Negative for rash. Neurological: Negative for headaches, focal weakness or numbness.   ____________________________________________   PHYSICAL EXAM:  VITAL SIGNS: ED Triage Vitals  Enc Vitals Group     BP 02/23/17 2057 (!) 178/155     Pulse Rate 02/23/17 2057 (!) 108     Resp 02/23/17 2057 (!) 22     Temp 02/23/17 2057 (!) 100.9 F (38.3 C)     Temp Source 02/23/17 2057 Oral      SpO2 02/23/17 2057 97 %     Weight 02/23/17 2057 154 lb (69.9 kg)     Height 02/23/17 2057 5\' 7"  (1.702 m)     Head Circumference --      Peak Flow --      Pain Score 02/23/17 2056 10     Pain Loc --      Pain Edu? --      Excl. in GC? --     Constitutional: Alert and oriented. Well appearing and in no acute distress. Eyes: Conjunctivae are normal. PERRL. EOMI. Head: Atraumatic. Nose: No congestion/rhinnorhea. Mouth/Throat: Mucous membranes are moist. Neck: No stridor.  No meningeal signs.   Cardiovascular: Borderline tachycardia, regular rhythm. Good peripheral circulation. Grossly normal heart sounds. Respiratory: Normal respiratory effort.  No retractions. Lungs CTAB. Gastrointestinal: Soft and nontender. Moderate right CVA tenderness. Genitourinary: Deferred Musculoskeletal: No lower extremity tenderness nor edema. No gross deformities of extremities. Neurologic:  Normal speech and language. No gross focal neurologic deficits are appreciated.  Skin:  Skin is warm, dry and intact. No rash noted. Psychiatric: Mood and affect are normal. Speech and behavior are normal.  ____________________________________________   LABS (all labs ordered are listed, but only abnormal results are displayed)  Labs Reviewed  COMPREHENSIVE METABOLIC PANEL - Abnormal; Notable for the following:       Result Value   Glucose, Bld 137 (*)    Calcium 8.5 (*)    Total Protein 6.4 (*)    Albumin 3.2 (*)    AST 14 (*)    ALT 11 (*)    All other components within normal limits  CBC - Abnormal; Notable for the following:    WBC 13.7 (*)    RDW 15.4 (*)    All other components within normal limits  URINALYSIS, COMPLETE (UACMP) WITH MICROSCOPIC - Abnormal; Notable for the following:    Color, Urine YELLOW (*)    APPearance CLOUDY (*)    Protein, ur 100 (*)    Nitrite POSITIVE (*)    Leukocytes, UA MODERATE (*)    Bacteria, UA MANY (*)    Squamous Epithelial / LPF 0-5 (*)    All other components  within normal limits  URINE CULTURE  LIPASE, BLOOD  POC URINE PREG, ED  POCT PREGNANCY, URINE   ____________________________________________  EKG  None - EKG not ordered by ED physician ____________________________________________  RADIOLOGY   No results found.  ____________________________________________   PROCEDURES  Critical Care performed: No   Procedure(s) performed:   Procedures   ____________________________________________   INITIAL IMPRESSION / ASSESSMENT AND PLAN / ED COURSE  Pertinent labs & imaging results that were available during my care of the patient were reviewed by me and considered in my medical decision making (see chart for details).  I had a lengthy discussion with the patient and her mother about whether or not to obtain a CT scan to rule out kidney  stones as opposed to verify and pyelonephritis, I believe clinically she has.  However, given no history of kidney stones and no family history of kidney stones, her age, and a strongly positive urinalysis, I think it would be extremely unlikely that on top of the pyelonephritis she also has a kidney stone.  I feel that the risks associated with unnecessary abdominal CT scans for a young and otherwise healthy woman outweigh the low possibility that she has an infected stone.  I am treating with ceftriaxone 1 g IV, morphine, Zofran, and 1 L of fluids.  I have written her a prescription for Keflex, Zofran, ibuprofen to take with meals, and Pyridium.  I gave my usual and customary return precautions.  She and her mother are comfortable with the plan.      ____________________________________________  FINAL CLINICAL IMPRESSION(S) / ED DIAGNOSES  Final diagnoses:  Pyelonephritis     MEDICATIONS GIVEN DURING THIS VISIT:  Medications  oxyCODONE-acetaminophen (PERCOCET/ROXICET) 5-325 MG per tablet 1 tablet (1 tablet Oral Given 02/23/17 2108)  cefTRIAXone (ROCEPHIN) IVPB 1 g (not administered)    ondansetron (ZOFRAN-ODT) disintegrating tablet 4 mg (4 mg Oral Given 02/23/17 2109)  acetaminophen (TYLENOL) tablet 325 mg (325 mg Oral Given 02/23/17 2108)  morphine 4 MG/ML injection 4 mg (4 mg Intravenous Given 02/23/17 2243)  ondansetron (ZOFRAN) injection 4 mg (4 mg Intravenous Given 02/23/17 2243)  ceFAZolin (ANCEF) 1-4 GM/50ML-% IVPB (1 g  New Bag/Given 02/23/17 2243)  sodium chloride 0.9 % bolus 1,000 mL (1,000 mLs Intravenous New Bag/Given 02/23/17 2250)     NEW OUTPATIENT MEDICATIONS STARTED DURING THIS VISIT:  New Prescriptions   CEPHALEXIN (KEFLEX) 500 MG CAPSULE    Take 1 capsule (500 mg total) by mouth 3 (three) times daily.   IBUPROFEN (ADVIL,MOTRIN) 600 MG TABLET    Take 1 tablet by mouth three times daily with meals   ONDANSETRON (ZOFRAN ODT) 4 MG DISINTEGRATING TABLET    Allow 1-2 tablets to dissolve in your mouth every 8 hours as needed for nausea/vomiting   PHENAZOPYRIDINE (PYRIDIUM) 95 MG TABLET    Take 2 tablets (190 mg total) by mouth 3 (three) times daily with meals as needed for pain (bladder pain/spasms).    Modified Medications   No medications on file    Discontinued Medications   No medications on file     Note:  This document was prepared using Dragon voice recognition software and may include unintentional dictation errors.    Loleta RoseForbach, Emonee Winkowski, MD 02/23/17 2250

## 2017-02-23 NOTE — Discharge Instructions (Signed)
Your workup today suggests that you have a urinary tract infection (UTI) which has spread to your kidneys. ° °Please take your antibiotic as prescribed and over-the-counter pain medication (Tylenol or Motrin) as needed, but no more than recommended on the label instructions.  Drink PLENTY of fluids. ° °Take Norco as prescribed for severe pain. Do not drink alcohol, drive or participate in any other potentially dangerous activities while taking this medication as it may make you sleepy. Do not take this medication with any other sedating medications, either prescription or over-the-counter. If you were prescribed Percocet or Vicodin, do not take these with acetaminophen (Tylenol) as it is already contained within these medications. °  °This medication is an opiate (or narcotic) pain medication and can be habit forming.  Use it as little as possible to achieve adequate pain control.  Do not use or use it with extreme caution if you have a history of opiate abuse or dependence.  If you are on a pain contract with your primary care doctor or a pain specialist, be sure to let them know you were prescribed this medication today from the [facility].  This medication is intended for your use only - do not give any to anyone else and keep it in a secure place where nobody else, especially children, have access to it.  It will also cause or worsen constipation, so you may want to consider taking an over-the-counter stool softener while you are taking this medication. ° °Call your regular doctor to schedule the next available appointment to follow up on today’s ED visit, or return immediately to the ED if your pain worsens, you have decreased urine production, develop fever, persistent vomiting, or other symptoms that concern you. ° °

## 2017-02-23 NOTE — ED Notes (Signed)
Pt c/o 10/10 pain on the right abdomen and coresponding flank x3 days; pt states pain first started 3 weeks ago, then went away and came back 3 days ago; pt denies any vomiting or diarrhea, she does c/o nausea; pt states having a low grade fever below 100 degrees; pt also states feeling "like I'm about to pass out" x2 yesterday. At this time, pt awake, alert and oriented x4, and able to speak in complete sentences.

## 2017-02-26 LAB — URINE CULTURE: SPECIAL REQUESTS: NORMAL

## 2017-02-27 NOTE — Progress Notes (Signed)
21 y/o F d/c from ED 5/15 on Keflex for pyelonephritis. Urine culture returned with ESBL E coli. Dr. Sharman CheekPhillip Stafford authorized ciprofloxacin 500 mg bid x 7 days and d/c Keflex. Left VM on mobile phones for patient and spouse.   Luisa HartScott Alizabeth Antonio, PharmD Clinical Pharmacist

## 2017-08-09 ENCOUNTER — Encounter: Payer: Self-pay | Admitting: *Deleted

## 2017-08-09 ENCOUNTER — Emergency Department
Admission: EM | Admit: 2017-08-09 | Discharge: 2017-08-09 | Disposition: A | Discharge status: Home / Self Care | Payer: Medicaid Other | Attending: Emergency Medicine | Admitting: Emergency Medicine

## 2017-08-09 DIAGNOSIS — Z3201 Encounter for pregnancy test, result positive: Secondary | ICD-10-CM | POA: Insufficient documentation

## 2017-08-09 DIAGNOSIS — Z79899 Other long term (current) drug therapy: Secondary | ICD-10-CM | POA: Diagnosis not present

## 2017-08-09 DIAGNOSIS — Z3490 Encounter for supervision of normal pregnancy, unspecified, unspecified trimester: Secondary | ICD-10-CM | POA: Diagnosis not present

## 2017-08-09 DIAGNOSIS — Z87891 Personal history of nicotine dependence: Secondary | ICD-10-CM | POA: Diagnosis not present

## 2017-08-09 LAB — URINALYSIS, COMPLETE (UACMP) WITH MICROSCOPIC
Bilirubin Urine: NEGATIVE
Glucose, UA: NEGATIVE mg/dL
Hgb urine dipstick: NEGATIVE
Ketones, ur: NEGATIVE mg/dL
Leukocytes, UA: NEGATIVE
NITRITE: POSITIVE — AB
PH: 6 (ref 5.0–8.0)
Protein, ur: NEGATIVE mg/dL
Specific Gravity, Urine: 1.004 — ABNORMAL LOW (ref 1.005–1.030)

## 2017-08-09 LAB — POCT PREGNANCY, URINE: PREG TEST UR: POSITIVE — AB

## 2017-08-09 NOTE — ED Triage Notes (Signed)
States a positive home pregnancy test at home yesterday, states she wants to "confirm" if she is pregnant or not and does not want to pay $10 at the health dept, denies any vaginal bleeding or discharge, awake and alert, LMP 9/27

## 2017-08-09 NOTE — ED Notes (Signed)
PA at bedside to assess pt.

## 2017-08-09 NOTE — ED Provider Notes (Signed)
[facility] Emergency Department Provider Note   ____________________________________________   First MD Initiated Contact with Patient 08/09/17 1221     (approximate)  I have reviewed the triage vital signs and the nursing notes.   HISTORY  Chief Complaint wants to confirm pregnancy   HPI Victoria Villarreal is a 21 y.o. female she is requesting a pregnancy test to confirm her pregnancy. Patient took a home pregnancy test which was positive. She states that she needs a piece of paper to confirm this so that she may be seen at the health Department. She denies any vaginal bleeding or discharge. She denies any urinary symptoms.she voices that she is only here for the piece paper.   Past Medical History:  Diagnosis Date  . Trichomoniasis     Patient Active Problem List   Diagnosis Date Noted  . Pregnancy 03/05/2016  . No leakage of amniotic fluid into vagina 12/14/2015    History reviewed. No pertinent surgical history.  Prior to Admission medications   Medication Sig Start Date End Date Taking? Authorizing Provider  ondansetron (ZOFRAN ODT) 4 MG disintegrating tablet Allow 1-2 tablets to dissolve in your mouth every 8 hours as needed for nausea/vomiting 02/23/17   Loleta RoseForbach, Cory, MD  Prenatal Vit-Fe Fumarate-FA (PRENATAL MULTIVITAMIN) TABS tablet Take 1 tablet by mouth daily at 12 noon.    [provider]    Allergies Hydrocodone and Tramadol  History reviewed. No pertinent family history.  Social History Social History  Substance Use Topics  . Smoking status: Former Smoker    Packs/day: 0.50    Types: Cigarettes  . Smokeless tobacco: Never Used  . Alcohol use No    Review of Systems Constitutional: No fever/chills Gastrointestinal: No abdominal pain.  No nausea, no vomiting.   Genitourinary: LMP 9/27   Negative for urinary symptoms. Negative for vaginal discharge. Musculoskeletal: Negative for back pain. Skin: Negative for  rash. ____________________________________________   PHYSICAL EXAM:  VITAL SIGNS: ED Triage Vitals  Enc Vitals Group     BP 08/09/17 1143 107/74     Pulse Rate 08/09/17 1143 90     Resp 08/09/17 1143 18     Temp 08/09/17 1143 98.5 F (36.9 C)     Temp Source 08/09/17 1143 Oral     SpO2 08/09/17 1143 99 %     Weight 08/09/17 1142 155 lb (70.3 kg)     Height 08/09/17 1142 5\' 7"  (1.702 m)     Head Circumference --      Peak Flow --      Pain Score 08/09/17 1148 6     Pain Loc --      Pain Edu? --      Excl. in GC? --    Constitutional: Alert and oriented. Well appearing and in no acute distress. Eyes: Conjunctivae are normal.  Head: Atraumatic. Neck: No stridor.   Cardiovascular: Normal rate, regular rhythm. Grossly normal heart sounds.  Good peripheral circulation. Respiratory: Normal respiratory effort.  No retractions. Lungs CTAB. Psychiatric: Mood and affect are normal. Speech and behavior are normal. ____________________________________________   LABS (all labs ordered are listed, but only abnormal results are displayed)  Labs Reviewed  URINALYSIS, COMPLETE (UACMP) WITH MICROSCOPIC - Abnormal; Notable for the following:       Result Value   Color, Urine STRAW (*)    APPearance HAZY (*)    Specific Gravity, Urine 1.004 (*)    Nitrite POSITIVE (*)    Bacteria, UA MANY (*)  Squamous Epithelial / LPF 0-5 (*)    All other components within normal limits  POCT PREGNANCY, URINE - Abnormal; Notable for the following:    Preg Test, Ur POSITIVE (*)    All other components within normal limits  URINE CULTURE  POC URINE PREG, ED   PROCEDURES  Procedure(s) performed: None  Procedures  Critical Care performed: No  ____________________________________________   INITIAL IMPRESSION / ASSESSMENT AND PLAN / ED COURSE   Test results were given to the patient to take to the health department. She is encouraged to get prenatal  care.  ____________________________________________   FINAL CLINICAL IMPRESSION(S) / ED DIAGNOSES  Final diagnoses:  Pregnancy, unspecified gestational age      NEW MEDICATIONS STARTED DURING THIS VISIT:  Discharge Medication List as of 08/09/2017 12:55 PM       Note:  This document was prepared using Dragon voice recognition software and may include unintentional dictation errors.    Tommi Rumps, PA-C 08/09/17 1616    Arnaldo Natal, MD 08/21/17 [phone]

## 2017-08-09 NOTE — Discharge Instructions (Signed)
You were given a copy of your paper stating that you are positive for pregnancy. Please make arrangements for prenatal care.

## 2017-08-09 NOTE — ED Notes (Signed)
POC urine preg POSITIVE 

## 2017-08-11 LAB — URINE CULTURE
Culture: >=100000 — AB
Special Requests: NORMAL

## 2017-08-12 NOTE — Progress Notes (Signed)
ED Culture Report follow up shows >100 K ESBL E. Coli. Briefly, patient came to ED to confirm pregnancy so she could be seen at the health department. Pregnancy was confirmed by urine screening. She complained of no urinary symptoms.   Recommended fosfomycin 3 gm po Q72 hr x 3 days to Dr. Don PerkingVeronese, who accepts recommendation. She tells me if patient has worsened, developed flank pain or fever, she should return to the ED for IV antibiotics.   Called patient once at 12:46 08/12/17 and left message for her to call back. Called again at 13:51 and spoke with Ms. Lilian KapurMcdonald. She understands urine culture showed infection that needs to be treated. She says she is feeling fine with no symptoms - denies flank pain or fever. I discussed directions for use and answered her questions. She prefers [facility] on [address] in [place].   Spoke with Imagene Gurneyebecca RPh at [facility] [phone]). Fosfomycin 3 gm po Q72H x 3 doses - to take doses 08/12/17, 08/15/17, and 08/18/17. No refills. She will start preparing to dispense.   Rue Tinnel A. [place], [place].D., BCPS Clinical Pharmacist 08/12/2017 12:49

## 2017-08-17 ENCOUNTER — Other Ambulatory Visit: Payer: Self-pay

## 2017-08-17 ENCOUNTER — Emergency Department: Payer: Medicaid Other

## 2017-08-17 ENCOUNTER — Emergency Department
Admission: EM | Admit: 2017-08-17 | Discharge: 2017-08-17 | Disposition: A | Discharge status: Home / Self Care | Payer: Medicaid Other | Attending: Emergency Medicine | Admitting: Emergency Medicine

## 2017-08-17 ENCOUNTER — Encounter: Payer: Self-pay | Admitting: Emergency Medicine

## 2017-08-17 DIAGNOSIS — S93402A Sprain of unspecified ligament of left ankle, initial encounter: Secondary | ICD-10-CM | POA: Insufficient documentation

## 2017-08-17 DIAGNOSIS — Y9389 Activity, other specified: Secondary | ICD-10-CM | POA: Insufficient documentation

## 2017-08-17 DIAGNOSIS — W108XXA Fall (on) (from) other stairs and steps, initial encounter: Secondary | ICD-10-CM | POA: Insufficient documentation

## 2017-08-17 DIAGNOSIS — O9A211 Injury, poisoning and certain other consequences of external causes complicating pregnancy, first trimester: Secondary | ICD-10-CM | POA: Insufficient documentation

## 2017-08-17 DIAGNOSIS — Z79899 Other long term (current) drug therapy: Secondary | ICD-10-CM | POA: Diagnosis not present

## 2017-08-17 DIAGNOSIS — Z87891 Personal history of nicotine dependence: Secondary | ICD-10-CM | POA: Insufficient documentation

## 2017-08-17 DIAGNOSIS — Y998 Other external cause status: Secondary | ICD-10-CM | POA: Insufficient documentation

## 2017-08-17 DIAGNOSIS — Z3A01 Less than 8 weeks gestation of pregnancy: Secondary | ICD-10-CM | POA: Insufficient documentation

## 2017-08-17 DIAGNOSIS — Y92009 Unspecified place in unspecified non-institutional (private) residence as the place of occurrence of the external cause: Secondary | ICD-10-CM | POA: Insufficient documentation

## 2017-08-17 MED ORDER — ACETAMINOPHEN 325 MG PO TABS
650.0000 mg | ORAL_TABLET | Freq: Once | ORAL | Status: AC
  Administered 2017-08-17: 650 mg via ORAL
  Filled 2017-08-17: qty 2

## 2017-08-17 NOTE — ED Provider Notes (Signed)
[facility] Emergency Department Provider Note ____________________________________________  Time seen: Approximately 5:20 PM  I have reviewed the triage vital signs and the nursing notes.   HISTORY  Chief Complaint Ankle Pain    HPI Victoria Villarreal is a 21 y.o. female who presents to the emergency department for evaluation of left ankle pain. She states that while going up a step in her home last night, her foot got caught and twisted. She now has pain and swelling in the ankle. She reports that she is approximately [redacted] weeks pregnant and has not taken any medications or attempted any alleviating measures. She denies history of ankle sprain or fracture.  Past Medical History:  Diagnosis Date  . Trichomoniasis     Patient Active Problem List   Diagnosis Date Noted  . Pregnancy 03/05/2016  . No leakage of amniotic fluid into vagina 12/14/2015    History reviewed. No pertinent surgical history.  Prior to Admission medications   Medication Sig Start Date End Date Taking? Authorizing Provider  ondansetron (ZOFRAN ODT) 4 MG disintegrating tablet Allow 1-2 tablets to dissolve in your mouth every 8 hours as needed for nausea/vomiting 02/23/17   Loleta RoseForbach, Cory, MD  Prenatal Vit-Fe Fumarate-FA (PRENATAL MULTIVITAMIN) TABS tablet Take 1 tablet by mouth daily at 12 noon.    [provider]    Allergies Hydrocodone and Tramadol  No family history on file.  Social History Social History   Tobacco Use  . Smoking status: Former Smoker    Packs/day: 0.50    Types: Cigarettes  . Smokeless tobacco: Never Used  Substance Use Topics  . Alcohol use: No  . Drug use: No    Review of Systems Constitutional: Negative for recent illness. Cardiovascular: Negative for chest pain Respiratory: Negative for shortness breath Musculoskeletal: Positive for left ankle pain Skin: Negative for rash, lesion, or when.  Neurological: Negative for  paresthesias  ____________________________________________   PHYSICAL EXAM:  VITAL SIGNS: ED Triage Vitals  Enc Vitals Group     BP 08/17/17 1701 126/80     Pulse Rate 08/17/17 1701 86     Resp 08/17/17 1701 20     Temp 08/17/17 1701 98.4 F (36.9 C)     Temp Source 08/17/17 1701 Oral     SpO2 08/17/17 1701 100 %     Weight 08/17/17 1701 160 lb (72.6 kg)     Height 08/17/17 1701 5\' 7"  (1.702 m)     Head Circumference --      Peak Flow --      Pain Score 08/17/17 1700 10     Pain Loc --      Pain Edu? --      Excl. in GC? --     Constitutional: Alert and oriented. Well appearing and in no acute distress. Eyes: Pupils equal, sclera is clear.  Head: Atraumatic. Neck: Full, active range of motion observed. Respiratory: Respirations even and unlabored. Musculoskeletal: Left Achilles demonstrates negative Thompson's test. ATFL pattern edema of the left ankle. No obvious deformity. Full, active range of motion of toes of the left foot. Neurologic: Sensation is intact, specifically over the left foot and ankle.  Skin: No rash, lesion, or wound noted about the left foot and ankle.  Psychiatric: Mood, affect, and behavior appears appropriate.  ____________________________________________   LABS (all labs ordered are listed, but only abnormal results are displayed)  Labs Reviewed - No data to display ____________________________________________  RADIOLOGY  Left ankle without acute bony abnormality per  radiology. ____________________________________________   PROCEDURES  Procedure(s) performed: Ankle stirrup splint applied by Allayne StackJann, ERT and crutches with training completed.  ____________________________________________   INITIAL IMPRESSION / ASSESSMENT AND PLAN / ED COURSE  Victoria Villarreal is a 21 y.o. female who presents to the emergency department for treatment and evaluation of left ankle pain after sustaining an injury at her home last night. When she is negative  for acute bony abnormality. Ankle stirrup splint was applied by ER tech and crutches were given. Because she is pregnant, she was encouraged to only take Tylenol as directed and rest, ice, and elevate her foot. She was encouraged to follow-up with podiatry if she is not improving over a week or so. She was instructed to return to the emergency department for symptoms that change or worsen or for new concerns if she is unable to see the specialist or her primary care provider.  Pertinent labs & imaging results that were available during my care of the patient were reviewed by me and considered in my medical decision making (see chart for details).  _________________________________________   FINAL CLINICAL IMPRESSION(S) / ED DIAGNOSES  Final diagnoses:  Sprain of left ankle, unspecified ligament, initial encounter    This SmartLink is deprecated. Use AVSMEDLIST instead to display the medication list for a patient.  If controlled substance prescribed during this visit, 12 month history viewed on the [facility] prior to issuing an initial prescription for Schedule II or III opiod.    Chinita Pesterriplett, Milt Coye B, FNP 08/17/17 1904    Rockne MenghiniNorman, Anne-Caroline, MD 08/17/17 [phone]

## 2017-08-17 NOTE — ED Triage Notes (Signed)
Patient reports tripping last night and hurting left ankle. States pain and swelling worse today and unable to bear weight at this time. Swelling noted to left ankle.

## 2017-08-17 NOTE — Discharge Instructions (Signed)
Follow up with the podiatrist for symptoms that are not improving over the week. Take Tylenol if needed for pain. Keep the foot elevated as often as possible for the next 3 days. Return to the ER for symptoms that change or worsen if you are unable to schedule an appointment.

## 2017-09-21 ENCOUNTER — Other Ambulatory Visit: Payer: Self-pay | Admitting: Obstetrics and Gynecology

## 2017-09-21 DIAGNOSIS — Z369 Encounter for antenatal screening, unspecified: Secondary | ICD-10-CM

## 2017-09-30 ENCOUNTER — Ambulatory Visit: Payer: Medicaid Other

## 2017-10-12 NOTE — L&D Delivery Note (Signed)
Delivery Note At 6:21 PM a viable female was delivered via Vaginal, Spontaneous (Presentation:OA).  APGAR: 8, 9; weight 6 lb 15.8 oz (3170 g).   Placenta status:SDOP intact, no mec staining seen. Pt was writhing around screaming prior to birth and I placed an IFSE on to record the FHR as it was not recording due to pt's behaviour. She was upset as anesthesia was tied up and could not do her epidural. Her cx was checked and C/C/+2 station and enc to push. Cord:  with the following complications:   CAN x 1 reduced and del of ant and post shoulder and body followed completely and to mom's abd. Baby cried and CCx2 and delayed cord clamping x 3 mins. Cord blood collected.  Anesthesia: None  Episiotomy: None Lacerations: Perineal Tiny first deg on Lt perineum non-repaired and non-bleeding Suture Repair: None Est. Blood Loss (mL): 139 ml's  Mom to PP recovery.  Baby to skin to skin bonding  Myrtie Cruisearon W. Avya Flavell,RN, MSN, CNM, FNP Certified Nurse Midwife [facility] Sharee Pimplearon W Lianah Peed 04/01/2018, 7:24 PM

## 2017-10-13 LAB — OB RESULTS CONSOLE VARICELLA ZOSTER ANTIBODY, IGG: Varicella: IMMUNE

## 2017-10-13 LAB — OB RESULTS CONSOLE HEPATITIS B SURFACE ANTIGEN: Hepatitis B Surface Ag: NEGATIVE

## 2017-10-13 LAB — OB RESULTS CONSOLE GC/CHLAMYDIA
Chlamydia: NEGATIVE
GC PROBE AMP, GENITAL: NEGATIVE

## 2017-10-13 LAB — OB RESULTS CONSOLE RPR: RPR: NONREACTIVE

## 2017-10-13 LAB — OB RESULTS CONSOLE ABO/RH: RH TYPE: POSITIVE

## 2017-10-13 LAB — OB RESULTS CONSOLE HIV ANTIBODY (ROUTINE TESTING): HIV: NONREACTIVE

## 2017-10-13 LAB — OB RESULTS CONSOLE RUBELLA ANTIBODY, IGM: Rubella: IMMUNE

## 2017-12-20 ENCOUNTER — Other Ambulatory Visit: Payer: Self-pay

## 2017-12-20 ENCOUNTER — Observation Stay
Admission: EM | Admit: 2017-12-20 | Discharge: 2017-12-20 | Disposition: A | Discharge status: Home / Self Care | Payer: Medicaid Other | Attending: Obstetrics & Gynecology | Admitting: Obstetrics & Gynecology

## 2017-12-20 ENCOUNTER — Encounter: Payer: Self-pay | Admitting: Obstetrics and Gynecology

## 2017-12-20 DIAGNOSIS — F17210 Nicotine dependence, cigarettes, uncomplicated: Secondary | ICD-10-CM | POA: Insufficient documentation

## 2017-12-20 DIAGNOSIS — Z885 Allergy status to narcotic agent status: Secondary | ICD-10-CM | POA: Insufficient documentation

## 2017-12-20 DIAGNOSIS — O218 Other vomiting complicating pregnancy: Secondary | ICD-10-CM | POA: Diagnosis not present

## 2017-12-20 DIAGNOSIS — R300 Dysuria: Secondary | ICD-10-CM | POA: Diagnosis not present

## 2017-12-20 DIAGNOSIS — Z3490 Encounter for supervision of normal pregnancy, unspecified, unspecified trimester: Secondary | ICD-10-CM

## 2017-12-20 DIAGNOSIS — Z888 Allergy status to other drugs, medicaments and biological substances status: Secondary | ICD-10-CM | POA: Insufficient documentation

## 2017-12-20 DIAGNOSIS — Z3A23 23 weeks gestation of pregnancy: Secondary | ICD-10-CM | POA: Diagnosis not present

## 2017-12-20 LAB — COMPREHENSIVE METABOLIC PANEL
ALBUMIN: 3.2 g/dL — AB (ref 3.5–5.0)
ALT: 11 U/L — ABNORMAL LOW (ref 14–54)
ANION GAP: 10 (ref 5–15)
AST: 16 U/L (ref 15–41)
Alkaline Phosphatase: 67 U/L (ref 38–126)
BILIRUBIN TOTAL: 0.8 mg/dL (ref 0.3–1.2)
BUN: 6 mg/dL (ref 6–20)
CO2: 17 mmol/L — ABNORMAL LOW (ref 22–32)
Calcium: 8.6 mg/dL — ABNORMAL LOW (ref 8.9–10.3)
Chloride: 107 mmol/L (ref 101–111)
Creatinine, Ser: 0.4 mg/dL — ABNORMAL LOW (ref 0.44–1.00)
GFR calc Af Amer: >60 mL/min (ref >60)
GFR calc non Af Amer: >60 mL/min (ref >60)
GLUCOSE: 84 mg/dL (ref 65–99)
POTASSIUM: 3.4 mmol/L — AB (ref 3.5–5.1)
SODIUM: 134 mmol/L — AB (ref 135–145)
TOTAL PROTEIN: 7.2 g/dL (ref 6.5–8.1)

## 2017-12-20 LAB — URINALYSIS, ROUTINE W REFLEX MICROSCOPIC
Bilirubin Urine: NEGATIVE
GLUCOSE, UA: NEGATIVE mg/dL
Hgb urine dipstick: NEGATIVE
KETONES UR: NEGATIVE mg/dL
Nitrite: NEGATIVE
PROTEIN: NEGATIVE mg/dL
Specific Gravity, Urine: 1.014 (ref 1.005–1.030)
pH: 6 (ref 5.0–8.0)

## 2017-12-20 LAB — CBC
HEMATOCRIT: 35.1 % (ref 35.0–47.0)
HEMOGLOBIN: 11.5 g/dL — AB (ref 12.0–16.0)
MCH: 29.6 pg (ref 26.0–34.0)
MCHC: 32.9 g/dL (ref 32.0–36.0)
MCV: 90.1 fL (ref 80.0–100.0)
Platelets: 293 10*3/uL (ref 150–440)
RBC: 3.9 MIL/uL (ref 3.80–5.20)
RDW: 13.6 % (ref 11.5–14.5)
WBC: 8.9 10*3/uL (ref 3.6–11.0)

## 2017-12-20 LAB — LIPASE, BLOOD: LIPASE: 27 U/L (ref 11–51)

## 2017-12-20 MED ORDER — ONDANSETRON HCL 4 MG/2ML IJ SOLN
4.0000 mg | INTRAMUSCULAR | Status: DC | PRN
  Administered 2017-12-20: 4 mg via INTRAVENOUS
  Filled 2017-12-20: qty 2

## 2017-12-20 MED ORDER — LACTATED RINGERS IV SOLN
Freq: Once | INTRAVENOUS | Status: AC
  Administered 2017-12-20: 02:00:00 via INTRAVENOUS

## 2017-12-20 MED ORDER — SODIUM CHLORIDE FLUSH 0.9 % IV SOLN
INTRAVENOUS | Status: AC
  Filled 2017-12-20: qty 10

## 2017-12-20 NOTE — OB Triage Note (Signed)
Pt present to L&D with c/o N/V for 4 days, decreased appetite, pain with urination, and general abdominal pain 6/10 that is intermittent. Pt denies contractions, decreased fetal movement, leaking of fluid, or vaginal bleeding. Toco and EFM applied and explained. Will monitor.

## 2017-12-20 NOTE — Discharge Instructions (Signed)
Please call or return to the [facility] with any of the following: Decreased fetal movement Leaking of fluid Contractions every 3-5 mins for > 1 hour Vaginal bleeding

## 2017-12-30 NOTE — Discharge Summary (Signed)
Victoria Villarreal is a 22 y.o. female. She is at 6638w3d gestation. Patient's last menstrual period was 07/08/2017 (approximate). Estimated Date of Delivery: 04/14/18  [facility]    Chief complaint: nausea and vomiting  Location: abdomen Onset/timing: 4 days ago, gradual Duration:  4 days Quality: pukey Severity: mild to moderate Aggravating or alleviating conditions: nothing has made better, it is gradually getting worse.   Associated signs/symptoms:no blood in emesis, no diarrhea, no fever/chills, no CTX, no VB.no LOF,  Active fetal movement Context:  4 days ago patient has had n/v and decreased appetite. Some dysuria.    Maternal Medical History:   Past Medical History:  Diagnosis Date  . Trichomoniasis     History reviewed. No pertinent surgical history.  Allergies  Allergen Reactions  . Hydrocodone Rash  . Tramadol Rash    Prior to Admission medications   Medication Sig Start Date End Date Taking? Authorizing Provider  ondansetron (ZOFRAN ODT) 4 MG disintegrating tablet Allow 1-2 tablets to dissolve in your mouth every 8 hours as needed for nausea/vomiting 02/23/17   Loleta RoseForbach, Cory, MD  Prenatal Vit-Fe Fumarate-FA (PRENATAL MULTIVITAMIN) TABS tablet Take 1 tablet by mouth daily at 12 noon.    [provider]     Social History: She  reports that she has been smoking cigarettes.  She has been smoking about 0.50 packs per day. She has never used smokeless tobacco. She reports that she drinks about 0.6 oz of alcohol per week. She reports that she does not use drugs.  Family History:  no history of gyn cancers  Review of Systems: A full review of systems was performed and negative except as noted in the HPI.     O:  BP 119/71 (BP Location: Left Arm)   Pulse 89   Temp 97.9 F (36.6 C) (Oral)   Resp 18   LMP 07/08/2017 (Approximate)  No results found for this or any previous visit (from the past 48 hour(s)).   Constitutional: NAD,  AAOx3  HE/ENT: extraocular movements grossly intact, moist mucous membranes CV: RRR PULM: nl respiratory effort, CTABL     Abd: gravid, non-tender, non-distended, soft      Ext: Non-tender, Nonedmeatous   Psych: mood appropriate, speech normal Pelvic deferred   A/P: 22 y.o. 5638w3d here for GI distress, dysuria, and abdominal pain  Labor: not present.   Fetal Wellbeing: Reassuring Cat 1 tracing.  GI bug, gave IV fluids, zofran, and over time patient felt better.    UA negative except for trace leuks.  D/c home stable, precautions reviewed, follow-up as scheduled.   ----- Ranae Plumberhelsea Raiyan Dalesandro, MD Attending Obstetrician and Gynecologist [facility], Department of OB/GYN [facility]

## 2017-12-31 ENCOUNTER — Encounter: Payer: Self-pay | Admitting: *Deleted

## 2017-12-31 ENCOUNTER — Observation Stay
Admission: EM | Admit: 2017-12-31 | Discharge: 2018-01-01 | Disposition: A | Discharge status: Home / Self Care | Payer: Medicaid Other | Attending: Obstetrics and Gynecology | Admitting: Obstetrics and Gynecology

## 2017-12-31 DIAGNOSIS — O212 Late vomiting of pregnancy: Secondary | ICD-10-CM | POA: Insufficient documentation

## 2017-12-31 DIAGNOSIS — Z3A23 23 weeks gestation of pregnancy: Secondary | ICD-10-CM | POA: Insufficient documentation

## 2017-12-31 DIAGNOSIS — R1013 Epigastric pain: Secondary | ICD-10-CM | POA: Diagnosis not present

## 2017-12-31 DIAGNOSIS — Z87440 Personal history of urinary (tract) infections: Secondary | ICD-10-CM | POA: Diagnosis not present

## 2017-12-31 DIAGNOSIS — F17210 Nicotine dependence, cigarettes, uncomplicated: Secondary | ICD-10-CM | POA: Diagnosis not present

## 2017-12-31 DIAGNOSIS — Z79899 Other long term (current) drug therapy: Secondary | ICD-10-CM | POA: Diagnosis not present

## 2017-12-31 DIAGNOSIS — O219 Vomiting of pregnancy, unspecified: Secondary | ICD-10-CM | POA: Diagnosis present

## 2017-12-31 DIAGNOSIS — N760 Acute vaginitis: Secondary | ICD-10-CM | POA: Insufficient documentation

## 2017-12-31 DIAGNOSIS — O99332 Smoking (tobacco) complicating pregnancy, second trimester: Secondary | ICD-10-CM | POA: Diagnosis not present

## 2017-12-31 DIAGNOSIS — O26892 Other specified pregnancy related conditions, second trimester: Secondary | ICD-10-CM | POA: Diagnosis present

## 2017-12-31 DIAGNOSIS — O2342 Unspecified infection of urinary tract in pregnancy, second trimester: Secondary | ICD-10-CM | POA: Diagnosis not present

## 2017-12-31 DIAGNOSIS — O23592 Infection of other part of genital tract in pregnancy, second trimester: Secondary | ICD-10-CM | POA: Diagnosis not present

## 2017-12-31 HISTORY — DX: Depression, unspecified: F32.A

## 2017-12-31 HISTORY — DX: Anemia, unspecified: D64.9

## 2017-12-31 HISTORY — DX: Major depressive disorder, single episode, unspecified: F32.9

## 2017-12-31 LAB — URINALYSIS, COMPLETE (UACMP) WITH MICROSCOPIC
Bilirubin Urine: NEGATIVE
GLUCOSE, UA: NEGATIVE mg/dL
HGB URINE DIPSTICK: NEGATIVE
Ketones, ur: NEGATIVE mg/dL
Nitrite: POSITIVE — AB
PROTEIN: 30 mg/dL — AB
Specific Gravity, Urine: 1.015 (ref 1.005–1.030)
pH: 6 (ref 5.0–8.0)

## 2017-12-31 LAB — COMPREHENSIVE METABOLIC PANEL
ALT: 8 U/L — AB (ref 14–54)
AST: 16 U/L (ref 15–41)
Albumin: 3.3 g/dL — ABNORMAL LOW (ref 3.5–5.0)
Alkaline Phosphatase: 81 U/L (ref 38–126)
Anion gap: 9 (ref 5–15)
CHLORIDE: 105 mmol/L (ref 101–111)
CO2: 22 mmol/L (ref 22–32)
CREATININE: 0.43 mg/dL — AB (ref 0.44–1.00)
Calcium: 8.5 mg/dL — ABNORMAL LOW (ref 8.9–10.3)
GFR calc Af Amer: >60 mL/min (ref >60)
GFR calc non Af Amer: >60 mL/min (ref >60)
Glucose, Bld: 82 mg/dL (ref 65–99)
POTASSIUM: 3.1 mmol/L — AB (ref 3.5–5.1)
SODIUM: 136 mmol/L (ref 135–145)
Total Bilirubin: 0.5 mg/dL (ref 0.3–1.2)
Total Protein: 7.4 g/dL (ref 6.5–8.1)

## 2017-12-31 LAB — CBC
HEMATOCRIT: 35.1 % (ref 35.0–47.0)
Hemoglobin: 11.5 g/dL — ABNORMAL LOW (ref 12.0–16.0)
MCH: 29.3 pg (ref 26.0–34.0)
MCHC: 32.7 g/dL (ref 32.0–36.0)
MCV: 89.4 fL (ref 80.0–100.0)
PLATELETS: 301 10*3/uL (ref 150–440)
RBC: 3.93 MIL/uL (ref 3.80–5.20)
RDW: 13.6 % (ref 11.5–14.5)
WBC: 9.2 10*3/uL (ref 3.6–11.0)

## 2017-12-31 LAB — FETAL FIBRONECTIN: Fetal Fibronectin: NEGATIVE

## 2017-12-31 LAB — WET PREP, GENITAL
SPERM: NONE SEEN
Trich, Wet Prep: NONE SEEN
Yeast Wet Prep HPF POC: NONE SEEN

## 2017-12-31 LAB — CHLAMYDIA/NGC RT PCR (ARMC ONLY)
CHLAMYDIA TR: NOT DETECTED
N gonorrhoeae: NOT DETECTED

## 2017-12-31 MED ORDER — LACTATED RINGERS IV SOLN
INTRAVENOUS | Status: DC
  Administered 2017-12-31 (×2): via INTRAVENOUS

## 2017-12-31 MED ORDER — AMOXICILLIN-POT CLAVULANATE 875-125 MG PO TABS
1.0000 | ORAL_TABLET | Freq: Two times a day (BID) | ORAL | Status: DC
  Administered 2017-12-31: 1 via ORAL
  Filled 2017-12-31 (×2): qty 1

## 2017-12-31 MED ORDER — FAMOTIDINE 20 MG PO TABS
20.0000 mg | ORAL_TABLET | Freq: Every day | ORAL | Status: DC
  Administered 2017-12-31: 20 mg via ORAL
  Filled 2017-12-31: qty 1

## 2017-12-31 MED ORDER — ONDANSETRON HCL 4 MG/2ML IJ SOLN
4.0000 mg | Freq: Once | INTRAMUSCULAR | Status: AC
  Administered 2017-12-31: 4 mg via INTRAVENOUS
  Filled 2017-12-31: qty 2

## 2017-12-31 NOTE — Discharge Summary (Signed)
Patient ID: Victoria Villarreal MRN: [id number] DOB/AGE: 1996-07-26 22 y.o.  Admit date: 12/31/2017 Discharge date: 01/01/2018  Admission Diagnoses: N/V pregnancy, vaginal discharge  Discharge Diagnoses:  1. UTI 2. Bacterial vaginosis 3. N/V pregnancy, epigastric pain  Prenatal Procedures: NST  Consults: none  Significant Diagnostic Studies:  Results for orders placed or performed during the hospital encounter of 12/31/17 (from the past 168 hour(s))  Comprehensive metabolic panel   Collection Time: 12/31/17  9:24 PM  Result Value Ref Range   Sodium 136 135 - 145 mmol/L   Potassium 3.1 (L) 3.5 - 5.1 mmol/L   Chloride 105 101 - 111 mmol/L   CO2 22 22 - 32 mmol/L   Glucose, Bld 82 65 - 99 mg/dL   BUN <5 (L) 6 - 20 mg/dL   Creatinine, Ser 4.09 (L) 0.44 - 1.00 mg/dL   Calcium 8.5 (L) 8.9 - 10.3 mg/dL   Total Protein 7.4 6.5 - 8.1 g/dL   Albumin 3.3 (L) 3.5 - 5.0 g/dL   AST 16 15 - 41 U/L   ALT 8 (L) 14 - 54 U/L   Alkaline Phosphatase 81 38 - 126 U/L   Total Bilirubin 0.5 0.3 - 1.2 mg/dL   GFR calc non Af Amer >60 >60 mL/min   GFR calc Af Amer >60 >60 mL/min   Anion gap 9 5 - 15  CBC on admission   Collection Time: 12/31/17  9:24 PM  Result Value Ref Range   WBC 9.2 3.6 - 11.0 K/uL   RBC 3.93 3.80 - 5.20 MIL/uL   Hemoglobin 11.5 (L) 12.0 - 16.0 g/dL   HCT 81.1 91.4 - 78.2 %   MCV 89.4 80.0 - 100.0 fL   MCH 29.3 26.0 - 34.0 pg   MCHC 32.7 32.0 - 36.0 g/dL   RDW 95.6 21.3 - 08.6 %   Platelets 301 150 - 440 K/uL  Urinalysis, Complete w Microscopic   Collection Time: 12/31/17  9:24 PM  Result Value Ref Range   Color, Urine AMBER (A) YELLOW   APPearance CLOUDY (A) CLEAR   Specific Gravity, Urine 1.015 1.005 - 1.030   pH 6.0 5.0 - 8.0   Glucose, UA NEGATIVE NEGATIVE mg/dL   Hgb urine dipstick NEGATIVE NEGATIVE   Bilirubin Urine NEGATIVE NEGATIVE   Ketones, ur NEGATIVE NEGATIVE mg/dL   Protein, ur 30 (A) NEGATIVE mg/dL   Nitrite POSITIVE (A) NEGATIVE   Leukocytes, UA  LARGE (A) NEGATIVE   RBC / HPF 0-5 0 - 5 RBC/hpf   WBC, UA TOO NUMEROUS TO COUNT 0 - 5 WBC/hpf   Bacteria, UA MANY (A) NONE SEEN   Squamous Epithelial / LPF 6-30 (A) NONE SEEN   WBC Clumps PRESENT    Mucus PRESENT    Amorphous Crystal PRESENT   Wet prep, genital   Collection Time: 12/31/17 10:03 PM  Result Value Ref Range   Yeast Wet Prep HPF POC NONE SEEN NONE SEEN   Trich, Wet Prep NONE SEEN NONE SEEN   Clue Cells Wet Prep HPF POC PRESENT (A) NONE SEEN   WBC, Wet Prep HPF POC FEW (A) NONE SEEN   Sperm NONE SEEN   Chlamydia/NGC rt PCR ([facility] only)   Collection Time: 12/31/17 10:03 PM  Result Value Ref Range   Specimen source GC/Chlam ENDOCERVICAL    Chlamydia Tr NOT DETECTED NOT DETECTED   N gonorrhoeae NOT DETECTED NOT DETECTED  Fetal fibronectin   Collection Time: 12/31/17 10:03 PM  Result Value Ref Range  Fetal Fibronectin NEGATIVE NEGATIVE   Appearance, FETFIB CLEAR CLEAR    Treatments: IV hydration and antibiotics: augmentin  Hospital Course:  This is a 22 y.o. G4P3001 with [facility] 3832w1d [facility] N/V pregnancy and vaginal discharge. Upon arrival she was noted to have UCs q3-98min. SSE done, with Closed cervix, copious vaginal discharge noted. Wet prep resulted with + clue cells, neg FFN and Neg GC/CT.  No leaking of fluid and no bleeding.   She was observed, fetal heart rate monitoring remained reassuring, and she had no signs/symptoms of progressing preterm labor. She was also dx with UTI- started on Augmentin. IV hydration was given with 1 dose IV zofran and PO zantac for epigastric/reflux type pain. She indicated good relief of pain, UCs and nausea.  She was deemed stable for discharge to home with outpatient follow up.   Discharge Physical Exam:  BP 122/72 (BP Location: Right Arm)   Pulse 89   Temp 98.1 F (36.7 C) (Oral)   Resp 18   Ht 5\' 7"  (1.702 m)   Wt 173 lb (78.5 kg)   LMP 07/08/2017 (Approximate)   SpO2 99%   BMI 27.10 kg/m   General: NAD CV:  RRR Pulm: CTABL, nl effort ABD: s/nd/nt, gravid DVT Evaluation: LE non-ttp, no evidence of DVT on exam.   FHT: Appropriate for GA TOCO: no further UCs after 2nd bag LR hung.    Discharge Condition: Stable  Disposition: Discharge disposition: 01-Home or Self Care     dc home  Discharge Instructions    Notify physician for a general feeling that "something is not right"   Complete by:  As directed    Notify physician for increase or change in vaginal discharge   Complete by:  As directed    Notify physician for intestinal cramps, with or without diarrhea, sometimes described as "gas pain"   Complete by:  As directed    Notify physician for leaking of fluid   Complete by:  As directed    Notify physician for low, dull backache, unrelieved by heat or Tylenol   Complete by:  As directed    Notify physician for menstrual like cramps   Complete by:  As directed    Notify physician for pelvic pressure   Complete by:  As directed    Notify physician for uterine contractions.  These may be painless and feel like the uterus is tightening or the baby is  "balling up"   Complete by:  As directed    Notify physician for vaginal bleeding   Complete by:  As directed    PRETERM LABOR:  Includes any of the follwing symptoms that occur between 20 - [redacted] weeks gestation.  If these symptoms are not stopped, preterm labor can result in preterm delivery, placing your baby at risk   Complete by:  As directed      Allergies as of 01/01/2018      Reactions   Hydrocodone Rash   Tramadol Rash      Medication List    TAKE these medications   amoxicillin-clavulanate 875-125 MG tablet Commonly known as:  AUGMENTIN Take 1 tablet by mouth every 12 (twelve) hours.   famotidine 20 MG tablet Commonly known as:  PEPCID Take 1 tablet (20 mg total) by mouth at bedtime.   metroNIDAZOLE 0.75 % vaginal gel Commonly known as:  METROGEL Place 1 Applicatorful vaginally at bedtime. Apply one  applicatorful to vagina at bedtime for 5 days  metroNIDAZOLE 0.75 % vaginal gel Commonly known as:  METROGEL Place 1 Applicatorful vaginally at bedtime. Apply one applicatorful to vagina at bedtime for 5 days   ondansetron 4 MG disintegrating tablet Commonly known as:  ZOFRAN ODT Allow 1-2 tablets to dissolve in your mouth every 8 hours as needed for nausea/vomiting   prenatal multivitamin Tabs tablet Take 1 tablet by mouth daily at 12 noon.        Signed: ----- McVey, REBECCA A, CNM 01/01/2018 12:10 AM

## 2017-12-31 NOTE — Progress Notes (Signed)
Victoria Villarreal is a 22 y.o. female. She is at 6048w1d gestation. Patient's last menstrual period was 07/08/2017 (approximate). Estimated Date of Delivery: 04/14/18  [facility]  Current pregnancy complicated by:  1. Resolved SCH with VB in 1st trimester 2. Current smoker 3. Ongoing N/V- 2nd triage visit for same complaint this pregnancy 4. Hx UTI in preg  Chief complaint: nausea with vomiting for 3 weeks. Also c/o vaginal discharg and abdominal pain.  Has noticed urinary odor and burning.   S: Resting comfortably. no VB.no LOF,  Active fetal movement.  Denies: HA, visual changes, SOB. Reports general abdominal pain, unable to localize pain, some epigastric pain and some mid to lower abdomen.   Maternal Medical History:   Past Medical History:  Diagnosis Date  . Anemia   . Depression   . Trichomoniasis     History reviewed. No pertinent surgical history.  Allergies  Allergen Reactions  . Hydrocodone Rash  . Tramadol Rash    Prior to Admission medications   Medication Sig Start Date End Date Taking? Authorizing Provider  Prenatal Vit-Fe Fumarate-FA (PRENATAL MULTIVITAMIN) TABS tablet Take 1 tablet by mouth daily at 12 noon.   Yes [provider]  ondansetron (ZOFRAN ODT) 4 MG disintegrating tablet Allow 1-2 tablets to dissolve in your mouth every 8 hours as needed for nausea/vomiting Patient not taking: Reported on 12/31/2017 02/23/17   Loleta RoseForbach, Cory, MD      Social History: She  reports that she has been smoking cigarettes.  She has been smoking about 0.25 packs per day. She has never used smokeless tobacco. She reports that she drinks about 0.6 oz of alcohol per week. She reports that she does not use drugs.  Pt is unaccompanied, has been on phone throughout visit- very tearful and reports that her spouse "leaves her often".   Family History: family history is not on file.   Review of Systems: A full review of systems was performed and  negative except as noted in the HPI.     O:  BP 122/72 (BP Location: Right Arm)   Pulse 89   Temp 98.1 F (36.7 C) (Oral)   Resp 18   Ht 5\' 7"  (1.702 m)   Wt 173 lb (78.5 kg)   LMP 07/08/2017 (Approximate)   BMI 27.10 kg/m  Results for orders placed or performed during the hospital encounter of 12/31/17 (from the past 48 hour(s))  Comprehensive metabolic panel   Collection Time: 12/31/17  9:24 PM  Result Value Ref Range   Sodium 136 135 - 145 mmol/L   Potassium 3.1 (L) 3.5 - 5.1 mmol/L   Chloride 105 101 - 111 mmol/L   CO2 22 22 - 32 mmol/L   Glucose, Bld 82 65 - 99 mg/dL   BUN <5 (L) 6 - 20 mg/dL   Creatinine, Ser 0.980.43 (L) 0.44 - 1.00 mg/dL   Calcium 8.5 (L) 8.9 - 10.3 mg/dL   Total Protein 7.4 6.5 - 8.1 g/dL   Albumin 3.3 (L) 3.5 - 5.0 g/dL   AST 16 15 - 41 U/L   ALT 8 (L) 14 - 54 U/L   Alkaline Phosphatase 81 38 - 126 U/L   Total Bilirubin 0.5 0.3 - 1.2 mg/dL   GFR calc non Af Amer >60 >60 mL/min   GFR calc Af Amer >60 >60 mL/min   Anion gap 9 5 - 15  CBC on admission   Collection Time: 12/31/17  9:24 PM  Result  Value Ref Range   WBC 9.2 3.6 - 11.0 K/uL   RBC 3.93 3.80 - 5.20 MIL/uL   Hemoglobin 11.5 (L) 12.0 - 16.0 g/dL   HCT 40.9 81.1 - 91.4 %   MCV 89.4 80.0 - 100.0 fL   MCH 29.3 26.0 - 34.0 pg   MCHC 32.7 32.0 - 36.0 g/dL   RDW 78.2 95.6 - 21.3 %   Platelets 301 150 - 440 K/uL  Urinalysis, Complete w Microscopic   Collection Time: 12/31/17  9:24 PM  Result Value Ref Range   Color, Urine AMBER (A) YELLOW   APPearance CLOUDY (A) CLEAR   Specific Gravity, Urine 1.015 1.005 - 1.030   pH 6.0 5.0 - 8.0   Glucose, UA NEGATIVE NEGATIVE mg/dL   Hgb urine dipstick NEGATIVE NEGATIVE   Bilirubin Urine NEGATIVE NEGATIVE   Ketones, ur NEGATIVE NEGATIVE mg/dL   Protein, ur 30 (A) NEGATIVE mg/dL   Nitrite POSITIVE (A) NEGATIVE   Leukocytes, UA LARGE (A) NEGATIVE   RBC / HPF 0-5 0 - 5 RBC/hpf   WBC, UA TOO NUMEROUS TO COUNT 0 - 5 WBC/hpf   Bacteria, UA MANY (A)  NONE SEEN   Squamous Epithelial / LPF 6-30 (A) NONE SEEN   WBC Clumps PRESENT    Mucus PRESENT    Amorphous Crystal PRESENT      Constitutional: NAD, AAOx3;  HE/ENT: extraocular movements grossly intact, moist mucous membranes CV: RRR PULM: nl respiratory effort, CTABL     Abd: gravid, non-tender, non-distended, soft      Ext: Non-tender, Nonedematous   Psych: mood appropriate, speech normal, generally tearful.  Pelvic: FFN obtained then SSE done  Pelvic exam:copious amt vaginal discharge noted, slight erythema noted, Cx visually closed. GC/Ct and wet prep obtained.   Fetal  monitoring: Cat I Appropriate for GA Baseline: 140bpm Variability: moderate Accelerations: present 10x10 x >2 Decelerations absent  Toco: UCs palpate mild, q4-68min, lasting 30-50sec   A/P: 22 y.o. [redacted]w[redacted]d here for antenatal surveillance for N/V of pregnancy, vaginal discharge, depression symptoms.    Preterm contractions: FFN done, wet prep and FFN pending.   N/V- IV hydration with IV zofran given, PO zantac given. No vomiting noted while in Obs. Will get RUQ [place] outpatient.   UA c/w UTI- rx Augmentin based on prior sensitivities for last UTI. No e/o pyelo, normal WBCs, no flank pain or fever.     Fetal Wellbeing: Reassuring Cat 1 tracing.    D/c home stable, precautions reviewed, follow-up as scheduled.    Javonna Balli A, CNM @TODAY @ 10:09 PM

## 2017-12-31 NOTE — Progress Notes (Signed)
Pt is tearful after getting off the phone with significant other. Pt reports he leaves her a lot and doesn't pay attention to her. Pt denies any abuse in the home and states he does help her with her other children but she has had a lot of anxiety about it. Pt reports feeling depressed but denies any thoughts of harming herself. CNM notified of situation.

## 2017-12-31 NOTE — OB Triage Note (Signed)
Pt arrived from home with complaints of nausea/vomiting and abdominal pain for 3 weeks. Pt last vomited earlier this morning. Pt complains of being tired all the time and not being able to eat or drink anything. Pt complains of pain in her upper abdomen that is constant. Pt denies any vaginal bleeding but does reports some brown vaginal discharge. Pt reports positive fetal movement.

## 2018-01-01 DIAGNOSIS — O2342 Unspecified infection of urinary tract in pregnancy, second trimester: Secondary | ICD-10-CM | POA: Diagnosis not present

## 2018-01-01 MED ORDER — METRONIDAZOLE 0.75 % VA GEL: 1.0000 | Freq: Every day | VAGINAL | 0 refills | Status: DC

## 2018-01-01 MED ORDER — AMOXICILLIN-POT CLAVULANATE 875-125 MG PO TABS: 1.0000 | ORAL_TABLET | Freq: Two times a day (BID) | ORAL | 0 refills | Status: DC

## 2018-01-01 MED ORDER — FAMOTIDINE 20 MG PO TABS: 20.0000 mg | ORAL_TABLET | Freq: Every day | ORAL | 1 refills | Status: DC

## 2018-01-03 LAB — URINE CULTURE
Culture: >=100000 — AB
Special Requests: NORMAL

## 2018-03-02 ENCOUNTER — Other Ambulatory Visit: Payer: Self-pay | Admitting: Obstetrics and Gynecology

## 2018-03-02 ENCOUNTER — Observation Stay
Admission: EM | Admit: 2018-03-02 | Discharge: 2018-03-02 | Disposition: A | Discharge status: Home / Self Care | Payer: Medicaid Other | Attending: Obstetrics and Gynecology | Admitting: Obstetrics and Gynecology

## 2018-03-02 DIAGNOSIS — Z885 Allergy status to narcotic agent status: Secondary | ICD-10-CM | POA: Insufficient documentation

## 2018-03-02 DIAGNOSIS — Z888 Allergy status to other drugs, medicaments and biological substances status: Secondary | ICD-10-CM | POA: Diagnosis not present

## 2018-03-02 DIAGNOSIS — Z3A33 33 weeks gestation of pregnancy: Secondary | ICD-10-CM | POA: Diagnosis not present

## 2018-03-02 DIAGNOSIS — O99343 Other mental disorders complicating pregnancy, third trimester: Secondary | ICD-10-CM | POA: Diagnosis not present

## 2018-03-02 DIAGNOSIS — O99333 Smoking (tobacco) complicating pregnancy, third trimester: Secondary | ICD-10-CM | POA: Insufficient documentation

## 2018-03-02 DIAGNOSIS — B9620 Unspecified Escherichia coli [E. coli] as the cause of diseases classified elsewhere: Secondary | ICD-10-CM | POA: Diagnosis not present

## 2018-03-02 DIAGNOSIS — F17210 Nicotine dependence, cigarettes, uncomplicated: Secondary | ICD-10-CM | POA: Diagnosis not present

## 2018-03-02 DIAGNOSIS — O4703 False labor before 37 completed weeks of gestation, third trimester: Secondary | ICD-10-CM

## 2018-03-02 DIAGNOSIS — F329 Major depressive disorder, single episode, unspecified: Secondary | ICD-10-CM | POA: Insufficient documentation

## 2018-03-02 DIAGNOSIS — Z79899 Other long term (current) drug therapy: Secondary | ICD-10-CM | POA: Insufficient documentation

## 2018-03-02 DIAGNOSIS — O2343 Unspecified infection of urinary tract in pregnancy, third trimester: Principal | ICD-10-CM | POA: Insufficient documentation

## 2018-03-02 LAB — URINALYSIS, ROUTINE W REFLEX MICROSCOPIC
BILIRUBIN URINE: NEGATIVE
Glucose, UA: NEGATIVE mg/dL
HGB URINE DIPSTICK: NEGATIVE
KETONES UR: NEGATIVE mg/dL
LEUKOCYTES UA: NEGATIVE
NITRITE: POSITIVE — AB
PH: 6 (ref 5.0–8.0)
Protein, ur: NEGATIVE mg/dL
Specific Gravity, Urine: 1.012 (ref 1.005–1.030)

## 2018-03-02 LAB — CHLAMYDIA/NGC RT PCR (ARMC ONLY)
CHLAMYDIA TR: NOT DETECTED
N gonorrhoeae: NOT DETECTED

## 2018-03-02 LAB — OB RESULTS CONSOLE GBS: STREP GROUP B AG: NEGATIVE

## 2018-03-02 MED ORDER — ACETAMINOPHEN 325 MG PO TABS: 650.0000 mg | ORAL_TABLET | ORAL | Status: DC | PRN

## 2018-03-02 MED ORDER — TERBUTALINE SULFATE 1 MG/ML IJ SOLN: 0.2500 mg | Freq: Once | INTRAMUSCULAR | Status: DC | PRN

## 2018-03-02 MED ORDER — BETAMETHASONE SOD PHOS & ACET 6 (3-3) MG/ML IJ SUSP
INTRAMUSCULAR | Status: AC
  Filled 2018-03-02: qty 1

## 2018-03-02 MED ORDER — BETAMETHASONE SOD PHOS & ACET 6 (3-3) MG/ML IJ SUSP
12.0000 mg | INTRAMUSCULAR | Status: DC
  Administered 2018-03-02: 12 mg via INTRAMUSCULAR

## 2018-03-02 MED ORDER — BETAMETHASONE SOD PHOS & ACET 6 (3-3) MG/ML IJ SUSP: 12.5000 mg | Freq: Once | INTRAMUSCULAR | 0 refills | Status: AC

## 2018-03-02 MED ORDER — AMOXICILLIN-POT CLAVULANATE 875-125 MG PO TABS: 1.0000 | ORAL_TABLET | Freq: Two times a day (BID) | ORAL | 0 refills | Status: DC

## 2018-03-02 MED ORDER — AMOXICILLIN-POT CLAVULANATE 875-125 MG PO TABS
1.0000 | ORAL_TABLET | Freq: Two times a day (BID) | ORAL | Status: DC
  Administered 2018-03-02: 1 via ORAL
  Filled 2018-03-02: qty 1

## 2018-03-02 NOTE — Progress Notes (Signed)
Orders placed.

## 2018-03-02 NOTE — Discharge Summary (Signed)
Victoria Villarreal is a 22 y.o. female. She is at [redacted]w[redacted]d gestation. Patient's last menstrual period was 07/08/2017 (approximate). Estimated Date of Delivery: 04/14/18  Prenatal care site: [facility]   Current pregnancy complicated by:  1. Preterm contractions 2. Tobacco use 3. resolved [facility]  Chief complaint: sent from office with cervical dilation  Location: has had several episodes of "feeling like I'm going into labor", most recent was a few nights ago.  Onset/timing: Low back pain with pelvic pressure that was coming and going.  Aggravating or alleviating conditions: has had 2 UTI this preg, current vaginitis.   S: Resting comfortably. no VB.no LOF,  Active fetal movement. Denies: HA, visual changes, SOB, or RUQ/epigastric pain  Maternal Medical History:   Past Medical History:  Diagnosis Date  . Anemia   . Depression   . Trichomoniasis     No past surgical history on file.  Allergies  Allergen Reactions  . Hydrocodone Rash  . Tramadol Rash    Prior to Admission medications   Medication Sig Start Date End Date Taking? Authorizing Provider  amoxicillin-clavulanate (AUGMENTIN) 875-125 MG tablet Take 1 tablet by mouth every 12 (twelve) hours. 01/01/18   McVey, Prudencio Pair, CNM  betamethasone acetate-betamethasone sodium phosphate (CELESTONE) 6 (3-3) MG/ML injection Inject 2.1 mLs (12.5 mg total) into the muscle once for 1 dose. Repeat the dose in 24 hours 03/02/18 03/02/18  Sharee Pimple, CNM  famotidine (PEPCID) 20 MG tablet Take 1 tablet (20 mg total) by mouth at bedtime. 01/01/18   McVey, Prudencio Pair, CNM  metroNIDAZOLE (METROGEL) 0.75 % vaginal gel Place 1 Applicatorful vaginally at bedtime. Apply one applicatorful to vagina at bedtime for 5 days 01/01/18   McVey, Prudencio Pair, CNM  metroNIDAZOLE (METROGEL) 0.75 % vaginal gel Place 1 Applicatorful vaginally at bedtime. Apply one applicatorful to vagina at bedtime for 5 days 01/01/18   McVey, Prudencio Pair, CNM  ondansetron (ZOFRAN  ODT) 4 MG disintegrating tablet Allow 1-2 tablets to dissolve in your mouth every 8 hours as needed for nausea/vomiting Patient not taking: Reported on 12/31/2017 02/23/17   Loleta Rose, MD  Prenatal Vit-Fe Fumarate-FA (PRENATAL MULTIVITAMIN) TABS tablet Take 1 tablet by mouth daily at 12 noon.    [provider]      Social History: She  reports that she has been smoking cigarettes.  She has been smoking about 0.25 packs per day. She has never used smokeless tobacco. She reports that she drinks about 0.6 oz of alcohol per week. She reports that she does not use drugs.  Family History: no history of gyn cancers  Review of Systems: A full review of systems was performed and negative except as noted in the HPI.     O:  Temp 97.7 F (36.5 C) (Oral)   Resp 16   Ht  (1.702 m)   Wt 177 lb (80.3 kg)   LMP 07/08/2017 (Approximate)   BMI 27.72 kg/m  Results for orders placed or performed during the hospital encounter of 03/02/18 (from the past 48 hour(s))  Urinalysis, Routine w reflex microscopic   Collection Time: 03/02/18 12:27 PM  Result Value Ref Range   Color, Urine YELLOW (A) YELLOW   APPearance HAZY (A) CLEAR   Specific Gravity, Urine 1.012 1.005 - 1.030   pH 6.0 5.0 - 8.0   Glucose, UA NEGATIVE NEGATIVE mg/dL   Hgb urine dipstick NEGATIVE NEGATIVE   Bilirubin Urine NEGATIVE NEGATIVE   Ketones, ur NEGATIVE NEGATIVE mg/dL  Protein, ur NEGATIVE NEGATIVE mg/dL   Nitrite POSITIVE (A) NEGATIVE   Leukocytes, UA NEGATIVE NEGATIVE   RBC / HPF 0-5 0 - 5 RBC/hpf   WBC, UA 6-10 0 - 5 WBC/hpf   Bacteria, UA MANY (A) NONE SEEN   Squamous Epithelial / LPF 0-5 0 - 5   Mucus PRESENT      Constitutional: NAD, AAOx3  HE/ENT: extraocular movements grossly intact, moist mucous membranes CV: RRR PULM: nl respiratory effort, CTABL     Abd: gravid, non-tender, non-distended, soft    Ext: Non-tender, Nonedematous   Psych: mood appropriate, speech normal Pelvic: + vaginal dc  with odor, GC/CT and GBS swabs done due to preterm cervical dilation. Cervix rechecked: 1-2/long/-3, posterior and soft.   Fetal  monitoring: Cat 1 Appropriate for GA Baseline: 130bpm Variability: moderate Accelerations: present x >2 Decelerations absent Toco: No UCs noted     A/P: 22 y.o. [redacted]w[redacted]d here for antenatal surveillance for LBP and pelvic pressure sent from office.   Preterm labor: not present.   Fetal Wellbeing: Reassuring Cat 1 tracing.  UA with + nitrites, WBCs>epithelial, 2 previous UTIs this pregnancy. WIll treat with Augmentin (per previous urine culture sensitivity report).   Dx with BV per office wet prep, to pickup Flagyl and take as directed.   Strict preterm labor precautions discussed, pt to return tomorrow for 2nd BMZ injection.   D/c home stable, precautions reviewed, follow-up as scheduled.    McVey, REBECCA A, CNM 03/02/2018  1:11 PM

## 2018-03-03 ENCOUNTER — Inpatient Hospital Stay
Admission: RE | Admit: 2018-03-03 | Discharge: 2018-03-03 | Disposition: A | Discharge status: Home / Self Care | Payer: Medicaid Other | Attending: Obstetrics & Gynecology | Admitting: Obstetrics & Gynecology

## 2018-03-03 DIAGNOSIS — O4703 False labor before 37 completed weeks of gestation, third trimester: Secondary | ICD-10-CM | POA: Diagnosis not present

## 2018-03-03 DIAGNOSIS — Z3A00 Weeks of gestation of pregnancy not specified: Secondary | ICD-10-CM | POA: Diagnosis not present

## 2018-03-03 MED ORDER — BETAMETHASONE SOD PHOS & ACET 6 (3-3) MG/ML IJ SUSP
12.0000 mg | Freq: Once | INTRAMUSCULAR | Status: AC
  Administered 2018-03-03: 12 mg via INTRAMUSCULAR

## 2018-03-04 LAB — URINE CULTURE: Special Requests: NORMAL

## 2018-03-04 LAB — CULTURE, BETA STREP (GROUP B ONLY)

## 2018-03-08 ENCOUNTER — Other Ambulatory Visit: Payer: Self-pay

## 2018-03-08 ENCOUNTER — Observation Stay
Admission: EM | Admit: 2018-03-08 | Discharge: 2018-03-09 | Disposition: A | Discharge status: Home / Self Care | Payer: Medicaid Other | Attending: Obstetrics and Gynecology | Admitting: Obstetrics and Gynecology

## 2018-03-08 ENCOUNTER — Encounter: Payer: Self-pay | Admitting: Emergency Medicine

## 2018-03-08 DIAGNOSIS — Z3A35 35 weeks gestation of pregnancy: Secondary | ICD-10-CM | POA: Diagnosis not present

## 2018-03-08 DIAGNOSIS — D509 Iron deficiency anemia, unspecified: Secondary | ICD-10-CM | POA: Insufficient documentation

## 2018-03-08 DIAGNOSIS — O99013 Anemia complicating pregnancy, third trimester: Secondary | ICD-10-CM | POA: Insufficient documentation

## 2018-03-08 DIAGNOSIS — O2343 Unspecified infection of urinary tract in pregnancy, third trimester: Secondary | ICD-10-CM | POA: Insufficient documentation

## 2018-03-08 DIAGNOSIS — R51 Headache: Secondary | ICD-10-CM | POA: Diagnosis not present

## 2018-03-08 DIAGNOSIS — O219 Vomiting of pregnancy, unspecified: Secondary | ICD-10-CM

## 2018-03-08 DIAGNOSIS — O23593 Infection of other part of genital tract in pregnancy, third trimester: Secondary | ICD-10-CM | POA: Diagnosis not present

## 2018-03-08 DIAGNOSIS — O4703 False labor before 37 completed weeks of gestation, third trimester: Secondary | ICD-10-CM | POA: Diagnosis not present

## 2018-03-08 DIAGNOSIS — O9989 Other specified diseases and conditions complicating pregnancy, childbirth and the puerperium: Secondary | ICD-10-CM | POA: Diagnosis not present

## 2018-03-08 DIAGNOSIS — O99333 Smoking (tobacco) complicating pregnancy, third trimester: Secondary | ICD-10-CM | POA: Diagnosis not present

## 2018-03-08 DIAGNOSIS — B9629 Other Escherichia coli [E. coli] as the cause of diseases classified elsewhere: Secondary | ICD-10-CM | POA: Insufficient documentation

## 2018-03-08 DIAGNOSIS — Z79899 Other long term (current) drug therapy: Secondary | ICD-10-CM | POA: Diagnosis not present

## 2018-03-08 LAB — CBC
HCT: 32.9 % — ABNORMAL LOW (ref 35.0–47.0)
Hemoglobin: 10.8 g/dL — ABNORMAL LOW (ref 12.0–16.0)
MCH: 27.4 pg (ref 26.0–34.0)
MCHC: 32.8 g/dL (ref 32.0–36.0)
MCV: 83.4 fL (ref 80.0–100.0)
Platelets: 264 10*3/uL (ref 150–440)
RBC: 3.95 MIL/uL (ref 3.80–5.20)
RDW: 15.4 % — AB (ref 11.5–14.5)
WBC: 8.6 10*3/uL (ref 3.6–11.0)

## 2018-03-08 LAB — TYPE AND SCREEN
ABO/RH(D): O POS
Antibody Screen: NEGATIVE

## 2018-03-08 LAB — URINE DRUG SCREEN, QUALITATIVE (ARMC ONLY)
AMPHETAMINES, UR SCREEN: NOT DETECTED
BENZODIAZEPINE, UR SCRN: NOT DETECTED
Barbiturates, Ur Screen: NOT DETECTED
Cannabinoid 50 Ng, Ur ~~LOC~~: POSITIVE — AB
Cocaine Metabolite,Ur ~~LOC~~: NOT DETECTED
MDMA (Ecstasy)Ur Screen: NOT DETECTED
METHADONE SCREEN, URINE: NOT DETECTED
Opiate, Ur Screen: NOT DETECTED
Phencyclidine (PCP) Ur S: NOT DETECTED
TRICYCLIC, UR SCREEN: NOT DETECTED

## 2018-03-08 LAB — URINALYSIS, ROUTINE W REFLEX MICROSCOPIC
Bilirubin Urine: NEGATIVE
Glucose, UA: NEGATIVE mg/dL
KETONES UR: NEGATIVE mg/dL
Nitrite: POSITIVE — AB
Protein, ur: NEGATIVE mg/dL
Specific Gravity, Urine: 1.016 (ref 1.005–1.030)
pH: 6 (ref 5.0–8.0)

## 2018-03-08 MED ORDER — AMOXICILLIN-POT CLAVULANATE 875-125 MG PO TABS: 1.0000 | ORAL_TABLET | Freq: Two times a day (BID) | ORAL | Status: DC

## 2018-03-08 MED ORDER — METRONIDAZOLE 500 MG PO TABS
500.0000 mg | ORAL_TABLET | Freq: Two times a day (BID) | ORAL | Status: DC
  Administered 2018-03-08 (×2): 500 mg via ORAL
  Filled 2018-03-08 (×3): qty 1

## 2018-03-08 MED ORDER — ACETAMINOPHEN 500 MG PO TABS: 1000.0000 mg | ORAL_TABLET | Freq: Four times a day (QID) | ORAL | 0 refills | Status: DC | PRN

## 2018-03-08 MED ORDER — LACTATED RINGERS IV SOLN
INTRAVENOUS | Status: DC
  Administered 2018-03-08 – 2018-03-09 (×3): via INTRAVENOUS

## 2018-03-08 MED ORDER — OXYCODONE HCL 5 MG PO TABS: 5.0000 mg | ORAL_TABLET | Freq: Four times a day (QID) | ORAL | 0 refills | Status: DC | PRN

## 2018-03-08 MED ORDER — SODIUM CHLORIDE 0.9 % IV SOLN
3.0000 g | Freq: Four times a day (QID) | INTRAVENOUS | Status: DC
  Administered 2018-03-08 – 2018-03-09 (×5): 3 g via INTRAVENOUS
  Filled 2018-03-08 (×10): qty 3

## 2018-03-08 MED ORDER — OXYCODONE HCL 5 MG PO TABS
5.0000 mg | ORAL_TABLET | Freq: Four times a day (QID) | ORAL | Status: DC | PRN
  Administered 2018-03-08 (×4): 5 mg via ORAL
  Filled 2018-03-08 (×4): qty 1

## 2018-03-08 MED ORDER — LACTATED RINGERS IV SOLN
500.0000 mL/h | Freq: Once | INTRAVENOUS | Status: AC
  Administered 2018-03-08: 500 mL/h via INTRAVENOUS

## 2018-03-08 MED ORDER — SULFAMETHOXAZOLE-TRIMETHOPRIM 800-160 MG PO TABS
1.0000 | ORAL_TABLET | Freq: Two times a day (BID) | ORAL | Status: DC
  Administered 2018-03-08: 1 via ORAL
  Filled 2018-03-08: qty 1

## 2018-03-08 MED ORDER — ACETAMINOPHEN 500 MG PO TABS
1000.0000 mg | ORAL_TABLET | Freq: Four times a day (QID) | ORAL | Status: DC | PRN
  Administered 2018-03-08 (×4): 1000 mg via ORAL
  Filled 2018-03-08 (×4): qty 2

## 2018-03-08 MED ORDER — CALCIUM CARBONATE ANTACID 500 MG PO CHEW
2.0000 | CHEWABLE_TABLET | Freq: Four times a day (QID) | ORAL | Status: DC | PRN
  Administered 2018-03-08 (×2): 400 mg via ORAL
  Filled 2018-03-08 (×2): qty 2

## 2018-03-08 MED ORDER — SULFAMETHOXAZOLE-TRIMETHOPRIM 800-160 MG PO TABS: 1.0000 | ORAL_TABLET | Freq: Two times a day (BID) | ORAL | 0 refills | Status: DC

## 2018-03-08 NOTE — OB Triage Note (Signed)
Pt arrived in triage from ED with c/o pain on left side of face and lip. Pt reports falling down a flight of stairs at home at approx 0215 when she was on her way to the bathroom. Pt unsure whether she hit abdomen but denies abdominal pain and contractions.  Reports some lower abdominal pressure while walking. Good fetal movement noted. Denies vaginal bleeding. Reports losing control of her bladder upon fall but no leaking of fluid since then. Discussed plan of care with patient. Pt verbalized understanding.

## 2018-03-08 NOTE — Progress Notes (Signed)
Patient ID: Victoria Villarreal, female   DOB: Dec 25, 1995, 22 y.o.   MRN: [id number]  21yo [id number] at 35+4wks after fall down flight of stairs overnight. Denies abuse. Tripped on toy in the dark. Landed on left face and abdomen. Good fetal movement.   Monitoring today reassuring NST: reactive, no decels, +accels, normal baseline  Rh positive. No KB drawn on admission. Urine culture at last visit with ESBL resistent ecoli and evidence of UTI with labs on this admission as well. Did not treat with last dx.  Vitals:   03/08/18 1353 03/08/18 1621  BP: 119/63 (!) 120/53  Pulse: 78 88  Resp: 18 19  Temp: 97.9 F (36.6 C) 97.9 F (36.6 C)  SpO2:     A: Trauma in third pregnancy  P: - 24hrs observation for evidence of placental abruption.  - Fetal wellbeing: Reassuring FHT No evidence of fetal distress - Bacturia in pregnancy: Contact precautions. Treat with bactrim. Dose in house and will send home with script. - Vitals reassuring

## 2018-03-08 NOTE — ED Triage Notes (Signed)
Pt presents to ED after she fell down approx 14 wood steps just prior to arrival. Currently [redacted] weeks pregnant. Pain to the left side of her face. Denies any other injuries.  Pt states she is unsure if she urinated on herself but when she fell she felt wet. Pt reports having lower abd pressure. Denies loc. Answering questions appropriately.

## 2018-03-08 NOTE — H&P (Signed)
Victoria Villarreal is a 22 y.o. female. She is at [redacted]w[redacted]d gestation. Patient's last menstrual period was 07/08/2017 (approximate). Estimated Date of Delivery: 04/08/18 by early [place] at 10+5wk [place]  Prenatal care site: [facility]  Current pregnancy complicated by:  1. Preterm contractions 2. Tobacco use 3. resolved [facility]    Chief complaint: facial pain and abdominal pressure after a fall down a flight of stairs  Pt states that she was up to use the bathroom and slipped on one of her sons toys at the top of the steps. She said she slid down the stairs on her left side, hitting her face, side and buttocks and possibly her abdomen- unsure. Pt denies domestic violence or intent for self-harm.  Location: left side facial/jaw pain- throbbing, hurts to open mouth, bite down or talk.  Associated signs/symptoms: Denies VB, LOF, is aware of UCs- but pressure not pain. Endorses + FM.  she urinated on herself during fall.   S: Resting comfortably.  no VB.no LOF,  Active fetal movement.  Denies: HA, visual changes, SOB, or RUQ/epigastric pain  Maternal Medical History:   Past Medical History:  Diagnosis Date  . Anemia   . Depression   . Trichomoniasis     History reviewed. No pertinent surgical history.  Allergies  Allergen Reactions  . Hydrocodone Rash  . Tramadol Rash    Prior to Admission medications   Medication Sig Start Date End Date Taking? Authorizing Provider  acetaminophen (TYLENOL) 325 MG tablet Take 2 tablets (650 mg total) by mouth every 4 (four) hours as needed (for pain scale < 4  OR  temperature  >/=  100.5 F). 03/02/18  Yes McVey, Rebecca A, CNM  cyclobenzaprine (FLEXERIL) 5 MG tablet Take 5 mg by mouth 3 (three) times daily as needed for muscle spasms.   Yes [provider]  famotidine (PEPCID) 20 MG tablet Take 1 tablet (20 mg total) by mouth at bedtime. 01/01/18  Yes McVey, Prudencio Pair, CNM  Prenatal Vit-Fe Fumarate-FA (PRENATAL MULTIVITAMIN) TABS tablet Take 1  tablet by mouth daily at 12 noon.   Yes [provider]  amoxicillin-clavulanate (AUGMENTIN) 875-125 MG tablet Take 1 tablet by mouth every 12 (twelve) hours. Patient not taking: Reported on 03/08/2018 03/02/18   McVey, Prudencio Pair, CNM  metroNIDAZOLE (METROGEL) 0.75 % vaginal gel Place 1 Applicatorful vaginally at bedtime. Apply one applicatorful to vagina at bedtime for 5 days Patient not taking: Reported on 03/08/2018 01/01/18   McVey, Prudencio Pair, CNM  metroNIDAZOLE (METROGEL) 0.75 % vaginal gel Place 1 Applicatorful vaginally at bedtime. Apply one applicatorful to vagina at bedtime for 5 days Patient not taking: Reported on 03/08/2018 01/01/18   McVey, Prudencio Pair, CNM  ondansetron (ZOFRAN ODT) 4 MG disintegrating tablet Allow 1-2 tablets to dissolve in your mouth every 8 hours as needed for nausea/vomiting Patient not taking: Reported on 12/31/2017 02/23/17   Loleta Rose, MD      Social History: She  reports that she has been smoking cigarettes.  She has been smoking about 0.25 packs per day. She has never used smokeless tobacco. She reports that she drinks about 0.6 oz of alcohol per week. She reports that she does not use drugs.  Family History:  no history of gyn cancers  Review of Systems: A full review of systems was performed and negative except as noted in the HPI.    Prenatal Labs: Blood type/Rh  O Pos  Antibody screen neg  Rubella Immune  Varicella Immune  RPR NR  HBsAg Neg  HIV NR  GC neg  Chlamydia neg  Genetic screening  declined  1 hour GTT  87  GBS  neg on 03/02/18   Anatomy US: 11/17/17: normal anatomy, FHR=138bpm, Cx closed=2.83cm, B/L ov's appear wnl, Position=breech, Placenta=ant  O:  BP 118/69 (BP Location: Left Arm)   Pulse 87   Temp 98.7 F (37.1 C) (Oral)   Resp (!) 22   Ht  (1.702 m)   Wt 177 lb (80.3 kg)   LMP 07/08/2017 (Approximate)   SpO2 99%   BMI 27.72 kg/m  No results found for this or any previous visit (from the past 48 hour(s)).    Constitutional: NAD, AAOx3  HE/ENT: extraocular movements grossly intact, moist mucous membranes; slight swelling of left side of face and lips, no bruising noted.  CV: RRR PULM: nl respiratory effort, CTABL     Abd: gravid, non-tender, non-distended, soft      Ext: Non-tender, Nonedematous   Psych: mood appropriate, speech normal Pelvic: scant vaginal DC noted + whiff  Dilation: 2.5 Effacement (%): 50 Cervical Position: Posterior Station: -3 Presentation: Vertex Exam by:: McVey CNM  Fetal  monitoring: Cat I Appropriate for GA Baseline: 130bpm Variability: moderate Accelerations: present x >2 Decelerations absent Toco: Irregular q3-6min with UI.     A/P: 22 y.o. [redacted]w[redacted]d here s/p slip and fall around 0230   Irregular UCs s/p fall, abd soft and nontender, will monitor continuously x 23hrs for potential abruption; previously had BMZ last week 5/22 and 5/23. Cervical dilation with slight change in one week- will monitor closely for s/sx progressing preterm labor.   Fetal Wellbeing: Reassuring Cat 1 tracing.  UTI- dx at last visit 5/22 ( pt unable to pickup medications from pharmacy), + urine cx ESBL e. Coli, Rx Unasyn IV 3gm q6h  BV, also dx last visit; has not started tx; Rx Flagyl PO.   Facial pain- low suspicion for fx, supportive care with PO analgesia, ice packs. Consider head CT per telephone consult with ED physician if develops significant swelling/bruising.   Routine antenatal orders placed, CBC and T&S now.    McVey, REBECCA A, CNM 03/08/2018  4:42 AM

## 2018-03-09 ENCOUNTER — Other Ambulatory Visit: Payer: Self-pay | Admitting: Obstetrics and Gynecology

## 2018-03-09 DIAGNOSIS — O9989 Other specified diseases and conditions complicating pregnancy, childbirth and the puerperium: Secondary | ICD-10-CM | POA: Diagnosis not present

## 2018-03-09 MED ORDER — METRONIDAZOLE 0.75 % VA GEL: 1.0000 | Freq: Two times a day (BID) | VAGINAL | 0 refills | Status: AC

## 2018-03-09 NOTE — Progress Notes (Unsigned)
LM with pt that there is RX on chart for Oxycodone from Dr Dalbert Garnet and not sure what the problem is with not getting the prescription sent, There is also the antibiotic and the Metrogel e-prescribed.

## 2018-03-09 NOTE — Discharge Summary (Signed)
Obstetric Discharge Summary Reason for Admission: observation/evaluation Prenatal Procedures: NST Intrapartum Procedures: n/a  Hemoglobin  Date Value Ref Range Status  03/08/2018 10.8 (L) 12.0 - 16.0 g/dL Final   HGB  Date Value Ref Range Status  06/09/2014 12.6 12.0 - 16.0 g/dL Final   HCT  Date Value Ref Range Status  03/08/2018 32.9 (L) 35.0 - 47.0 % Final  06/09/2014 39.5 35.0 - 47.0 % Final    Physical Exam:  General: alert, cooperative and appears stated age 22: n/a Fetal monitoring: 135, mod var, +accels, no decels Uterine tone: quiet  Discharge Diagnoses:  1. Trauma in third trimester, uncomplicated 2. ESBL E. coli UTI 3. Bacterial vaginosis 4. Iron deficiency anemia  Discharge Information: Date: 03/09/2018 Activity: unrestricted Diet: routine Medications: PNV, Iron and Bactrim DS Condition: stable and improved Instructions: refer to practice specific booklet and continue antibiotics for BV and UTI, po iron Discharge to: home   [facility] 03/09/2018, 8:04 AM

## 2018-03-18 LAB — OB RESULTS CONSOLE GBS: GBS: POSITIVE

## 2018-03-26 ENCOUNTER — Other Ambulatory Visit: Payer: Self-pay | Admitting: Obstetrics and Gynecology

## 2018-04-01 ENCOUNTER — Inpatient Hospital Stay
Admission: EM | Admit: 2018-04-01 | Discharge: 2018-04-02 | DRG: 806 | Disposition: A | Discharge status: Home / Self Care | Payer: Medicaid Other | Attending: Obstetrics and Gynecology | Admitting: Obstetrics and Gynecology

## 2018-04-01 ENCOUNTER — Other Ambulatory Visit: Payer: Self-pay

## 2018-04-01 DIAGNOSIS — O99324 Drug use complicating childbirth: Secondary | ICD-10-CM | POA: Diagnosis present

## 2018-04-01 DIAGNOSIS — Z3A39 39 weeks gestation of pregnancy: Secondary | ICD-10-CM | POA: Diagnosis not present

## 2018-04-01 DIAGNOSIS — Z3483 Encounter for supervision of other normal pregnancy, third trimester: Secondary | ICD-10-CM | POA: Diagnosis present

## 2018-04-01 DIAGNOSIS — F17210 Nicotine dependence, cigarettes, uncomplicated: Secondary | ICD-10-CM | POA: Diagnosis present

## 2018-04-01 DIAGNOSIS — O99334 Smoking (tobacco) complicating childbirth: Secondary | ICD-10-CM | POA: Diagnosis present

## 2018-04-01 DIAGNOSIS — F1290 Cannabis use, unspecified, uncomplicated: Secondary | ICD-10-CM | POA: Diagnosis present

## 2018-04-01 DIAGNOSIS — O219 Vomiting of pregnancy, unspecified: Secondary | ICD-10-CM

## 2018-04-01 LAB — CBC
HCT: 32.9 % — ABNORMAL LOW (ref 35.0–47.0)
Hemoglobin: 10.7 g/dL — ABNORMAL LOW (ref 12.0–16.0)
MCH: 26.8 pg (ref 26.0–34.0)
MCHC: 32.6 g/dL (ref 32.0–36.0)
MCV: 82.1 fL (ref 80.0–100.0)
PLATELETS: 272 10*3/uL (ref 150–440)
RBC: 4.01 MIL/uL (ref 3.80–5.20)
RDW: 16.5 % — ABNORMAL HIGH (ref 11.5–14.5)
WBC: 10.5 10*3/uL (ref 3.6–11.0)

## 2018-04-01 LAB — URINE DRUG SCREEN, QUALITATIVE (ARMC ONLY)
AMPHETAMINES, UR SCREEN: NOT DETECTED
AMPHETAMINES, UR SCREEN: NOT DETECTED
Amphetamines, Ur Screen: NOT DETECTED
BENZODIAZEPINE, UR SCRN: NOT DETECTED
BENZODIAZEPINE, UR SCRN: NOT DETECTED
Benzodiazepine, Ur Scrn: NOT DETECTED
COCAINE METABOLITE, UR ~~LOC~~: NOT DETECTED
Cannabinoid 50 Ng, Ur ~~LOC~~: POSITIVE — AB
Cannabinoid 50 Ng, Ur ~~LOC~~: NOT DETECTED
Cannabinoid 50 Ng, Ur ~~LOC~~: POSITIVE — AB
Cocaine Metabolite,Ur ~~LOC~~: NOT DETECTED
Cocaine Metabolite,Ur ~~LOC~~: NOT DETECTED
MDMA (ECSTASY) UR SCREEN: NOT DETECTED
MDMA (ECSTASY) UR SCREEN: NOT DETECTED
MDMA (Ecstasy)Ur Screen: NOT DETECTED
METHADONE SCREEN, URINE: NOT DETECTED
METHADONE SCREEN, URINE: NOT DETECTED
METHADONE SCREEN, URINE: NOT DETECTED
OPIATE, UR SCREEN: NOT DETECTED
OPIATE, UR SCREEN: NOT DETECTED
Opiate, Ur Screen: NOT DETECTED
Phencyclidine (PCP) Ur S: NOT DETECTED
Phencyclidine (PCP) Ur S: NOT DETECTED
Phencyclidine (PCP) Ur S: NOT DETECTED
Tricyclic, Ur Screen: NOT DETECTED
Tricyclic, Ur Screen: NOT DETECTED
Tricyclic, Ur Screen: NOT DETECTED

## 2018-04-01 LAB — TYPE AND SCREEN
ABO/RH(D): O POS
Antibody Screen: NEGATIVE

## 2018-04-01 MED ORDER — NICOTINE 14 MG/24HR TD PT24
14.0000 mg | MEDICATED_PATCH | Freq: Every day | TRANSDERMAL | Status: DC
  Administered 2018-04-01: 14 mg via TRANSDERMAL
  Filled 2018-04-01: qty 1

## 2018-04-01 MED ORDER — OXYTOCIN BOLUS FROM INFUSION
500.0000 mL | Freq: Once | INTRAVENOUS | Status: AC
  Administered 2018-04-01: 500 mL via INTRAVENOUS

## 2018-04-01 MED ORDER — FLEET ENEMA 7-19 GM/118ML RE ENEM: 1.0000 | ENEMA | Freq: Every day | RECTAL | Status: DC | PRN

## 2018-04-01 MED ORDER — SODIUM CHLORIDE 0.9% FLUSH: 3.0000 mL | INTRAVENOUS | Status: DC | PRN

## 2018-04-01 MED ORDER — AMOXICILLIN-POT CLAVULANATE 875-125 MG PO TABS
1.0000 | ORAL_TABLET | Freq: Two times a day (BID) | ORAL | Status: DC
  Administered 2018-04-01 – 2018-04-02 (×2): 1 via ORAL
  Filled 2018-04-01 (×3): qty 1

## 2018-04-01 MED ORDER — OXYTOCIN 40 UNITS IN LACTATED RINGERS INFUSION - SIMPLE MED
1.0000 m[IU]/min | INTRAVENOUS | Status: DC
  Administered 2018-04-01: 2 m[IU]/min via INTRAVENOUS

## 2018-04-01 MED ORDER — BENZOCAINE-MENTHOL 20-0.5 % EX AERO
1.0000 "application " | INHALATION_SPRAY | CUTANEOUS | Status: DC | PRN
  Filled 2018-04-01: qty 56

## 2018-04-01 MED ORDER — LACTATED RINGERS IV SOLN: 500.0000 mL | INTRAVENOUS | Status: DC | PRN

## 2018-04-01 MED ORDER — MEASLES, MUMPS & RUBELLA VAC ~~LOC~~ INJ
0.5000 mL | INJECTION | Freq: Once | SUBCUTANEOUS | Status: DC
  Filled 2018-04-01: qty 0.5

## 2018-04-01 MED ORDER — COCONUT OIL OIL: 1.0000 "application " | TOPICAL_OIL | Status: DC | PRN

## 2018-04-01 MED ORDER — ZOLPIDEM TARTRATE 5 MG PO TABS: 5.0000 mg | ORAL_TABLET | Freq: Every evening | ORAL | Status: DC | PRN

## 2018-04-01 MED ORDER — MISOPROSTOL 25 MCG QUARTER TABLET: 25.0000 ug | ORAL_TABLET | ORAL | Status: DC | PRN

## 2018-04-01 MED ORDER — FENTANYL 2.5 MCG/ML W/ROPIVACAINE 0.15% IN NS 100 ML EPIDURAL (ARMC)
EPIDURAL | Status: AC
  Filled 2018-04-01: qty 100

## 2018-04-01 MED ORDER — TERBUTALINE SULFATE 1 MG/ML IJ SOLN: 0.2500 mg | Freq: Once | INTRAMUSCULAR | Status: DC | PRN

## 2018-04-01 MED ORDER — SOD CITRATE-CITRIC ACID 500-334 MG/5ML PO SOLN: 30.0000 mL | ORAL | Status: DC | PRN

## 2018-04-01 MED ORDER — OXYTOCIN 40 UNITS IN LACTATED RINGERS INFUSION - SIMPLE MED: 1.0000 m[IU]/min | INTRAVENOUS | Status: DC

## 2018-04-01 MED ORDER — ACETAMINOPHEN 325 MG PO TABS
650.0000 mg | ORAL_TABLET | ORAL | Status: DC | PRN
  Administered 2018-04-01 – 2018-04-02 (×2): 650 mg via ORAL
  Filled 2018-04-01 (×2): qty 2

## 2018-04-01 MED ORDER — MISOPROSTOL 200 MCG PO TABS
ORAL_TABLET | ORAL | Status: AC
  Filled 2018-04-01: qty 4

## 2018-04-01 MED ORDER — BUTORPHANOL TARTRATE 2 MG/ML IJ SOLN
1.0000 mg | INTRAMUSCULAR | Status: DC | PRN
  Administered 2018-04-01 (×2): 2 mg via INTRAVENOUS
  Filled 2018-04-01: qty 1

## 2018-04-01 MED ORDER — DIBUCAINE 1 % RE OINT: 1.0000 "application " | TOPICAL_OINTMENT | RECTAL | Status: DC | PRN

## 2018-04-01 MED ORDER — DIPHENHYDRAMINE HCL 25 MG PO CAPS: 25.0000 mg | ORAL_CAPSULE | Freq: Four times a day (QID) | ORAL | Status: DC | PRN

## 2018-04-01 MED ORDER — TERBUTALINE SULFATE 1 MG/ML IJ SOLN
0.2500 mg | Freq: Once | INTRAMUSCULAR | Status: DC | PRN
Start: 2018-04-01 — End: 2018-04-01

## 2018-04-01 MED ORDER — SODIUM CHLORIDE 0.9 % IV SOLN: 250.0000 mL | INTRAVENOUS | Status: DC | PRN

## 2018-04-01 MED ORDER — ACETAMINOPHEN 325 MG PO TABS: 650.0000 mg | ORAL_TABLET | ORAL | Status: DC | PRN

## 2018-04-01 MED ORDER — SIMETHICONE 80 MG PO CHEW
80.0000 mg | CHEWABLE_TABLET | ORAL | Status: DC | PRN
  Filled 2018-04-01: qty 1

## 2018-04-01 MED ORDER — ONDANSETRON HCL 4 MG PO TABS: 4.0000 mg | ORAL_TABLET | ORAL | Status: DC | PRN

## 2018-04-01 MED ORDER — SODIUM CHLORIDE 0.9 % IV SOLN
3.0000 g | Freq: Four times a day (QID) | INTRAVENOUS | Status: DC
  Administered 2018-04-01: 3 g via INTRAVENOUS
  Filled 2018-04-01 (×5): qty 3

## 2018-04-01 MED ORDER — SODIUM CHLORIDE 0.9 % IV SOLN
2.0000 g | Freq: Once | INTRAVENOUS | Status: AC
  Administered 2018-04-01: 2 g via INTRAVENOUS
  Filled 2018-04-01: qty 2000

## 2018-04-01 MED ORDER — BISACODYL 10 MG RE SUPP: 10.0000 mg | Freq: Every day | RECTAL | Status: DC | PRN

## 2018-04-01 MED ORDER — PRENATAL MULTIVITAMIN CH
1.0000 | ORAL_TABLET | Freq: Every day | ORAL | Status: DC
  Administered 2018-04-02: 1 via ORAL
  Filled 2018-04-01: qty 1

## 2018-04-01 MED ORDER — WITCH HAZEL-GLYCERIN EX PADS: 1.0000 "application " | MEDICATED_PAD | CUTANEOUS | Status: DC | PRN

## 2018-04-01 MED ORDER — ONDANSETRON HCL 4 MG/2ML IJ SOLN: 4.0000 mg | INTRAMUSCULAR | Status: DC | PRN

## 2018-04-01 MED ORDER — ONDANSETRON HCL 4 MG/2ML IJ SOLN: 4.0000 mg | Freq: Four times a day (QID) | INTRAMUSCULAR | Status: DC | PRN

## 2018-04-01 MED ORDER — BUTORPHANOL TARTRATE 2 MG/ML IJ SOLN
INTRAMUSCULAR | Status: AC
  Filled 2018-04-01: qty 1

## 2018-04-01 MED ORDER — LIDOCAINE HCL (PF) 1 % IJ SOLN
30.0000 mL | INTRAMUSCULAR | Status: DC | PRN
  Filled 2018-04-01: qty 30

## 2018-04-01 MED ORDER — SENNOSIDES-DOCUSATE SODIUM 8.6-50 MG PO TABS
2.0000 | ORAL_TABLET | ORAL | Status: DC
  Administered 2018-04-02: 2 via ORAL
  Filled 2018-04-01: qty 2

## 2018-04-01 MED ORDER — OXYTOCIN 40 UNITS IN LACTATED RINGERS INFUSION - SIMPLE MED
2.5000 [IU]/h | INTRAVENOUS | Status: DC
  Administered 2018-04-01: 2.5 [IU]/h via INTRAVENOUS
  Filled 2018-04-01 (×2): qty 1000

## 2018-04-01 MED ORDER — SODIUM CHLORIDE 0.9% FLUSH: 3.0000 mL | Freq: Two times a day (BID) | INTRAVENOUS | Status: DC

## 2018-04-01 MED ORDER — OXYCODONE HCL 5 MG PO TABS
5.0000 mg | ORAL_TABLET | ORAL | Status: DC | PRN
  Administered 2018-04-01 – 2018-04-02 (×4): 5 mg via ORAL
  Filled 2018-04-01 (×4): qty 1

## 2018-04-01 MED ORDER — IBUPROFEN 600 MG PO TABS
600.0000 mg | ORAL_TABLET | Freq: Four times a day (QID) | ORAL | Status: DC
  Administered 2018-04-01 – 2018-04-02 (×4): 600 mg via ORAL
  Filled 2018-04-01 (×4): qty 1

## 2018-04-01 MED ORDER — LACTATED RINGERS IV SOLN
INTRAVENOUS | Status: DC
  Administered 2018-04-01 (×2): via INTRAVENOUS

## 2018-04-01 MED ORDER — DIPHENHYDRAMINE HCL 50 MG/ML IJ SOLN: 25.0000 mg | Freq: Once | INTRAMUSCULAR | Status: DC

## 2018-04-01 MED ORDER — TETANUS-DIPHTH-ACELL PERTUSSIS 5-2.5-18.5 LF-MCG/0.5 IM SUSP: 0.5000 mL | Freq: Once | INTRAMUSCULAR | Status: DC

## 2018-04-01 NOTE — Discharge Instructions (Signed)
Cannabis Use Disorder Cannabis use disorder is when using marijuana disrupts a person's daily life or causes health problems. This condition can be dangerous. The health problems this condition can cause include:  Long-lasting problems with thinking and learning. These can be permanent in young people.  Severe anxiety.  Paranoia.  Hallucinations.  Dangerously high blood pressure and heart rate.  Schizophrenia.  Breathing problems.  Problems with child development during and after pregnancy.  People with this condition are also more likely to use other drugs. What are the causes? This condition is caused by using marijuana too much over time. It is not caused by using it only once in a while. Many people with this condition use marijuana because it gives them a feeling of extreme pleasure or relaxation. What increases the risk? This condition is more likely to develop in:  Men.  People with a family history of cannabis use disorder.  People with mental health issues such as depression or post-traumatic stress disorder.  What are the signs or symptoms? Symptoms of this condition include:  Using greater amounts of marijuana than you want to, or using marijuana for longer than you want to.  Craving marijuana.  Spending a lot of time getting marijuana and using it or recovering from its effects.  Having problems at work, at school, at home, or in relationships because of marijuana use.  Giving up or cutting down on important life activities because of marijuana use.  Using marijuana at times when it is dangerous, such as while you are driving a car.  Needing more and more marijuana to get the same effect you want from the marijuana (building up a tolerance).  Physical problems, such as: ? A long-lasting cough. ? Bronchitis. ? Emphysema. ? Throat and lung cancer.  Mental problems, such as: ? Psychosis. ? Anxiety. ? Trouble sleeping.  Having symptoms of withdrawal  when you stop using marijuana. Symptom of withdrawal include: ? Irritability or anger. ? Anxiety or restlessness. ? Trouble sleeping. ? Loss of appetite or weight loss. ? Aches and pains. ? Shakiness, sweating, or chills.  How is this diagnosed? This condition is diagnosed with an assessment. During the assessment, your health care provider will ask about your marijuana use and about how it affects your life. You will be diagnosed with the condition if you have had at least two symptoms of this condition within a 77-month period. How severe the condition is depends on how many symptoms you have.  If you have two to three symptoms, your condition is mild.  If you have four to five symptoms, your condition is moderate.  If you have six or more symptoms, your condition is severe.  Your health care provider may perform a physical exam or do lab tests to see if you have physical problems resulting from marijuana use. Your health care provider may also screen for drug use and refer you to a mental health professional for evaluation. How is this treated? Treatment for this condition is usually provided by mental health professionals with training in substance use disorders. Your treatment may involve:  Counseling. This treatment is also called talk therapy. It is provided by substance use treatment counselors. A counselor can address the reasons you use marijuana and suggest ways to keep you from using it again. The goals of talk therapy are to: ? Find healthy activities to replace using marijuana. ? Identify and avoid the things that trigger your marijuana use. ? Help you learn how to handle  cravings.  Support groups. Support groups are led by people who have quit using marijuana. They provide emotional support, advice, and guidance.  Medicine. Medicine is used to treat mental health issues that trigger marijuana use or that result from it.  Follow these instructions at home:  Take  over-the-counter and prescription medicines only as told by your health care provider.  Check with your health care provider before starting any new medicines.  Keep all follow-up visits as told by your health care provider. This is important.  Work with Photographer or group to develop tools to keep you from using marijuana again (relapsing).  Make healthy lifestyle choices, such as: ? Eating a healthy diet. ? Getting enough exercise. ? Improving your stress-management skills.  Learn daily living skills and work Programmer, applications. Where to find more information:  [employer] on Drug Abuse: http://www.price-smith.com/  Substance Abuse and Mental Health Services Administration: SkateOasis.com.pt Contact a health care provider if:  You are not able to take your medicines as told.  Your symptoms get worse. Get help right away if:  You have serious thoughts about hurting yourself or others. If you ever feel like you may hurt yourself or others, or have thoughts about taking your own life, get help right away. You can go to your nearest emergency department or call:  Your local emergency services (911 in the [place]  A suicide crisis helpline, such as the National Suicide Prevention Lifeline at (775) 470-5428. This is open 24 hours a day.  This information is not intended to replace advice given to you by your health care provider. Make sure you discuss any questions you have with your health care provider. Document Released: 09/25/2000 Document Revised: 06/26/2016 Document Reviewed: 06/26/2016 Elsevier Interactive Patient Education  2018 Elsevier Inc. Vaginal Delivery, Care After Refer to this sheet in the next few weeks. These instructions provide you with information about caring for yourself after vaginal delivery. Your health care provider may also give you more specific instructions. Your treatment has been planned according to current medical practices, but problems sometimes occur. Call your  health care provider if you have any problems or questions. What can I expect after the procedure? After vaginal delivery, it is common to have:  Some bleeding from your vagina.  Soreness in your abdomen, your vagina, and the area of skin between your vaginal opening and your anus (perineum).  Pelvic cramps.  Fatigue.  Follow these instructions at home: Medicines  Take over-the-counter and prescription medicines only as told by your health care provider.  If you were prescribed an antibiotic medicine, take it as told by your health care provider. Do not stop taking the antibiotic until it is finished. Driving   Do not drive or operate heavy machinery while taking prescription pain medicine.  Do not drive for 24 hours if you received a sedative. Lifestyle  Do not drink alcohol. This is especially important if you are breastfeeding or taking medicine to relieve pain.  Do not use tobacco products, including cigarettes, chewing tobacco, or e-cigarettes. If you need help quitting, ask your health care provider. Eating and drinking  Drink at least 8 eight-ounce glasses of water every day unless you are told not to by your health care provider. If you choose to breastfeed your baby, you may need to drink more water than this.  Eat high-fiber foods every day. These foods may help prevent or relieve constipation. High-fiber foods include: ? Whole grain cereals and breads. ? Brown rice. ? Beans. ?  Fresh fruits and vegetables. Activity  Return to your normal activities as told by your health care provider. Ask your health care provider what activities are safe for you.  Rest as much as possible. Try to rest or take a nap when your baby is sleeping.  Do not lift anything that is heavier than your baby or 10 lb (4.5 kg) until your health care provider says that it is safe.  Talk with your health care provider about when you can engage in sexual activity. This may depend on  your: ? Risk of infection. ? Rate of healing. ? Comfort and desire to engage in sexual activity. Vaginal Care  If you have an episiotomy or a vaginal tear, check the area every day for signs of infection. Check for: ? More redness, swelling, or pain. ? More fluid or blood. ? Warmth. ? Pus or a bad smell.  Do not use tampons or douches until your health care provider says this is safe.  Watch for any blood clots that may pass from your vagina. These may look like clumps of dark red, brown, or black discharge. General instructions  Keep your perineum clean and dry as told by your health care provider.  Wear loose, comfortable clothing.  Wipe from front to back when you use the toilet.  Ask your health care provider if you can shower or take a bath. If you had an episiotomy or a perineal tear during labor and delivery, your health care provider may tell you not to take baths for a certain length of time.  Wear a bra that supports your breasts and fits you well.  If possible, have someone help you with household activities and help care for your baby for at least a few days after you leave the hospital.  Keep all follow-up visits for you and your baby as told by your health care provider. This is important. Contact a health care provider if:  You have: ? Vaginal discharge that has a bad smell. ? Difficulty urinating. ? Pain when urinating. ? A sudden increase or decrease in the frequency of your bowel movements. ? More redness, swelling, or pain around your episiotomy or vaginal tear. ? More fluid or blood coming from your episiotomy or vaginal tear. ? Pus or a bad smell coming from your episiotomy or vaginal tear. ? A fever. ? A rash. ? Little or no interest in activities you used to enjoy. ? Questions about caring for yourself or your baby.  Your episiotomy or vaginal tear feels warm to the touch.  Your episiotomy or vaginal tear is separating or does not appear to be  healing.  Your breasts are painful, hard, or turn red.  You feel unusually sad or worried.  You feel nauseous or you vomit.  You pass large blood clots from your vagina. If you pass a blood clot from your vagina, save it to show to your health care provider. Do not flush blood clots down the toilet without having your health care provider look at them.  You urinate more than usual.  You are dizzy or light-headed.  You have not breastfed at all and you have not had a menstrual period for 12 weeks after delivery.  You have stopped breastfeeding and you have not had a menstrual period for 12 weeks after you stopped breastfeeding. Get help right away if:  You have: ? Pain that does not go away or does not get better with medicine. ? Chest pain. ?  Difficulty breathing. ? Blurred vision or spots in your vision. ? Thoughts about hurting yourself or your baby.  You develop pain in your abdomen or in one of your legs.  You develop a severe headache.  You faint.  You bleed from your vagina so much that you fill two sanitary pads in one hour. This information is not intended to replace advice given to you by your health care provider. Make sure you discuss any questions you have with your health care provider. Document Released: 09/25/2000 Document Revised: 03/11/2016 Document Reviewed: 10/13/2015 Elsevier Interactive Patient Education  2018 [employer].

## 2018-04-01 NOTE — Progress Notes (Signed)
Notes completed

## 2018-04-01 NOTE — H&P (Addendum)
OB History & Physical   History of Present Illness:  Chief Complaint: Here for IOL at 39 weeks for IOL as pt has advanced cx dilation and wants to make sure she gets an epidural due to rapid delivery last baby.  HPI:  JAYLEAN BUENAVENTURA is a 22 y.o. [phone] female at [redacted]w[redacted]d dated by .  She presents to L&D for IOL with LMP of 07/08/17 & EDD of 04/08/18. Pt requested to be induced based on anxiety about delivering prior to coming to hospital and needing epidural. Lat labor was 4 hours.  +FM, no CTX, no LOF, no VB  Pregnancy Issues: 1. Multip with rapid labor 4 hrs total last time 2. ESBL urine with tx 3. Fall during pregnancy but,improved 4. Tobacco Abuse  Maternal Medical History:   Past Medical History:  Diagnosis Date  . Anemia   . Depression   . Trichomoniasis     Past Surgical History:  Procedure Laterality Date  . NO PAST SURGERIES      Allergies  Allergen Reactions  . Hydrocodone Rash  . Tramadol Rash    Prior to Admission medications   Medication Sig Start Date End Date Taking? Authorizing Provider  famotidine (PEPCID) 20 MG tablet Take 1 tablet (20 mg total) by mouth at bedtime. 01/01/18  Yes McVey, Prudencio Pair, CNM  Prenatal Vit-Fe Fumarate-FA (PRENATAL MULTIVITAMIN) TABS tablet Take 1 tablet by mouth daily at 12 noon.   Yes [provider]  cyclobenzaprine (FLEXERIL) 5 MG tablet Take 5 mg by mouth 3 (three) times daily as needed for muscle spasms.    [provider]  ondansetron (ZOFRAN ODT) 4 MG disintegrating tablet Allow 1-2 tablets to dissolve in your mouth every 8 hours as needed for nausea/vomiting Patient not taking: Reported on 12/31/2017 02/23/17   Loleta Rose, MD  oxyCODONE (OXY IR/ROXICODONE) 5 MG immediate release tablet Take 1 tablet (5 mg total) by mouth every 6 (six) hours as needed for severe pain or breakthrough pain. Patient not taking: Reported on 04/01/2018 03/08/18   Christeen Douglas, MD  Bactrim/DS for ESBL urine   Prenatal care  site: [facility]  Social History: She  reports that she has been smoking cigarettes.  She has been smoking about 0.25 packs per day. She has never used smokeless tobacco. She reports that she drinks about 0.6 oz of alcohol per week. She reports that she does not use drugs.  Family History: family history is not on file.   Review of Systems: A full review of systems was performed and negative except as noted in the HPI.     Physical Exam:  Vital Signs: LMP 07/08/2017 (Approximate)  General: no acute distress.  HEENT: normocephalic, atraumatic Heart: regular rate & rhythm.  No murmurs/rubs/gallops Lungs: clear to auscultation bilaterally, normal respiratory effort Abdomen: soft, gravid, non-tender;  EFW: 6#7oz Pelvic:   External: Normal external female genitalia  Cervix: Dilation: 3 / Effacement (%): 70 / Station: -3    Extremities: non-tender, symmetric,  edema bilaterally.   Neurologic: Alert & oriented x 3.    Results for orders placed or performed during the hospital encounter of 04/01/18 (from the past 24 hour(s))  CBC     Status: Abnormal   Collection Time: 04/01/18  8:38 AM  Result Value Ref Range   WBC 10.5 3.6 - 11.0 K/uL   RBC 4.01 3.80 - 5.20 MIL/uL   Hemoglobin 10.7 (L) 12.0 - 16.0 g/dL   HCT 45.4 (L) 09.8 - 11.9 %  MCV 82.1 80.0 - 100.0 fL   MCH 26.8 26.0 - 34.0 pg   MCHC 32.6 32.0 - 36.0 g/dL   RDW 16.116.5 (H) 09.611.5 - 04.514.5 %   Platelets 272 150 - 440 K/uL    Pertinent Results:  Prenatal Labs: Blood type/Rh  O pos  Antibody screen neg  Rubella Immune  Varicella Immune  RPR NR  HBsAg Neg  HIV NR  GC neg  Chlamydia neg  Genetic screening negative  1 hour GTT 87  3 hour GTT N/A  GBS Positive   FHT:140 TOCO:None SVE:  Dilation: 3 / Effacement (%): 70 / Station: -3    Cephalic by leopolds    Assessment:  Earnestine Mealingmari S Grand is a 22 y.o. [phone] female at 3368w0d with IOL for advanced cx dilation,. Pt has had an ESBL urine and it has not cleared  yet. Will tx and use contact precautions.   Plan:  1. Admit to Labor & Delivery 2. CBC, T&S, Clrs, IVF 3. GBS  Neg 4. Consents obtained. 5. Continuous efm/toco 6. Will AROM when able Report to back up Dr Karleen HampshireJSchermerhorn and agrees with plan of care.  ----- Myrtie Cruisearon W. Merriel Zinger,RN, MSN, CNM, FNP Certified Nurse Midwife [facility]

## 2018-04-01 NOTE — Progress Notes (Signed)
Pt arrived on the unit a few minutes after 0800 for IOL.Pt initial UDS results showed positive Marijuana. Pt was alert and oriented. RN was told by a fellow nurse later in the day that my patient had yelled out from her room asking for her water to be broken. When I entered the room the patient was acting intoxicated and lethargic. I notified her provider who then ordered a second UDS. The second UDS looked like water and the results showed no drugs in her system. Provider has now ordered another UDS to be drawn after her epidural is placed and foley inserted.

## 2018-04-01 NOTE — Progress Notes (Addendum)
S: Resting comfortably without  epidural. + CTX, no LOF, VB. Pt has become belligerant at times. Her personality has changed since getting a urine drug screen so will repeat now.  O: Vitals:   04/01/18 0912 04/01/18 0930 04/01/18 1030 04/01/18 1130  BP: 113/73 126/68 112/68 121/63  Pulse: 89 82 75 79  Resp: 16     Temp: 97.6 F (36.4 C)     TempSrc: Oral     Weight: 181 lb (82.1 kg)     Height: 5\' 7"  (1.702 m)       Gen: NAD, AAOx3      Abd: FNTTP      Ext: Non-tender, Nonedmeatous    FHT: mod var + accelerations no decelerations TOCO: Q 1.5-2 min SVE: Tight 3/70%/vtx-3   A/P:  22 y.o. yo G4P3003 at 3665w0d for IOL.   Labor: progressing on Pitocin at   mun/min  ESBL urine from 03/02/18 indicated E Coli and pt has not been adequately tx. Will give Unasyn 3 gms IV q 4 hours and it is also sensitive to GBS. Will stop Ampicillin that was originally ordered due to GBS pos.   FWB: Reassuring Cat 1 tracing.   GBS:pos Will rescreen urine drug screen as pt has had a personality change since admission.  Myrtie Cruisearon W. Vandora Jaskulski,RN, MSN, CNM, FNP Certified Nurse Midwife [facility]  Sharee Pimplearon W Maxtyn Nuzum 12:00 PM

## 2018-04-01 NOTE — Discharge Summary (Addendum)
Obstetrical Discharge Summary  Patient Name: Victoria Villarreal DOB: 1996/07/29 MRN: [id number]  Date of Admission: 04/01/2018 Date of Delivery: 04/01/18 Delivered by: Milon Scorearon Ermagene Saidi, RN, CNM Date of Discharge: 04/02/18 [facility] NWG:[id number]:Patient's last menstrual period was 07/08/2017 (approximate). EDC Estimated Date of Delivery: 04/08/18 Gestational Age at Delivery: 5376w0d   Antepartum complications: ESBL urine from 03/09/18, tx with Unasyn 3 gms IV q 6 hours, GBS pos Admitting Diagnosis: IUP at 39 weeks for IOL due to rapid deliveries and GBS Pos Secondary Diagnosis:ESBL urine being tx with Augmentin 875 mg BID x 14 days , waiting repeat urine culture sent from hospital Patient Active Problem List   Diagnosis Date Noted  . Indication for care in labor or delivery 04/01/2018  . Labor and delivery, indication for care 03/08/2018  . Preterm uterine contractions in third trimester, antepartum 03/02/2018  . Nausea and vomiting during pregnancy 12/31/2017  . Nausea/vomiting in pregnancy 12/31/2017  . Pregnancy 03/05/2016  . No leakage of amniotic fluid into vagina 12/14/2015    Augmentation: AROM, Pitocin  Complications: Pt did not have an epidural and she is upset as that is part of the reason she wanted to be Induced was to have enough time. She however did get Stadol IV. Pt was in pain and it was difficult to monitor her baby. +MJ in Drug screen.Did not get epidural due to 2nd drug test requested by Anesthesia prior to admin epidural.  Intrapartum complications/course: Rapid 2nd stage Date of Delivery: 04/01/18 Delivered By: Sharee Pimplearon W. Braydyn Schultes, RN, MS, CNM, FNP Delivery Type: NSVD of viable female infant  Anesthesia: None Placenta: sponatneous Laceration: Tiny non-repaired lt perineal 0.25 cm lac, non-bleeding Episiotomy: none Newborn Data: Live born female  Birth Weight: 6 lb 15.8 oz (3170 g) APGAR: 8, 9  Newborn Delivery   Birth date/time:  04/01/2018 18:21:00 Delivery  type:  Vaginal, Spontaneous     Postpartum Procedures: N/A  Post partum course: Patient had an uncomplicated postpartum course.  By time of discharge on PPD#1, her pain was controlled on oral pain medications; she had appropriate lochia and was ambulating, voiding without difficulty and tolerating regular diet.  She was deemed stable for discharge to home.   Discharge Physical Exam:  BP 116/77   Pulse 62   Temp 97.7 F (36.5 C) (Oral)   Resp 16   Ht 5\' 7"  (1.702 m)   Wt 181 lb (82.1 kg)   LMP 07/08/2017 (Approximate)   Breastfeeding? Unknown   BMI 28.35 kg/m   General: NAD CV: RRR Pulm: CTABL, nl effort ABD: s/nd/nt, fundus firm and below the umbilicus Lochia: moderate Incision: c/d/i DVT Evaluation: LE non-ttp, no evidence of DVT on exam.  Hemoglobin  Date Value Ref Range Status  04/01/2018 10.7 (L) 12.0 - 16.0 g/dL Final   HGB  Date Value Ref Range Status  06/09/2014 12.6 12.0 - 16.0 g/dL Final   HCT  Date Value Ref Range Status  04/01/2018 32.9 (L) 35.0 - 47.0 % Final  06/09/2014 39.5 35.0 - 47.0 % Final   Disposition: stable, discharge to home. Baby Feeding: formula Baby Disposition: home with mom  Rh Immune globulin given: O pos Rubella vaccine given: RI Tdap vaccine given in AP or PP setting: 01/12/18 Flu vaccine given in AP or PP setting: declined  Contraception: will decide pp  Prenatal Labs:   O pos, RPR NR, GC/CH neg, RI, Antibody neg, GBS pos, VI, HIV NR,     Plan:  Jaydin S Deboer was  discharged to home in good condition. Follow-up appointment with delivering provider in 6 weeks. Pt needs to take Augmentin 875 mg po BID x 14 days (already took 1 day's worth) Continue prenatal vits daily. Call office for appt at 6 weeks postpartum.  Decide which birth control you want and we can prescribe at 6 weeks  Discharge Medications: PNV, Ibuprofen, Augmentin  ____________________ Signed: Myrtie Cruise, MSN, CNM, FNP Certified Nurse  Midwife [facility]

## 2018-04-02 LAB — RPR: RPR Ser Ql: NONREACTIVE

## 2018-04-02 LAB — CBC
HCT: 31.9 % — ABNORMAL LOW (ref 35.0–47.0)
HEMOGLOBIN: 10.2 g/dL — AB (ref 12.0–16.0)
MCH: 26.4 pg (ref 26.0–34.0)
MCHC: 32.1 g/dL (ref 32.0–36.0)
MCV: 82.2 fL (ref 80.0–100.0)
Platelets: 238 10*3/uL (ref 150–440)
RBC: 3.88 MIL/uL (ref 3.80–5.20)
RDW: 16.2 % — ABNORMAL HIGH (ref 11.5–14.5)
WBC: 11.4 10*3/uL — ABNORMAL HIGH (ref 3.6–11.0)

## 2018-04-02 MED ORDER — AMOXICILLIN-POT CLAVULANATE 875-125 MG PO TABS: 1.0000 | ORAL_TABLET | Freq: Two times a day (BID) | ORAL | 0 refills | Status: DC

## 2018-04-02 NOTE — Progress Notes (Signed)
Pt discharged with significant other and baby by wheelchair per Bobbi, nt. Discharge instructions given pts belongings with pt and pt verbalized no concerns at this time

## 2018-04-02 NOTE — Clinical Social Work Note (Signed)
CSW received consult about drug exposed newborn. CSW will assess when able.  Victoria PonderKaren Martha Marques Villarreal, MSW, Theresia MajorsLCSWA [phone]

## 2018-04-02 NOTE — Progress Notes (Signed)
RN was in patient's room and young child in patient's bed turned over and in the process moved the sheets on the bed to reveal a bottle of beer. Patient quickly grabbed the bottle and said that one of her visits left it and attempted to call a visitor to come and get it. RN left the room and returned within 5 min and asked the patient where the beer was. She stated that she put it in one of her bags and that it was unopened. RN called the supervisor , Dewayne Hatchnn, who came up and confronted the patient about the beer. She explained that it is against hospital policy and removed the beer from the room and stated that she would be giving it to security.The beer was found to be unopened. Patient told RN that she now feels uncomfortable since she was confronted and that beer was taken which she states it  wasn't hers. RN again stated that it is against hospital policy and is a safety concern in regards to her baby for us to allow alcohol in our hospital rooms.

## 2018-04-02 NOTE — Progress Notes (Signed)
MD notified of pt tcb and hearing test pass and mom wanting to be DC home. MD to put in discharge orders

## 2018-04-02 NOTE — Progress Notes (Addendum)
Post Partum Day 1 Subjective: I would like to go home today if possible  Objective: Blood pressure 118/72, pulse 73, temperature 97.7 F (36.5 C), temperature source Oral, resp. rate 17, height 5\' 7"  (1.702 m), weight 181 lb (82.1 kg), last menstrual period 07/08/2017, SpO2 100 %, unknown if currently breastfeeding.  Physical Exam:  General: A,A&Ox3 HEART:S1S2, RRR, No M/R/G LUNGS:CTA BILAT, no W/R/R Lochia: mod, no clts Uterine Fundus: firm, U-1 DVT Evaluation: Neg Homans Voiding WNL Recent Labs    04/01/18 0838 04/02/18 0548  HGB 10.7* 10.2*  HCT 32.9* 31.9*  WBC 10.5 11.4*  PLT 272 238    Assessment/Plan: A:1. PPD#1 stable 2. Marijuana Use in pregnancy 3. Tobacco Abuse P:1. DC home today if baby passes hearing test and can go home 2. Pt instructions: MJ use and stopping included in DC  Instructions 3. ESBL urine:Advised pt needs to be on her Augmentin for 14 days.    LOS: 1 day   Sharee PimpleCaron W Jones 04/02/2018, 8:15 AM

## 2018-04-02 NOTE — Clinical Social Work Maternal (Signed)
  CLINICAL SOCIAL WORK MATERNAL/CHILD NOTE  Patient Details  Name: Victoria Villarreal MRN: [id number] Date of Birth: [birth year]  Date:  04/02/2018  Clinical Social Worker Initiating Note:  Santiago Bumpers, MSW, [place] Date/Time: Initiated:  04/02/18/1622     Child's Name:  Victoria Villarreal   Biological Parents:  Mother, Father   Need for Interpreter:  None   Reason for Referral:  Current Substance Use/Substance Use During Pregnancy    Address:  [place] [address]    Phone number:  [phone] (home)     Additional phone number: [phone]  Household Members/Support Persons (HM/SP):   Household Member/Support Person 1, Household Member/Support Person 2, Household Member/Support Person 3, Household Member/Support Person 4   HM/SP Name Relationship DOB or Age  HM/SP -11 Victoria Villarreal Husband    HM/SP -2 Victoria Villarreal daughter 6  HM/SP -3 Victoria Villarreal Daughter 5  HM/SP -4 Victoria Villarreal Villarreal Son 2  HM/SP -5        HM/SP -6        HM/SP -7        HM/SP -8          Natural Supports (not living in the home):  Friends, Immediate Family   Professional Supports: Case Metallurgist   Employment: Unemployed   Type of Work: None   Education:  9 to 11 years   Homebound arranged: No  Financial Resources:  [employer]   Other Resources:  Theatre stage manager Considerations Which May Impact Care:  None reported  Strengths:  Ability to meet basic needs , Compliance with medical plan , Home prepared for child , Understanding of illness, Pediatrician chosen   Psychotropic Medications:         Pediatrician:    [employer]  Pediatrician List:   [place] Other([facility])  [facility]      Pediatrician Fax Number:    Risk Factors/Current Problems:  Substance Use , Mental Health Concerns    Cognitive State:  Alert , Able to Concentrate , Linear  Thinking    Mood/Affect:  Flat , Irritable    CSW Assessment: The CSW met with the patient and her spouse at bedside to discuss the patient's UDS results. The patient verbalized permission for the spouse to remain in the room during the discussion. The CSW explained the CPS mandated reporting process and updated that the infants UDS is clear; however, the cord blood is pending. The CSW explained that if the cord blood shows positive results, the CSW is mandated to report such to CPS. The patient verbalized understanding.  According to the nursing staff, the patient was found with an unopened beer in bed with her the night previous resulting in security entering the room to advise the patient that alcohol is prohibited on [facility] premises. The patient has also shown s/s of labile mood with staff, and her affect was blunted throughout the assessment. The patient was compliant with giving requested information about her current children; however, she was guarded regarding alcohol and cannabis use. The CSW will monitor for cord blood results.   CSW Plan/Description:  CSW Will Continue to Monitor Umbilical Cord Tissue Drug Screen Results and Make Report if Frutoso Schatz, LCSW 04/02/2018, 4:27 PM

## 2018-04-02 NOTE — Progress Notes (Signed)
Patient doing well today. Vitals have been stable and WDL. Bleeding small, fundus firm. Pain controlled with oral pain med. RN was reported that patient was "ugly" to nursery and BP staff. Patient has been appropriate today. Patient requesting to go outside and was outside this morning. When patient came back to room RN explained to her that she may not go outside due to being on contact precautions, for safety reasons. Patient verbalizes understanding of teaching.

## 2018-04-03 LAB — URINE CULTURE: Culture: >=100000 — AB

## 2018-04-04 LAB — HIV ANTIBODY (ROUTINE TESTING W REFLEX): HIV Screen 4th Generation wRfx: NONREACTIVE

## 2018-06-26 ENCOUNTER — Emergency Department
Admission: EM | Admit: 2018-06-26 | Discharge: 2018-06-26 | Disposition: A | Discharge status: Home / Self Care | Payer: Medicaid Other | Attending: Emergency Medicine | Admitting: Emergency Medicine

## 2018-06-26 ENCOUNTER — Encounter: Payer: Self-pay | Admitting: Emergency Medicine

## 2018-06-26 ENCOUNTER — Other Ambulatory Visit: Payer: Self-pay

## 2018-06-26 DIAGNOSIS — L700 Acne vulgaris: Secondary | ICD-10-CM | POA: Insufficient documentation

## 2018-06-26 DIAGNOSIS — F17210 Nicotine dependence, cigarettes, uncomplicated: Secondary | ICD-10-CM | POA: Insufficient documentation

## 2018-06-26 DIAGNOSIS — H6192 Disorder of left external ear, unspecified: Secondary | ICD-10-CM | POA: Diagnosis present

## 2018-06-26 MED ORDER — NAPROXEN 500 MG PO TABS: 500.0000 mg | ORAL_TABLET | Freq: Two times a day (BID) | ORAL | 0 refills | Status: DC

## 2018-06-26 MED ORDER — SULFAMETHOXAZOLE-TRIMETHOPRIM 800-160 MG PO TABS: 1.0000 | ORAL_TABLET | Freq: Two times a day (BID) | ORAL | 0 refills | Status: DC

## 2018-06-26 NOTE — ED Provider Notes (Signed)
[facility] Emergency Department Provider Note  ____________________________________________  Time seen: Approximately 2:07 PM  I have reviewed the triage vital signs and the nursing notes.   HISTORY  Chief Complaint Otalgia   HPI Victoria Villarreal is a 22 y.o. female who presents to the emergency department for treatment and evaluation of "bumps" in the left ear canal x 3 days. She was able to get one of them to "pop" yesterday, but today has another pustule and ear is very tender. Warm compress applied last night with some relief.    Past Medical History:  Diagnosis Date  . Anemia   . Depression   . Trichomoniasis     Patient Active Problem List   Diagnosis Date Noted  . Indication for care in labor or delivery 04/01/2018  . Labor and delivery, indication for care 03/08/2018  . Preterm uterine contractions in third trimester, antepartum 03/02/2018  . Nausea and vomiting during pregnancy 12/31/2017  . Nausea/vomiting in pregnancy 12/31/2017  . Pregnancy 03/05/2016  . No leakage of amniotic fluid into vagina 12/14/2015    Past Surgical History:  Procedure Laterality Date  . NO PAST SURGERIES      Prior to Admission medications   Medication Sig Start Date End Date Taking? Authorizing Provider  naproxen (NAPROSYN) 500 MG tablet Take 1 tablet (500 mg total) by mouth 2 (two) times daily with a meal. 06/26/18   Demani Weyrauch B, FNP  Prenatal Vit-Fe Fumarate-FA (PRENATAL MULTIVITAMIN) TABS tablet Take 1 tablet by mouth daily at 12 noon.    [provider]  sulfamethoxazole-trimethoprim (BACTRIM DS,SEPTRA DS) 800-160 MG tablet Take 1 tablet by mouth 2 (two) times daily. 06/26/18   Wilhelmenia Addis, Rulon Eisenmengerari B, FNP    Allergies Hydrocodone and Tramadol  No family history on file.  Social History Social History   Tobacco Use  . Smoking status: Current Every Day Smoker    Packs/day: 0.25    Types: Cigarettes  . Smokeless tobacco: Never Used  Substance  Use Topics  . Alcohol use: Yes    Alcohol/week: 1.0 standard drinks    Types: 1 Glasses of wine per week    Comment: intermittently  . Drug use: No    Review of Systems  Constitutional: Negative for fever. Respiratory: Negative for cough or shortness of breath.  Musculoskeletal: Negative for myalgias Skin: Positive for pustules in the left ear canal. Neurological: Negative for numbness or paresthesias. ____________________________________________   PHYSICAL EXAM:  VITAL SIGNS: ED Triage Vitals  Enc Vitals Group     BP 06/26/18 1401 135/87     Pulse Rate 06/26/18 1401 90     Resp 06/26/18 1401 14     Temp 06/26/18 1401 98.5 F (36.9 C)     Temp Source 06/26/18 1401 Oral     SpO2 06/26/18 1401 95 %     Weight 06/26/18 1359 181 lb (82.1 kg)     Height 06/26/18 1401 5\' 7"  (1.702 m)     Head Circumference --      Peak Flow --      Pain Score --      Pain Loc --      Pain Edu? --      Excl. in GC? --      Constitutional: Well appearing. Eyes: Conjunctivae are clear without discharge or drainage. Nose: No rhinorrhea noted. Mouth/Throat: Airway is patent.  Neck: No stridor. Unrestricted range of motion observed. Cardiovascular: Capillary refill is <3 seconds.  Respiratory: Respirations are even  and unlabored.. Musculoskeletal: Unrestricted range of motion observed. Neurologic: Awake, alert, and oriented x 4.  Skin:  2 pustules inside the left EAC.  ____________________________________________   LABS (all labs ordered are listed, but only abnormal results are displayed)  Labs Reviewed - No data to display ____________________________________________  EKG  Not indicated. ____________________________________________  RADIOLOGY  Not indicated ____________________________________________   PROCEDURES  Procedures ____________________________________________   INITIAL IMPRESSION / ASSESSMENT AND PLAN / ED COURSE  Victoria Villarreal is a 22 y.o. female who  presents to the ER for treatment and evaluation of painful pustules in the left ear. She will be treated with naprosyn and bactrim. She is to follow up with the primary care provider of her choice for symptoms that are not improving over the next 2 days.   Medications - No data to display   Pertinent labs & imaging results that were available during my care of the patient were reviewed by me and considered in my medical decision making (see chart for details).  ____________________________________________   FINAL CLINICAL IMPRESSION(S) / ED DIAGNOSES  Final diagnoses:  Acne comedone    ED Discharge Orders         Ordered    sulfamethoxazole-trimethoprim (BACTRIM DS,SEPTRA DS) 800-160 MG tablet  2 times daily     06/26/18 1422    naproxen (NAPROSYN) 500 MG tablet  2 times daily with meals     06/26/18 1422           Note:  This document was prepared using Dragon voice recognition software and may include unintentional dictation errors.    Chinita Pester, FNP 06/26/18 1524    Dionne Bucy, MD 06/26/18 1529

## 2018-06-26 NOTE — ED Triage Notes (Signed)
C/O left ear pain 

## 2018-11-18 ENCOUNTER — Encounter: Payer: Self-pay | Admitting: Emergency Medicine

## 2018-11-18 ENCOUNTER — Emergency Department: Payer: Medicaid Other

## 2018-11-18 ENCOUNTER — Emergency Department
Admission: EM | Admit: 2018-11-18 | Discharge: 2018-11-18 | Disposition: A | Discharge status: Home / Self Care | Payer: Medicaid Other | Attending: Emergency Medicine | Admitting: Emergency Medicine

## 2018-11-18 DIAGNOSIS — Z23 Encounter for immunization: Secondary | ICD-10-CM | POA: Diagnosis not present

## 2018-11-18 DIAGNOSIS — F17210 Nicotine dependence, cigarettes, uncomplicated: Secondary | ICD-10-CM | POA: Insufficient documentation

## 2018-11-18 DIAGNOSIS — S90121A Contusion of right lesser toe(s) without damage to nail, initial encounter: Secondary | ICD-10-CM

## 2018-11-18 DIAGNOSIS — H6503 Acute serous otitis media, bilateral: Secondary | ICD-10-CM | POA: Diagnosis not present

## 2018-11-18 DIAGNOSIS — Y999 Unspecified external cause status: Secondary | ICD-10-CM | POA: Insufficient documentation

## 2018-11-18 DIAGNOSIS — W228XXA Striking against or struck by other objects, initial encounter: Secondary | ICD-10-CM | POA: Insufficient documentation

## 2018-11-18 DIAGNOSIS — T148XXA Other injury of unspecified body region, initial encounter: Secondary | ICD-10-CM

## 2018-11-18 DIAGNOSIS — S99921A Unspecified injury of right foot, initial encounter: Secondary | ICD-10-CM | POA: Diagnosis present

## 2018-11-18 DIAGNOSIS — Y939 Activity, unspecified: Secondary | ICD-10-CM | POA: Insufficient documentation

## 2018-11-18 DIAGNOSIS — H6500 Acute serous otitis media, unspecified ear: Secondary | ICD-10-CM

## 2018-11-18 DIAGNOSIS — Y929 Unspecified place or not applicable: Secondary | ICD-10-CM | POA: Insufficient documentation

## 2018-11-18 LAB — POCT PREGNANCY, URINE: PREG TEST UR: NEGATIVE

## 2018-11-18 MED ORDER — TETANUS-DIPHTH-ACELL PERTUSSIS 5-2.5-18.5 LF-MCG/0.5 IM SUSP
0.5000 mL | Freq: Once | INTRAMUSCULAR | Status: AC
  Administered 2018-11-18: 0.5 mL via INTRAMUSCULAR
  Filled 2018-11-18: qty 0.5

## 2018-11-18 MED ORDER — NEOMYCIN-POLYMYXIN-HC 3.5-10000-1 OT SOLN: 3.0000 [drp] | Freq: Three times a day (TID) | OTIC | 0 refills | Status: AC

## 2018-11-18 MED ORDER — AMOXICILLIN 500 MG PO CAPS: 500.0000 mg | ORAL_CAPSULE | Freq: Three times a day (TID) | ORAL | 0 refills | Status: DC

## 2018-11-18 NOTE — ED Provider Notes (Signed)
[facility] Emergency Department Provider Note  ____________________________________________   First MD Initiated Contact with Patient 11/18/18 1653     (approximate)  I have reviewed the triage vital signs and the nursing notes.   HISTORY  Chief Complaint Otalgia and Foot Pain    HPI Victoria Villarreal is a 23 y.o. female presents emergency department complaining of right foot pain that she shot in her front door 2 days ago.  She has an abrasion she states gets more swollen at night.  She is also concerned that she missed her period 2 days ago.  She is also concerned about her ear pain.  She states her ears are hurting so bad she cannot decide which side to lay on.  She denies any fever chills.  She denies chest pain or shortness of breath.  She denies abdominal pain.    Past Medical History:  Diagnosis Date  . Anemia   . Depression   . Trichomoniasis     Patient Active Problem List   Diagnosis Date Noted  . Indication for care in labor or delivery 04/01/2018  . Labor and delivery, indication for care 03/08/2018  . Preterm uterine contractions in third trimester, antepartum 03/02/2018  . Nausea and vomiting during pregnancy 12/31/2017  . Nausea/vomiting in pregnancy 12/31/2017  . Pregnancy 03/05/2016  . No leakage of amniotic fluid into vagina 12/14/2015    Past Surgical History:  Procedure Laterality Date  . NO PAST SURGERIES      Prior to Admission medications   Medication Sig Start Date End Date Taking? Authorizing Provider  amoxicillin (AMOXIL) 500 MG capsule Take 1 capsule (500 mg total) by mouth 3 (three) times daily. 11/18/18   Ziara Thelander, Roselyn Bering, PA-C  neomycin-polymyxin-hydrocortisone (CORTISPORIN) OTIC solution Place 3 drops into the left ear 3 (three) times daily for 10 days. 11/18/18 11/28/18  Sherrie Mustache Roselyn Bering, PA-C    Allergies Hydrocodone and Tramadol  No family history on file.  Social History Social History   Tobacco Use  .  Smoking status: Current Every Day Smoker    Packs/day: 0.25    Types: Cigarettes  . Smokeless tobacco: Never Used  Substance Use Topics  . Alcohol use: Yes    Alcohol/week: 1.0 standard drinks    Types: 1 Glasses of wine per week    Comment: intermittently  . Drug use: No    Review of Systems  Constitutional: No fever/chills Eyes: No visual changes. ENT: No sore throat.  Positive bilateral ear pain Respiratory: Denies cough Genitourinary: Negative for dysuria. Musculoskeletal: Negative for back pain.  Positive right foot pain Skin: Negative for rash.    ____________________________________________   PHYSICAL EXAM:  VITAL SIGNS: ED Triage Vitals  Enc Vitals Group     BP 11/18/18 1606 131/84     Pulse Rate 11/18/18 1606 84     Resp 11/18/18 1606 20     Temp 11/18/18 1606 98.3 F (36.8 C)     Temp Source 11/18/18 1606 Oral     SpO2 11/18/18 1606 98 %     Weight 11/18/18 1607 179 lb (81.2 kg)     Height 11/18/18 1607 5\' 7"  (1.702 m)     Head Circumference --      Peak Flow --      Pain Score 11/18/18 1607 10     Pain Loc --      Pain Edu? --      Excl. in GC? --     Constitutional:  Alert and oriented. Well appearing and in no acute distress. Eyes: Conjunctivae are normal.  Head: Atraumatic. Ears: TMs are pink bilaterally, dull Nose: No congestion/rhinnorhea. Mouth/Throat: Mucous membranes are moist.  Throat appears normal Neck:  supple no lymphadenopathy noted Cardiovascular: Normal rate, regular rhythm. Heart sounds are normal Respiratory: Normal respiratory effort.  No retractions, lungs c t a  GU: deferred Musculoskeletal: FROM all extremities, warm and well perfused, the right foot is tender along the medial aspect, and a abrasion is noted across the distal metatarsals of the first and second toe Neurologic:  Normal speech and language.  Skin:  Skin is warm, dry , positive abrasion no rash noted. Psychiatric: Mood and affect are normal. Speech and  behavior are normal.  ____________________________________________   LABS (all labs ordered are listed, but only abnormal results are displayed)  Labs Reviewed  POCT PREGNANCY, URINE   ____________________________________________   ____________________________________________  RADIOLOGY  X-ray the right foot is negative  ____________________________________________   PROCEDURES  Procedure(s) performed: Tdap, Ace wrap, wooden shoe  Procedures    ____________________________________________   INITIAL IMPRESSION / ASSESSMENT AND PLAN / ED COURSE  Pertinent labs & imaging results that were available during my care of the patient were reviewed by me and considered in my medical decision making (see chart for details).   Patient is 23 year old female presents emergency department complaint of right foot pain ear pain and questionable pregnancy  Physical exam shows TMs are pink and dull, right foot is tender, abrasion noted on right foot.  Remainder exam is unremarkable  X-ray of the right foot is negative POC pregnancy is negative  Explained to the patient all of the test results.  She was placed in a Ace wrap and postop shoe for comfort.  She was given a Tdap while here in the ED.  She is taking over-the-counter Tylenol/ibuprofen for pain as needed.  She was given a prescription for amoxicillin and Cortisporin otic drops.  She is to follow-up with regular doctor if not better in 3 days.  Return if worsening.  She states she understands was discharged stable condition.     As part of my medical decision making, I reviewed the following data within the electronic MEDICAL RECORD NUMBER Nursing notes reviewed and incorporated, Labs reviewed POC pregnant negative, Old chart reviewed, Radiograph reviewed x-ray of the right foot is negative, Notes from prior ED visits and [place] Controlled Substance Database  ____________________________________________   FINAL CLINICAL IMPRESSION(S)  / ED DIAGNOSES  Final diagnoses:  Acute serous otitis media, recurrence not specified, unspecified laterality  Contusion of fifth toe of right foot, initial encounter  Abrasion      NEW MEDICATIONS STARTED DURING THIS VISIT:  Discharge Medication List as of 11/18/2018  5:41 PM    START taking these medications   Details  amoxicillin (AMOXIL) 500 MG capsule Take 1 capsule (500 mg total) by mouth 3 (three) times daily., Starting Fri 11/18/2018, Normal    neomycin-polymyxin-hydrocortisone (CORTISPORIN) OTIC solution Place 3 drops into the left ear 3 (three) times daily for 10 days., Starting Fri 11/18/2018, Until Mon 11/28/2018, Normal         Note:  This document was prepared using Dragon voice recognition software and may include unintentional dictation errors.    Faythe Ghee, PA-C 11/18/18 2145    Schaevitz, Myra Rude, MD 11/18/18 [phone]

## 2018-11-18 NOTE — ED Notes (Signed)
See triage note   Presents with pain to right foot  States she shut her foot in door 2 days ago  Also having pain to both ears   States pain started in ears about 2 weeks ago  Also has been having some intermittent fever

## 2018-11-18 NOTE — Discharge Instructions (Addendum)
Follow-up with your regular doctor if not better in 3 to 4 days.  Return emergency department worsening.  Use medications as prescribed. °

## 2018-11-18 NOTE — ED Triage Notes (Signed)
Pt reports earaches in both ears intermittently for the past 2 weeks. Denies fevers. Pt also reports she shut her right foot in a door a couple of days ago and now it is painful and has an abrasion.

## 2019-02-03 ENCOUNTER — Encounter: Payer: Self-pay | Admitting: Emergency Medicine

## 2019-02-03 ENCOUNTER — Other Ambulatory Visit: Payer: Self-pay

## 2019-02-03 ENCOUNTER — Emergency Department
Admission: EM | Admit: 2019-02-03 | Discharge: 2019-02-03 | Disposition: A | Discharge status: Home / Self Care | Payer: Medicaid Other | Attending: Emergency Medicine | Admitting: Emergency Medicine

## 2019-02-03 DIAGNOSIS — F17210 Nicotine dependence, cigarettes, uncomplicated: Secondary | ICD-10-CM | POA: Diagnosis not present

## 2019-02-03 DIAGNOSIS — R519 Headache, unspecified: Secondary | ICD-10-CM

## 2019-02-03 DIAGNOSIS — R51 Headache: Secondary | ICD-10-CM | POA: Insufficient documentation

## 2019-02-03 DIAGNOSIS — J019 Acute sinusitis, unspecified: Secondary | ICD-10-CM

## 2019-02-03 LAB — POCT PREGNANCY, URINE: Preg Test, Ur: NEGATIVE

## 2019-02-03 MED ORDER — BUTALBITAL-APAP-CAFFEINE 50-325-40 MG PO TABS
1.0000 | ORAL_TABLET | Freq: Once | ORAL | Status: AC
  Administered 2019-02-03: 1 via ORAL
  Filled 2019-02-03: qty 1

## 2019-02-03 MED ORDER — OXYMETAZOLINE HCL 0.05 % NA SOLN: 2.0000 | Freq: Two times a day (BID) | NASAL | 0 refills | Status: AC

## 2019-02-03 MED ORDER — BUTALBITAL-APAP-CAFFEINE 50-325-40 MG PO TABS: 1.0000 | ORAL_TABLET | Freq: Four times a day (QID) | ORAL | 0 refills | Status: AC | PRN

## 2019-02-03 NOTE — ED Triage Notes (Signed)
Patient ambulatory to triage with steady gait, without difficulty, tearful; pt reports HA to temple x 3-4 days

## 2019-02-03 NOTE — ED Notes (Signed)
Pt holding her head and crying in pain, pt states "I think I have a sinus infection"

## 2019-02-03 NOTE — ED Provider Notes (Signed)
[facility] Emergency Department Provider Note    ____________________________________________   I have reviewed the triage vital signs and the nursing notes.   HISTORY  Chief Complaint Sinus infection  History limited by: Not Limited   HPI Victoria Villarreal is a 23 y.o. female who presents to the emergency department today with chief complaint of concern for sinus infection. The patient states that for the past four days she has been having pain to her temples and her nose. She says the pain started behind her right eye but then spread. She denies any change in vision. Has had some rhinorrhea and feels like her nose is clogged up. Has tried OTC pain medication without any relief and has tried a nasal spray without any relief. Says that she has had sinus infections in the past. Denies any fevers. No nausea, vomiting, diarrhea, chest pain, shortness of breath or rash.   Records reviewed. Per medical record review patient has a history of emergency department visits in the past with diagnosis of pansinusitis, otitis media.   Past Medical History:  Diagnosis Date  . Anemia   . Depression   . Trichomoniasis     Patient Active Problem List   Diagnosis Date Noted  . Indication for care in labor or delivery 04/01/2018  . Labor and delivery, indication for care 03/08/2018  . Preterm uterine contractions in third trimester, antepartum 03/02/2018  . Nausea and vomiting during pregnancy 12/31/2017  . Nausea/vomiting in pregnancy 12/31/2017  . Pregnancy 03/05/2016  . No leakage of amniotic fluid into vagina 12/14/2015    Past Surgical History:  Procedure Laterality Date  . NO PAST SURGERIES      Prior to Admission medications   Medication Sig Start Date End Date Taking? Authorizing Provider  amoxicillin (AMOXIL) 500 MG capsule Take 1 capsule (500 mg total) by mouth 3 (three) times daily. 11/18/18   Faythe GheeFisher, Susan W, PA-C    Allergies Hydrocodone and  Tramadol  No family history on file.  Social History Social History   Tobacco Use  . Smoking status: Current Every Day Smoker    Packs/day: 0.25    Types: Cigarettes  . Smokeless tobacco: Never Used  Substance Use Topics  . Alcohol use: Yes    Alcohol/week: 1.0 standard drinks    Types: 1 Glasses of wine per week    Comment: intermittently  . Drug use: No    Review of Systems Constitutional: No fever/chills Eyes: No visual changes. Positive for bilateral eye pain.  ENT: Positive for nasal pain, stuffiness, rhinorrhea.  Cardiovascular: Denies chest pain. Respiratory: Denies shortness of breath. Gastrointestinal: No abdominal pain.  No nausea, no vomiting.  No diarrhea.   Genitourinary: Negative for dysuria. Musculoskeletal: Negative for back pain. Skin: Negative for rash. Neurological: Negative for headaches, focal weakness or numbness.  ____________________________________________   PHYSICAL EXAM:  VITAL SIGNS: ED Triage Vitals  Enc Vitals Group     BP 02/03/19 0632 (!) 143/100     Pulse Rate 02/03/19 0632 96     Resp 02/03/19 0632 (!) 24     Temp 02/03/19 0632 98 F (36.7 C)     Temp Source 02/03/19 0632 Oral     SpO2 02/03/19 0632 99 %     Weight 02/03/19 0624 179 lb (81.2 kg)     Height 02/03/19 0624 5\' 7"  (1.702 m)     Head Circumference --      Peak Flow --      Pain Score  02/03/19 0626 10   Constitutional: Alert and oriented.  Eyes: Conjunctivae are slightly injected bilaterally.  ENT      Head: Normocephalic and atraumatic.      Nose: No congestion/rhinnorhea.      Mouth/Throat: Mucous membranes are moist.      Ears: Left TM and external auditory canal wnl. Right TM with slight erythema but no bulging or fluid. Auditory canal wnl.       Neck: No stridor. Hematological/Lymphatic/Immunilogical: No cervical lymphadenopathy. Cardiovascular: Normal rate, regular rhythm.  No murmurs, rubs, or gallops.  Respiratory: Normal respiratory effort without  tachypnea nor retractions. Breath sounds are clear and equal bilaterally. No wheezes/rales/rhonchi. Gastrointestinal: Soft and non tender. No rebound. No guarding.  Genitourinary: Deferred Musculoskeletal: Normal range of motion in all extremities. No lower extremity edema. Neurologic:  Normal speech and language. No gross focal neurologic deficits are appreciated.  Skin:  Skin is warm, dry and intact. No rash noted. Psychiatric: Mood and affect are normal. Speech and behavior are normal. Patient exhibits appropriate insight and judgment.  ____________________________________________    LABS (pertinent positives/negatives)  Upreg negative  ____________________________________________   EKG  None  ____________________________________________    RADIOLOGY  None  ____________________________________________   PROCEDURES  Procedures  ____________________________________________   INITIAL IMPRESSION / ASSESSMENT AND PLAN / ED COURSE  Pertinent labs & imaging results that were available during my care of the patient were reviewed by me and considered in my medical decision making (see chart for details).   Patient presented because of concern for sinus type symptoms. At this point given lack of focal neuro deficit or complaint I doubt significant intracranial pathology. Also given lack of vision change doubt any glaucoma or significant ocular pathology. Do think sinusitis likely. Think likely viral at this time given short length of duration. Patient did feel improvement with medication. Will give prescription for Fioricet and afrin. Discussed plan with patient.   ____________________________________________   FINAL CLINICAL IMPRESSION(S) / ED DIAGNOSES  Final diagnoses:  Nonintractable headache, unspecified chronicity pattern, unspecified headache type  Acute sinusitis, recurrence not specified, unspecified location     Note: This dictation was prepared with Dragon  dictation. Any transcriptional errors that result from this process are unintentional     Phineas Semen, MD 02/03/19 [phone]

## 2019-02-03 NOTE — Discharge Instructions (Addendum)
Please seek medical attention for any high fevers, chest pain, shortness of breath, change in behavior, persistent vomiting, bloody stool or any other new or concerning symptoms.  

## 2019-04-14 ENCOUNTER — Encounter: Payer: Self-pay | Admitting: Emergency Medicine

## 2019-04-14 ENCOUNTER — Other Ambulatory Visit: Payer: Self-pay

## 2019-04-14 ENCOUNTER — Emergency Department: Payer: Medicaid Other

## 2019-04-14 ENCOUNTER — Emergency Department
Admission: EM | Admit: 2019-04-14 | Discharge: 2019-04-14 | Disposition: A | Discharge status: Home / Self Care | Payer: Medicaid Other | Attending: Emergency Medicine | Admitting: Emergency Medicine

## 2019-04-14 DIAGNOSIS — F17210 Nicotine dependence, cigarettes, uncomplicated: Secondary | ICD-10-CM | POA: Diagnosis not present

## 2019-04-14 DIAGNOSIS — Y999 Unspecified external cause status: Secondary | ICD-10-CM | POA: Diagnosis not present

## 2019-04-14 DIAGNOSIS — Y939 Activity, unspecified: Secondary | ICD-10-CM | POA: Insufficient documentation

## 2019-04-14 DIAGNOSIS — S61531A Puncture wound without foreign body of right wrist, initial encounter: Secondary | ICD-10-CM | POA: Diagnosis not present

## 2019-04-14 DIAGNOSIS — S61432A Puncture wound without foreign body of left hand, initial encounter: Secondary | ICD-10-CM | POA: Insufficient documentation

## 2019-04-14 DIAGNOSIS — Y929 Unspecified place or not applicable: Secondary | ICD-10-CM | POA: Diagnosis not present

## 2019-04-14 DIAGNOSIS — S6992XA Unspecified injury of left wrist, hand and finger(s), initial encounter: Secondary | ICD-10-CM | POA: Diagnosis present

## 2019-04-14 DIAGNOSIS — T07XXXA Unspecified multiple injuries, initial encounter: Secondary | ICD-10-CM

## 2019-04-14 MED ORDER — CEPHALEXIN 500 MG PO CAPS: 500.0000 mg | ORAL_CAPSULE | Freq: Three times a day (TID) | ORAL | 0 refills | Status: AC

## 2019-04-14 MED ORDER — MELOXICAM 15 MG PO TABS: 15.0000 mg | ORAL_TABLET | Freq: Every day | ORAL | 1 refills | Status: AC
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
## 2020-05-10 ENCOUNTER — Other Ambulatory Visit: Payer: Self-pay

## 2020-05-10 ENCOUNTER — Emergency Department
Admission: EM | Admit: 2020-05-10 | Discharge: 2020-05-10 | Disposition: A | Discharge status: Home / Self Care | Payer: Medicaid Other | Attending: Emergency Medicine | Admitting: Emergency Medicine

## 2020-05-10 ENCOUNTER — Encounter: Payer: Self-pay | Admitting: Emergency Medicine

## 2020-05-10 DIAGNOSIS — L089 Local infection of the skin and subcutaneous tissue, unspecified: Secondary | ICD-10-CM | POA: Diagnosis not present

## 2020-05-10 DIAGNOSIS — K130 Diseases of lips: Secondary | ICD-10-CM | POA: Insufficient documentation

## 2020-05-10 MED ORDER — NAPROXEN 500 MG PO TABS: 500.0000 mg | ORAL_TABLET | Freq: Two times a day (BID) | ORAL | 0 refills | Status: DC

## 2020-05-10 MED ORDER — OXYCODONE-ACETAMINOPHEN 7.5-325 MG PO TABS: 1.0000 | ORAL_TABLET | Freq: Four times a day (QID) | ORAL | 0 refills | Status: DC | PRN

## 2020-05-10 MED ORDER — DIPHENHYDRAMINE HCL 25 MG PO TABS: 25.0000 mg | ORAL_TABLET | Freq: Four times a day (QID) | ORAL | 0 refills | Status: DC | PRN

## 2020-05-10 MED ORDER — SULFAMETHOXAZOLE-TRIMETHOPRIM 800-160 MG PO TABS: 1.0000 | ORAL_TABLET | Freq: Two times a day (BID) | ORAL | 0 refills | Status: DC

## 2020-05-10 NOTE — ED Provider Notes (Signed)
[facility] Emergency Department Provider Note   ____________________________________________   First MD Initiated Contact with Patient 05/10/20 1152     (approximate)  I have reviewed the triage vital signs and the nursing notes.   HISTORY  Chief Complaint Foreign Body    HPI Victoria Villarreal is a 24 y.o. female patient presents with edema most upper lip secondary to piercing.  Patient appears and has been in place for over a year but in the last 2 days she has no swelling at the site with redness.  No drainage.  Patient states edema is so advanced that she cannot visualized the piercing.  Rates pain as a 10/10.  Described pain is "achy".  No palliative measure for complaint.       Past Medical History:  Diagnosis Date  . Anemia   . Depression   . Trichomoniasis     Patient Active Problem List   Diagnosis Date Noted  . Indication for care in labor or delivery 04/01/2018  . Labor and delivery, indication for care 03/08/2018  . Preterm uterine contractions in third trimester, antepartum 03/02/2018  . Nausea and vomiting during pregnancy 12/31/2017  . Nausea/vomiting in pregnancy 12/31/2017  . Pregnancy 03/05/2016  . No leakage of amniotic fluid into vagina 12/14/2015    Past Surgical History:  Procedure Laterality Date  . NO PAST SURGERIES      Prior to Admission medications   Medication Sig Start Date End Date Taking? Authorizing Provider  diphenhydrAMINE (BENADRYL ALLERGY) 25 MG tablet Take 1 tablet (25 mg total) by mouth every 6 (six) hours as needed. 05/10/20   Joni Reining, PA-C  naproxen (NAPROSYN) 500 MG tablet Take 1 tablet (500 mg total) by mouth 2 (two) times daily with a meal. 05/10/20   Joni Reining, PA-C  oxyCODONE-acetaminophen (PERCOCET) 7.5-325 MG tablet Take 1 tablet by mouth every 6 (six) hours as needed. 05/10/20   Joni Reining, PA-C  sulfamethoxazole-trimethoprim (BACTRIM DS) 800-160 MG tablet Take 1 tablet by mouth  2 (two) times daily. 05/10/20   Joni Reining, PA-C    Allergies Hydrocodone and Tramadol  History reviewed. No pertinent family history.  Social History Social History   Tobacco Use  . Smoking status: Current Every Day Smoker    Packs/day: 0.25    Types: Cigarettes  . Smokeless tobacco: Never Used  Vaping Use  . Vaping Use: Never used  Substance Use Topics  . Alcohol use: Yes    Alcohol/week: 1.0 standard drink    Types: 1 Glasses of wine per week    Comment: intermittently  . Drug use: No    Review of Systems  Constitutional: No fever/chills Eyes: No visual changes. ENT: No sore throat. Cardiovascular: Denies chest pain. Respiratory: Denies shortness of breath. Gastrointestinal: No abdominal pain.  No nausea, no vomiting.  No diarrhea.  No constipation. Genitourinary: Negative for dysuria. Musculoskeletal: Negative for back pain. Skin: Negative for rash. Neurological: Negative for headaches, focal weakness or numbness. Psychiatric: Depression  Allergic/Immunilogical: Tramadol hydrocodone. ____________________________________________   PHYSICAL EXAM:  VITAL SIGNS: ED Triage Vitals  Enc Vitals Group     BP 05/10/20 0946 (!) 130/83     Pulse Rate 05/10/20 0946 69     Resp 05/10/20 0946 18     Temp 05/10/20 0946 98.3 F (36.8 C)     Temp Source 05/10/20 0946 Oral     SpO2 05/10/20 0946 96 %     Weight 05/10/20 0940 Marland Kitchen)  211 lb (95.7 kg)     Height 05/10/20 0940 5\' 7"  (1.702 m)     Head Circumference --      Peak Flow --      Pain Score 05/10/20 0939 10     Pain Loc --      Pain Edu? --      Excl. in GC? --    Constitutional: Alert and oriented. Well appearing and in no acute distress. Mouth/Throat: Mucous membranes are moist.  Oropharynx non-erythematous. Neck: No stridor. Hematological/Lymphatic/Immunilogical: No cervical lymphadenopathy. Cardiovascular: Normal rate, regular rhythm. Grossly normal heart sounds.  Good peripheral  circulation. Respiratory: Normal respiratory effort.  No retractions. Lungs CTAB. Skin:  Skin is warm, dry and intact. No rash noted.  Edema  upper or lower lip Psychiatric: Mood and affect are normal. Speech and behavior are normal.  ____________________________________________   LABS (all labs ordered are listed, but only abnormal results are displayed)  Labs Reviewed - No data to display ____________________________________________  EKG   ____________________________________________  RADIOLOGY  ED MD interpretation:    Official radiology report(s): No results found.  ____________________________________________   PROCEDURES  Procedure(s) performed (including Critical Care):  Procedures   ____________________________________________   INITIAL IMPRESSION / ASSESSMENT AND PLAN / ED COURSE  As part of my medical decision making, I reviewed the following data within the electronic MEDICAL RECORD NUMBER     Patient presents with edema and erythema to the upper lip secondary to piercing.  Unable to observe foreign body secondary to edema.  Patient given discharge care instruction advised take medication as directed.  Patient advised to follow-up in 5 days for reevaluation.  Advised patient to be in her best interest to consider having the piercing object removed.    Victoria Villarreal was evaluated in Emergency Department on 05/10/2020 for the symptoms described in the history of present illness. She was evaluated in the context of the global COVID-19 pandemic, which necessitated consideration that the patient might be at risk for infection with the SARS-CoV-2 virus that causes COVID-19. Institutional protocols and algorithms that pertain to the evaluation of patients at risk for COVID-19 are in a state of rapid change based on information released by regulatory bodies including the CDC and federal and state organizations. These policies and algorithms were followed during the  patient's care in the ED.       ____________________________________________   FINAL CLINICAL IMPRESSION(S) / ED DIAGNOSES  Final diagnoses:  Pierced lip infection     ED Discharge Orders         Ordered    oxyCODONE-acetaminophen (PERCOCET) 7.5-325 MG tablet  Every 6 hours PRN     Discontinue  Reprint     05/10/20 1214    sulfamethoxazole-trimethoprim (BACTRIM DS) 800-160 MG tablet  2 times daily     Discontinue  Reprint     05/10/20 1214    naproxen (NAPROSYN) 500 MG tablet  2 times daily with meals     Discontinue  Reprint     05/10/20 1214    diphenhydrAMINE (BENADRYL ALLERGY) 25 MG tablet  Every 6 hours PRN     Discontinue  Reprint     05/10/20 1214           Note:  This document was prepared using Dragon voice recognition software and may include unintentional dictation errors.    05/12/20, PA-C 05/10/20 1844    05/12/20, MD 05/11/20 [phone]

## 2020-05-10 NOTE — ED Triage Notes (Signed)
pts lip has swollen around lip ring and pt now thinks it is infected. Having pain to upper lip.  Swelling noted, unable to visualize piercing.  No fever.  Ambulatory.

## 2020-05-10 NOTE — Discharge Instructions (Signed)
Take medication as directed and make an appointment with [facility]  for removal.

## 2020-05-10 NOTE — ED Notes (Signed)
See triage note  Thinks she may have an infection to her lip around a piercing  No fever

## 2020-09-22 IMAGING — DX DG FOOT COMPLETE 3+V*R*
3 series · 3 of 3 positions shown · non-contrast
Comparison: None.

CLINICAL DATA: Patient with right foot pain.  Initial encounter.

EXAM:
RIGHT FOOT COMPLETE - 3+ VIEW

[foot ap]
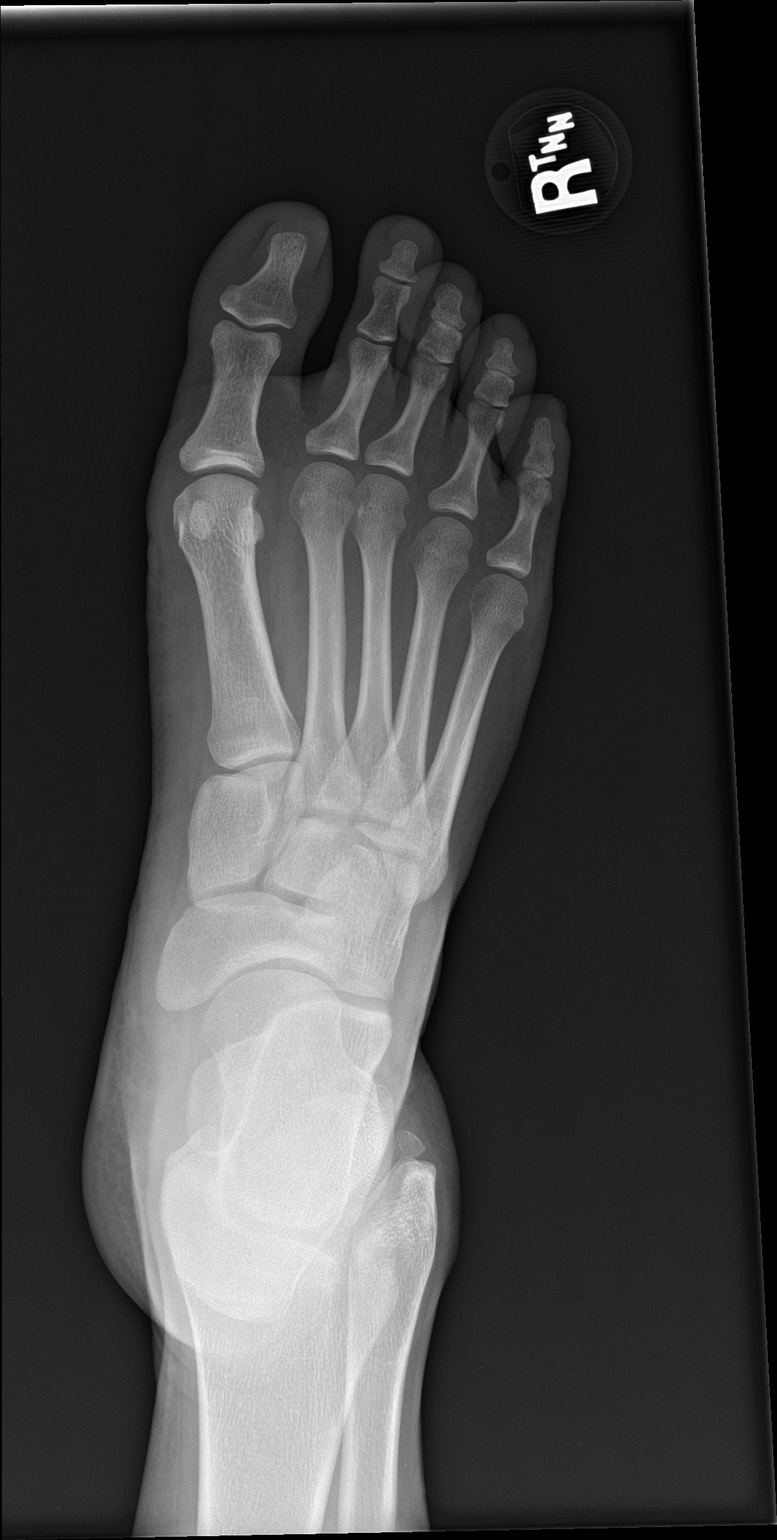

[foot obl]
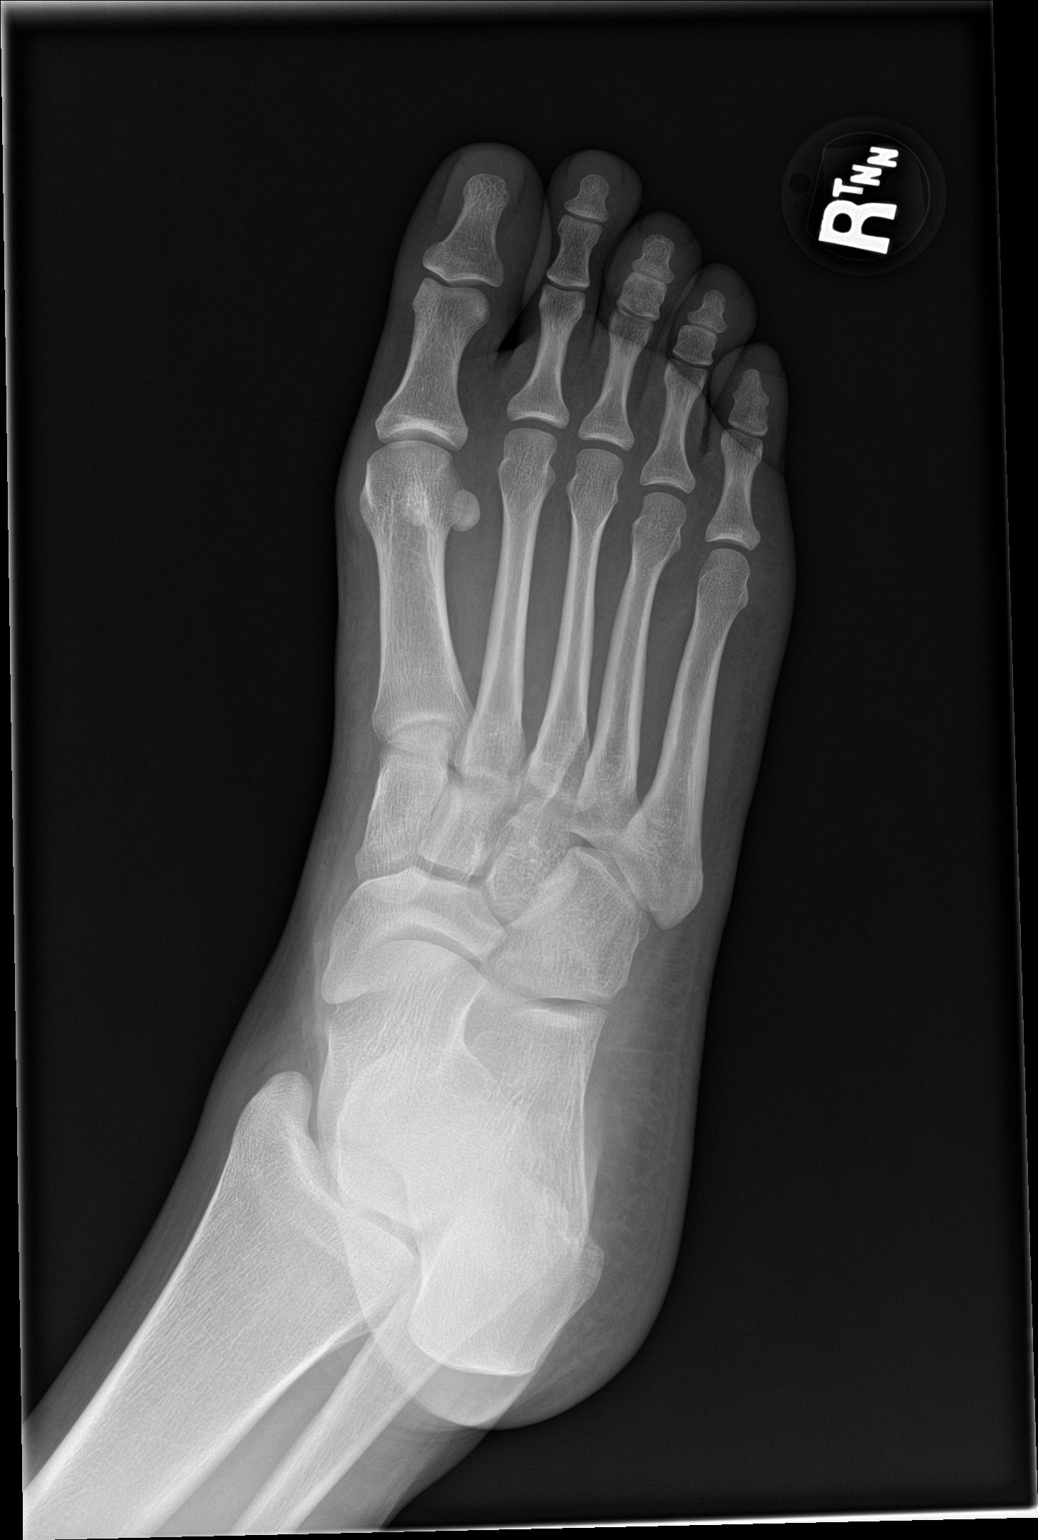

[foot lat]
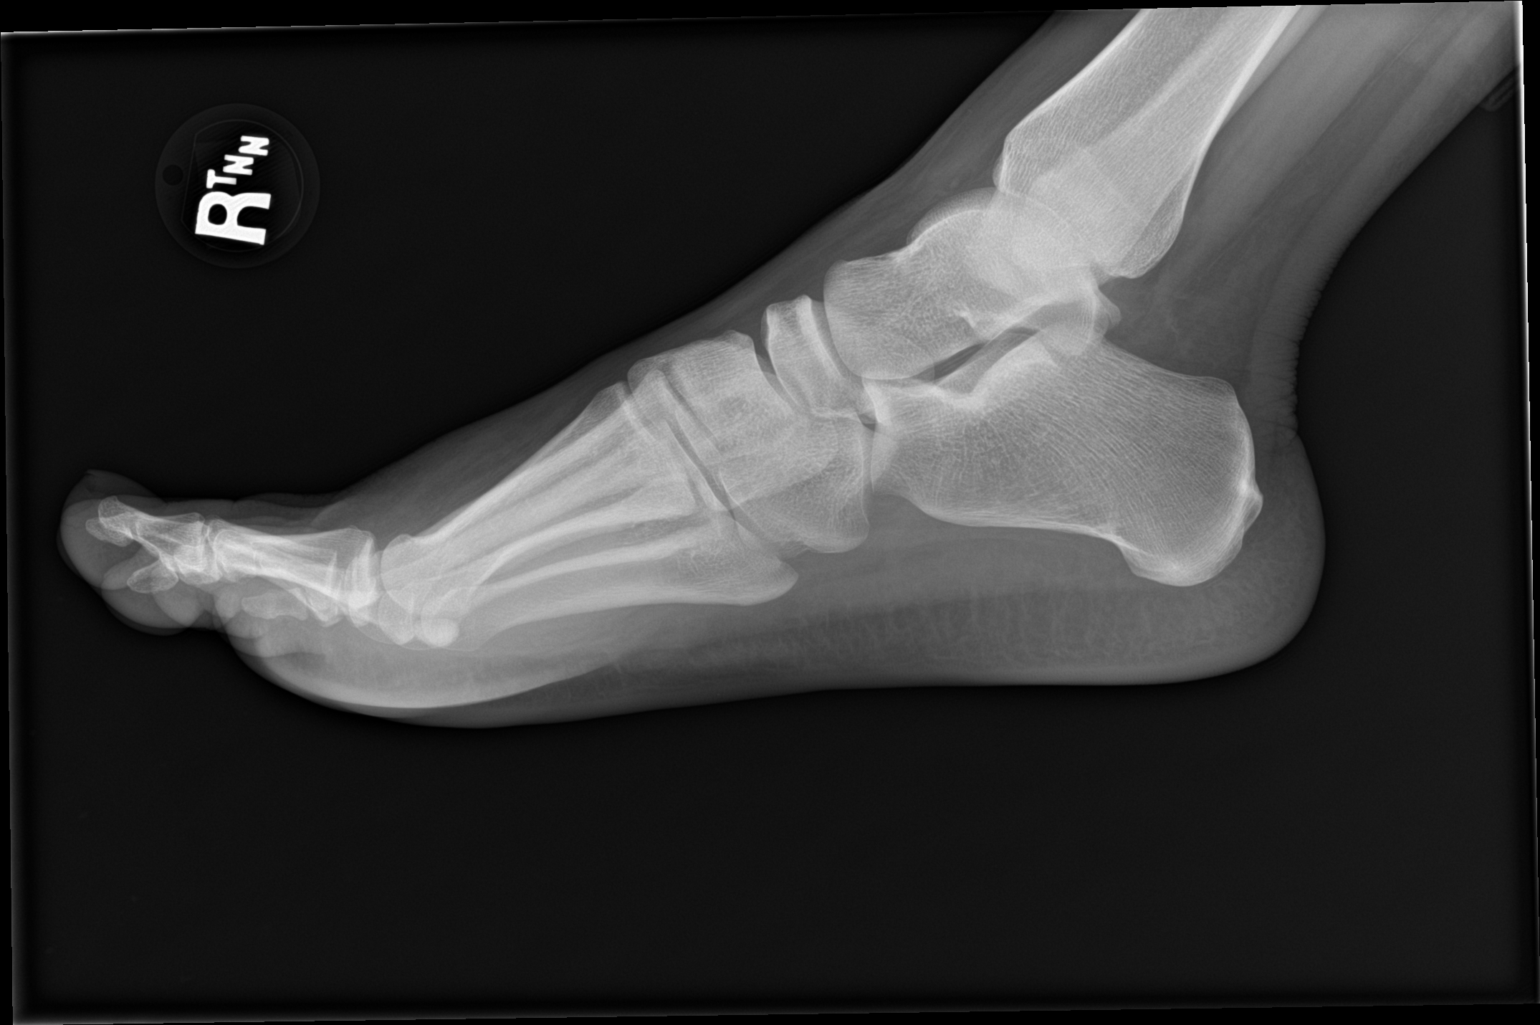

[3 of 3 positions shown; findings below may reference images not displayed]

FINDINGS: Well corticated ossific density about the distal fibula. Normal
anatomic alignment. No evidence for acute fracture or dislocation.
Regional soft tissues unremarkable.
IMPRESSION: Well corticated ossific density about the distal aspect of the
fibula, incompletely visualized. Consider dedicated ankle radiograph
if there is point tenderness at this location.

Otherwise unremarkable exam.

## 2021-10-12 NOTE — L&D Delivery Note (Addendum)
Delivery Note  Victoria Villarreal is a [id number] at [redacted]w[redacted]d with an LMP of 08/04/21, inconsistent with [place] at [redacted]w[redacted]d.   First Stage: Labor onset: 2000 Analgesia /Anesthesia intrapartum: epidural SROM at 0143- clear GBS: positive IP Antibiotics: penicillin  Second Stage: Complete dilation at 0145 Onset of pushing at 0145 FHR second stage 125 bpm with moderate, variable decels with pushing   Victoria Villarreal presented to L&D with painful uterine contractions . She was 7/80/-1. She progressed  to C/C/+2 with a spontaneous urge to push.  She pushed  effectively over approximately 3 minutes for a spontaneous vaginal birth.  Delivery of a viable baby boy on 04/23/2022  at 0148 by CNM Delivery of fetal head in OA position with restitution to LOT. no nuchal cord;  Anterior then posterior shoulders delivered easily with gentle downward traction. Baby placed on mom's chest, and attended to by baby RN Cord double clamped after cessation of pulsation, cut by FOB  Cord blood sample collection: Yes O POS Collection of cord blood donation yes Arterial cord blood sample no  Third Stage: Oxytocin bolus started after delivery of infant for hemorrhage prophylaxis  Placenta delivered [name] intact with 3 VC @ 0159 Placenta disposition: discarded per protocol Uterine tone firm / bleeding small  no laceration identified  Anesthesia for repair: none Repair N/A Est. Blood Loss (mL): 150  Complications: none  Mom to postpartum.  Baby to Couplet care / Skin to Skin.  Newborn: Information for the patient's newborn:  Victoria Villarreal, Victoria Villarreal [[id number]]  Live born female [name] Birth Weight:   APGAR: ,   Newborn Delivery   Birth date/time: 04/23/2022 01:48:00 Delivery type: Vaginal, Spontaneous        Feeding planned: Pumping  ---------- Chari Manning, CNM Certified Nurse Midwife [facility]

## 2021-11-10 DIAGNOSIS — O0993 Supervision of high risk pregnancy, unspecified, third trimester: Secondary | ICD-10-CM | POA: Insufficient documentation

## 2021-11-18 DIAGNOSIS — O99333 Smoking (tobacco) complicating pregnancy, third trimester: Secondary | ICD-10-CM | POA: Insufficient documentation

## 2021-11-18 DIAGNOSIS — O09892 Supervision of other high risk pregnancies, second trimester: Secondary | ICD-10-CM | POA: Insufficient documentation

## 2022-04-03 ENCOUNTER — Other Ambulatory Visit: Payer: Self-pay

## 2022-04-03 ENCOUNTER — Encounter: Payer: Self-pay | Admitting: Obstetrics and Gynecology

## 2022-04-03 ENCOUNTER — Observation Stay
Admission: EM | Admit: 2022-04-03 | Discharge: 2022-04-03 | Disposition: A | Discharge status: Home / Self Care | Payer: Medicaid Other | Attending: Certified Nurse Midwife | Admitting: Certified Nurse Midwife

## 2022-04-03 DIAGNOSIS — R030 Elevated blood-pressure reading, without diagnosis of hypertension: Secondary | ICD-10-CM | POA: Diagnosis not present

## 2022-04-03 DIAGNOSIS — O26893 Other specified pregnancy related conditions, third trimester: Secondary | ICD-10-CM | POA: Insufficient documentation

## 2022-04-03 DIAGNOSIS — O98313 Other infections with a predominantly sexual mode of transmission complicating pregnancy, third trimester: Secondary | ICD-10-CM | POA: Diagnosis not present

## 2022-04-03 DIAGNOSIS — O2343 Unspecified infection of urinary tract in pregnancy, third trimester: Secondary | ICD-10-CM | POA: Insufficient documentation

## 2022-04-03 DIAGNOSIS — F17200 Nicotine dependence, unspecified, uncomplicated: Secondary | ICD-10-CM | POA: Insufficient documentation

## 2022-04-03 DIAGNOSIS — O4703 False labor before 37 completed weeks of gestation, third trimester: Principal | ICD-10-CM | POA: Insufficient documentation

## 2022-04-03 DIAGNOSIS — O479 False labor, unspecified: Secondary | ICD-10-CM | POA: Diagnosis present

## 2022-04-03 DIAGNOSIS — A599 Trichomoniasis, unspecified: Secondary | ICD-10-CM | POA: Insufficient documentation

## 2022-04-03 DIAGNOSIS — O99333 Smoking (tobacco) complicating pregnancy, third trimester: Secondary | ICD-10-CM | POA: Insufficient documentation

## 2022-04-03 DIAGNOSIS — Z3A36 36 weeks gestation of pregnancy: Secondary | ICD-10-CM | POA: Insufficient documentation

## 2022-04-03 LAB — URINALYSIS, COMPLETE (UACMP) WITH MICROSCOPIC
Bilirubin Urine: NEGATIVE
Glucose, UA: NEGATIVE mg/dL
Hgb urine dipstick: NEGATIVE
Ketones, ur: 20 mg/dL — AB
Nitrite: POSITIVE — AB
Protein, ur: 30 mg/dL — AB
Specific Gravity, Urine: 1.017 (ref 1.005–1.030)
pH: 5 (ref 5.0–8.0)

## 2022-04-03 LAB — WET PREP, GENITAL
Clue Cells Wet Prep HPF POC: NONE SEEN
Sperm: NONE SEEN
WBC, Wet Prep HPF POC: <10 (ref <10)

## 2022-04-03 LAB — GROUP B STREP BY PCR: Group B strep by PCR: NEGATIVE

## 2022-04-03 MED ORDER — FENTANYL CITRATE (PF) 100 MCG/2ML IJ SOLN: 50.0000 ug | Freq: Once | INTRAMUSCULAR | Status: AC

## 2022-04-03 MED ORDER — LACTATED RINGERS IV BOLUS
500.0000 mL | Freq: Once | INTRAVENOUS | Status: AC
  Administered 2022-04-03: 500 mL via INTRAVENOUS

## 2022-04-03 MED ORDER — LACTATED RINGERS IV SOLN: INTRAVENOUS | Status: DC

## 2022-04-03 MED ORDER — FENTANYL CITRATE (PF) 100 MCG/2ML IJ SOLN
INTRAMUSCULAR | Status: AC
  Administered 2022-04-03: 50 ug via INTRAVENOUS
  Filled 2022-04-03: qty 2

## 2022-04-03 MED ORDER — NITROFURANTOIN MONOHYD MACRO 100 MG PO CAPS: 100.0000 mg | ORAL_CAPSULE | Freq: Two times a day (BID) | ORAL | 0 refills | Status: AC

## 2022-04-03 MED ORDER — ONDANSETRON HCL 4 MG/2ML IJ SOLN
4.0000 mg | Freq: Four times a day (QID) | INTRAMUSCULAR | Status: DC | PRN
  Administered 2022-04-03: 4 mg via INTRAVENOUS
  Filled 2022-04-03: qty 2

## 2022-04-03 MED ORDER — METRONIDAZOLE 500 MG PO TABS: 500.0000 mg | ORAL_TABLET | Freq: Two times a day (BID) | ORAL | 0 refills | Status: AC

## 2022-04-03 MED ORDER — ACETAMINOPHEN 325 MG PO TABS
650.0000 mg | ORAL_TABLET | ORAL | Status: DC | PRN
  Administered 2022-04-03: 650 mg via ORAL
  Filled 2022-04-03: qty 2

## 2022-04-03 NOTE — Progress Notes (Signed)
Pt is calling out and says she cannot do this. CNM at bedside. Pt states her back hurts constantly and her contractions are every 4 minutes. Monitor readjusted many times. Pt taken off monitor to move freely about room

## 2022-04-03 NOTE — Discharge Summary (Addendum)
Patient ID: Victoria Villarreal MRN: [id number] DOB/AGE: December 21, 1995 26 y.o.  Admit date: 04/03/2022 Discharge date: 04/03/2022  Admission Diagnoses: 26yo G5P4 at [redacted]w[redacted]d presents with uterine contractions.  Discharge Diagnoses: Trichomonas infection, UTI, no cervical change  Factors complicating pregnancy: Preterm delivery at 36 weeks Trichomonas pos on 11/18/21 - treatment sent UTI at NOB (11/18/21) by UA - treatment sent Smoker   Prenatal Procedures: NST  Consults: None  Significant Diagnostic Studies:  Results for orders placed or performed during the hospital encounter of 04/03/22 (from the past 168 hour(s))  Wet prep, genital   Collection Time: 04/03/22  1:54 AM  Result Value Ref Range   Yeast Wet Prep HPF POC PRESENT (A) NONE SEEN   Trich, Wet Prep PRESENT (A) NONE SEEN   Clue Cells Wet Prep HPF POC NONE SEEN NONE SEEN   WBC, Wet Prep HPF POC <10 <10   Sperm NONE SEEN   Group B strep by PCR   Collection Time: 04/03/22  1:54 AM   Specimen: Genital  Result Value Ref Range   Group B strep by PCR NEGATIVE NEGATIVE  Urinalysis, Complete w Microscopic Urine, Clean Catch   Collection Time: 04/03/22  2:20 AM  Result Value Ref Range   Color, Urine AMBER (A) YELLOW   APPearance CLOUDY (A) CLEAR   Specific Gravity, Urine 1.017 1.005 - 1.030   pH 5.0 5.0 - 8.0   Glucose, UA NEGATIVE NEGATIVE mg/dL   Hgb urine dipstick NEGATIVE NEGATIVE   Bilirubin Urine NEGATIVE NEGATIVE   Ketones, ur 20 (A) NEGATIVE mg/dL   Protein, ur 30 (A) NEGATIVE mg/dL   Nitrite POSITIVE (A) NEGATIVE   Leukocytes,Ua MODERATE (A) NEGATIVE   RBC / HPF 0-5 0 - 5 RBC/hpf   WBC, UA 21-50 0 - 5 WBC/hpf   Bacteria, UA MANY (A) NONE SEEN   Squamous Epithelial / LPF 0-5 0 - 5   Mucus PRESENT    Amorphous Crystal PRESENT     Treatments: IV hydration and analgesia: acetaminophen and Fentanyl   Hospital Course:  This is a 26 y.o. [id number] with IUP at [redacted]w[redacted]d seen for uterine contractions at 36 weeks, noted to have a  cervical exam of 2/50/-3.  No leaking of fluid and no bleeding.  Labs were collected, she was observed, fetal heart rate monitoring remained reassuring, and she had no signs/symptoms of preterm labor or other maternal-fetal concerns.  Her cervical exam was unchanged from admission.  She was deemed stable for discharge to home with outpatient follow up.  Discharge Physical Exam:  BP (!) 146/81   Pulse 90   Temp 98.3 F (36.8 C) (Oral)   Resp 18   Ht 5\' 7"  (1.702 m)   Wt 96.2 kg   BMI 33.20 kg/m   General: NAD CV: RRR Pulm: CTABL, nl effort ABD: s/nd/nt, gravid DVT Evaluation: LE non-ttp, no evidence of DVT on exam.  NST: FHR baseline: 120 bpm Variability: moderate Accelerations: yes Decelerations: none Category/reactivity: reactive  TOCO: irritability SVE:  Dilation: 2 Effacement (%): 50 Station: -3 Exam by:: [place]   Discharge Condition: Stable  Disposition:    Allergies as of 04/03/2022       Reactions   Hydrocodone Rash   Tramadol Rash        Medication List     STOP taking these medications    diphenhydrAMINE 25 MG tablet Commonly known as: Benadryl Allergy   naproxen 500 MG tablet Commonly known as: Naprosyn   oxyCODONE-acetaminophen 7.5-325 MG tablet Commonly  known as: Percocet   sulfamethoxazole-trimethoprim 800-160 MG tablet Commonly known as: BACTRIM DS       TAKE these medications    metroNIDAZOLE 500 MG tablet Commonly known as: FLAGYL Take 1 tablet (500 mg total) by mouth 2 (two) times daily for 7 days.   nitrofurantoin (macrocrystal-monohydrate) 100 MG capsule Commonly known as: MACROBID Take 1 capsule (100 mg total) by mouth 2 (two) times daily for 7 days.         SignedHaroldine Laws, CNM 04/03/2022 3:29 AM  ADDENDUM: Note reviewed after patient has been discharged.  Noted elevated BP at hospital. No repeat blood pressure taken.  She has since been seen in clinic and had a normal blood pressure. So, will continue  to monitor closely for gHTN or preeclampsia.    Thomasene Mohair, MD, [facility] 04/09/2022 10:19 AM

## 2022-04-14 ENCOUNTER — Encounter: Payer: Self-pay | Admitting: Obstetrics and Gynecology

## 2022-04-14 ENCOUNTER — Observation Stay
Admission: EM | Admit: 2022-04-14 | Discharge: 2022-04-15 | Disposition: A | Discharge status: Home / Self Care | Payer: Medicaid Other

## 2022-04-14 ENCOUNTER — Other Ambulatory Visit: Payer: Self-pay

## 2022-04-14 DIAGNOSIS — O99333 Smoking (tobacco) complicating pregnancy, third trimester: Secondary | ICD-10-CM | POA: Diagnosis not present

## 2022-04-14 DIAGNOSIS — O471 False labor at or after 37 completed weeks of gestation: Principal | ICD-10-CM | POA: Insufficient documentation

## 2022-04-14 DIAGNOSIS — Z3A37 37 weeks gestation of pregnancy: Secondary | ICD-10-CM | POA: Insufficient documentation

## 2022-04-14 DIAGNOSIS — O479 False labor, unspecified: Secondary | ICD-10-CM | POA: Diagnosis present

## 2022-04-14 DIAGNOSIS — F17210 Nicotine dependence, cigarettes, uncomplicated: Secondary | ICD-10-CM | POA: Insufficient documentation

## 2022-04-14 MED ORDER — ACETAMINOPHEN 500 MG PO TABS: 1000.0000 mg | ORAL_TABLET | Freq: Four times a day (QID) | ORAL | Status: DC | PRN

## 2022-04-14 NOTE — OB Triage Note (Signed)
Pt arrived for [facility] triage with complaints of contractions that have been worsening over the last 30 minutes. Pt denies LOF or active vaginal bleeding. Pt placed on EFM and Toco to non tender area of abdomen. Lucile Crater, CNM provider at nurses station and SBAR given of patient and chief complaint. Will notify patient on plan of care.

## 2022-04-15 DIAGNOSIS — O471 False labor at or after 37 completed weeks of gestation: Secondary | ICD-10-CM | POA: Diagnosis not present

## 2022-04-15 LAB — URINALYSIS, COMPLETE (UACMP) WITH MICROSCOPIC
Bilirubin Urine: NEGATIVE
Glucose, UA: NEGATIVE mg/dL
Hgb urine dipstick: NEGATIVE
Ketones, ur: NEGATIVE mg/dL
Nitrite: NEGATIVE
Protein, ur: NEGATIVE mg/dL
Specific Gravity, Urine: 1.008 (ref 1.005–1.030)
WBC, UA: >50 WBC/hpf — ABNORMAL HIGH (ref 0–5)
pH: 6 (ref 5.0–8.0)

## 2022-04-15 NOTE — Discharge Summary (Signed)
Victoria Villarreal is a 26 y.o. female. She is at [redacted]w[redacted]d gestation. No LMP recorded. Patient is pregnant. Estimated Date of Delivery: 05/01/22  Prenatal care site: [facility]  Chief complaint: contractions   HPI: Victoria Villarreal presents to L&D with complaints of contractions that became worse about 30 minutes prior to coming triage.  Denies LOF or vaginal bleeding.  Endorses good fetal movement. Victoria Villarreal was evaluated in triage on 04/04/2031 and treated for a UTI and trichomonas.  She did not complete treatment with flagyl but restarted the antibiotics recently.  She has about 4 pills left.    Factors complicating pregnancy: History of preterm delivery at 36 weeks  Trichomonas infection in pregnancy - currently taking flagyl for treatment  UTI in pregnancy - completed antibiotics  Tobacco use in pregnancy   S: Resting comfortably, no VB.no LOF,  Active fetal movement.   Maternal Medical History:  Past Medical Hx:  has a past medical history of Anemia, Depression, and Trichomoniasis.    Past Surgical Hx:  has a past surgical history that includes No past surgeries.   Allergies  Allergen Reactions   Hydrocodone Rash   Tramadol Rash     Prior to Admission medications   Medication Sig Start Date End Date Taking? Authorizing Provider  Prenatal Vit-Fe Fumarate-FA (PRENATAL MULTIVITAMIN) TABS tablet Take 1 tablet by mouth daily at 12 noon.   Yes [provider]    Social History: She  reports that she has been smoking cigarettes. She has been smoking an average of .25 packs per day. She has never used smokeless tobacco. She reports that she does not currently use alcohol after a past usage of about 1.0 standard drink of alcohol per week. She reports current drug use. Drug: Marijuana.  Family History: family history non-contributory, no history of gyn cancers  Review of Systems: A full review of systems was performed and negative except as noted in the HPI.    O:  BP 117/77    Pulse 75  Results for orders placed or performed during the hospital encounter of 04/14/22 (from the past 48 hour(s))  Urinalysis, Complete w Microscopic Urine, Clean Catch   Collection Time: 04/15/22 12:46 AM  Result Value Ref Range   Color, Urine YELLOW (A) YELLOW   APPearance CLOUDY (A) CLEAR   Specific Gravity, Urine 1.008 1.005 - 1.030   pH 6.0 5.0 - 8.0   Glucose, UA NEGATIVE NEGATIVE mg/dL   Hgb urine dipstick NEGATIVE NEGATIVE   Bilirubin Urine NEGATIVE NEGATIVE   Ketones, ur NEGATIVE NEGATIVE mg/dL   Protein, ur NEGATIVE NEGATIVE mg/dL   Nitrite NEGATIVE NEGATIVE   Leukocytes,Ua LARGE (A) NEGATIVE   RBC / HPF 6-10 0 - 5 RBC/hpf   WBC, UA >50 (H) 0 - 5 WBC/hpf   Bacteria, UA RARE (A) NONE SEEN   Squamous Epithelial / LPF 0-5 0 - 5   Mucus PRESENT      Constitutional: NAD, AAOx3  HE/ENT: extraocular movements grossly intact, moist mucous membranes CV: RRR PULM: nl respiratory effort Abd: gravid, non-tender, non-distended, soft  Ext: Non-tender, Nonedmeatous Psych: mood appropriate, speech normal SVE: Dilation: 2.5 Effacement (%): 50 Station: -3 Exam by:: D. Means, RN  -no cervical change over approximately 4 hours   Fetal Monitor: Baseline: 125 bpm Variability: moderate Accels: Present Decels: variable x 1 followed by reactive tracing  Toco: 2-7, mild to moderate contractions   Category: I NST: reactive    Assessment: 26 y.o. 102w5d here for antenatal surveillance during  pregnancy.  Principle diagnosis: Uterine contractions during pregnancy [O47.9]   Plan: Labor: not present.  NST reviewed - reactive  Fetal Wellbeing: Reassuring Cat 1 tracing.  Strict labor precautions reviewed and when to return to the hospital  Continue with flagyl treatment - will follow up in office  D/c home stable, precautions reviewed, follow-up as scheduled.   ----- Victoria Villarreal, CNM Certified Nurse Midwife [place]  [facility]

## 2022-04-20 ENCOUNTER — Encounter: Payer: Self-pay | Admitting: Obstetrics and Gynecology

## 2022-04-20 ENCOUNTER — Other Ambulatory Visit: Payer: Self-pay

## 2022-04-20 ENCOUNTER — Observation Stay
Admission: EM | Admit: 2022-04-20 | Discharge: 2022-04-20 | Disposition: A | Discharge status: Home / Self Care | Payer: Medicaid Other | Attending: Obstetrics and Gynecology | Admitting: Obstetrics and Gynecology

## 2022-04-20 ENCOUNTER — Other Ambulatory Visit: Payer: Self-pay | Admitting: Obstetrics and Gynecology

## 2022-04-20 DIAGNOSIS — O99333 Smoking (tobacco) complicating pregnancy, third trimester: Secondary | ICD-10-CM | POA: Diagnosis not present

## 2022-04-20 DIAGNOSIS — O139 Gestational [pregnancy-induced] hypertension without significant proteinuria, unspecified trimester: Secondary | ICD-10-CM | POA: Diagnosis present

## 2022-04-20 DIAGNOSIS — O99013 Anemia complicating pregnancy, third trimester: Secondary | ICD-10-CM | POA: Insufficient documentation

## 2022-04-20 DIAGNOSIS — O99213 Obesity complicating pregnancy, third trimester: Secondary | ICD-10-CM | POA: Insufficient documentation

## 2022-04-20 DIAGNOSIS — R7309 Other abnormal glucose: Secondary | ICD-10-CM | POA: Insufficient documentation

## 2022-04-20 DIAGNOSIS — O163 Unspecified maternal hypertension, third trimester: Secondary | ICD-10-CM

## 2022-04-20 DIAGNOSIS — Z3689 Encounter for other specified antenatal screening: Principal | ICD-10-CM | POA: Insufficient documentation

## 2022-04-20 DIAGNOSIS — F17200 Nicotine dependence, unspecified, uncomplicated: Secondary | ICD-10-CM | POA: Diagnosis not present

## 2022-04-20 DIAGNOSIS — O0993 Supervision of high risk pregnancy, unspecified, third trimester: Secondary | ICD-10-CM | POA: Diagnosis not present

## 2022-04-20 DIAGNOSIS — Z3A28 28 weeks gestation of pregnancy: Secondary | ICD-10-CM | POA: Insufficient documentation

## 2022-04-20 DIAGNOSIS — D649 Anemia, unspecified: Secondary | ICD-10-CM | POA: Diagnosis not present

## 2022-04-20 LAB — CBC WITH DIFFERENTIAL/PLATELET
Abs Immature Granulocytes: 0.03 10*3/uL (ref 0.00–0.07)
Basophils Absolute: 0 10*3/uL (ref 0.0–0.1)
Basophils Relative: 0 %
Eosinophils Absolute: 0.1 10*3/uL (ref 0.0–0.5)
Eosinophils Relative: 1 %
HCT: 31.6 % — ABNORMAL LOW (ref 36.0–46.0)
Hemoglobin: 9.8 g/dL — ABNORMAL LOW (ref 12.0–15.0)
Immature Granulocytes: 1 %
Lymphocytes Relative: 26 %
Lymphs Abs: 1.6 10*3/uL (ref 0.7–4.0)
MCH: 27.1 pg (ref 26.0–34.0)
MCHC: 31 g/dL (ref 30.0–36.0)
MCV: 87.3 fL (ref 80.0–100.0)
Monocytes Absolute: 0.6 10*3/uL (ref 0.1–1.0)
Monocytes Relative: 9 %
Neutro Abs: 3.8 10*3/uL (ref 1.7–7.7)
Neutrophils Relative %: 63 %
Platelets: 287 10*3/uL (ref 150–400)
RBC: 3.62 MIL/uL — ABNORMAL LOW (ref 3.87–5.11)
RDW: 13.8 % (ref 11.5–15.5)
WBC: 6.1 10*3/uL (ref 4.0–10.5)
nRBC: 0 % (ref 0.0–0.2)

## 2022-04-20 LAB — COMPREHENSIVE METABOLIC PANEL
ALT: 9 U/L (ref 0–44)
AST: 13 U/L — ABNORMAL LOW (ref 15–41)
Albumin: 2.9 g/dL — ABNORMAL LOW (ref 3.5–5.0)
Alkaline Phosphatase: 132 U/L — ABNORMAL HIGH (ref 38–126)
Anion gap: 7 (ref 5–15)
BUN: <5 mg/dL — ABNORMAL LOW (ref 6–20)
CO2: 23 mmol/L (ref 22–32)
Calcium: 8.6 mg/dL — ABNORMAL LOW (ref 8.9–10.3)
Chloride: 107 mmol/L (ref 98–111)
Creatinine, Ser: 0.46 mg/dL (ref 0.44–1.00)
GFR, Estimated: >60 mL/min (ref >60)
Glucose, Bld: 106 mg/dL — ABNORMAL HIGH (ref 70–99)
Potassium: 3.4 mmol/L — ABNORMAL LOW (ref 3.5–5.1)
Sodium: 137 mmol/L (ref 135–145)
Total Bilirubin: 0.7 mg/dL (ref 0.3–1.2)
Total Protein: 6.5 g/dL (ref 6.5–8.1)

## 2022-04-20 LAB — PROTEIN / CREATININE RATIO, URINE
Creatinine, Urine: 118 mg/dL
Protein Creatinine Ratio: 0.13 mg/mg{Cre} (ref 0.00–0.15)
Total Protein, Urine: 15 mg/dL

## 2022-04-20 MED ORDER — LABETALOL HCL 5 MG/ML IV SOLN: 80.0000 mg | INTRAVENOUS | Status: DC | PRN

## 2022-04-20 MED ORDER — LABETALOL HCL 5 MG/ML IV SOLN: 40.0000 mg | INTRAVENOUS | Status: DC | PRN

## 2022-04-20 MED ORDER — HYDRALAZINE HCL 20 MG/ML IJ SOLN: 10.0000 mg | INTRAMUSCULAR | Status: DC | PRN

## 2022-04-20 MED ORDER — LABETALOL HCL 5 MG/ML IV SOLN: 20.0000 mg | INTRAVENOUS | Status: DC | PRN

## 2022-04-20 NOTE — Discharge Summary (Signed)
RN reviewed discharge instructions with patient. Gave patient opportunity for questions. All questions answered at this time. Pt verbalized understanding. Pt to return on 7/14 at midnight for induction of labor. Pt discharged home with her significant other.

## 2022-04-20 NOTE — Discharge Summary (Signed)
Patient ID: Victoria Villarreal MRN: [id number] DOB/AGE: 1996-09-01 26 y.o.  Admit date: 04/20/2022 Discharge date: 04/20/2022  Admission Diagnoses: 26yo G5P4 at [redacted]w[redacted]d sent from the office for Pre-E evaluation. Denies UCs, cramping, or LOF. Denies HA, changes in vision or epigastric pain.  Discharge Diagnoses: No Pre-E  Factors complicating pregnancy: 1. Anemia 2. H/o PTD at 36 weeks 3. H/o trich this pregnancy 4. GBS + by NOB urine 5. Smoker 6. Elevated 1hr GTT - still has not completed a 3 hr GTT  Prenatal Procedures: NST  Consults: None  Significant Diagnostic Studies:  Results for orders placed or performed during the hospital encounter of 04/20/22 (from the past 168 hour(s))  Protein / creatinine ratio, urine   Collection Time: 04/20/22  1:05 PM  Result Value Ref Range   Creatinine, Urine 118 mg/dL   Total Protein, Urine 15 mg/dL   Protein Creatinine Ratio 0.13 0.00 - 0.15 mg/mg[Cre]  Comprehensive metabolic panel   Collection Time: 04/20/22  1:15 PM  Result Value Ref Range   Sodium 137 135 - 145 mmol/L   Potassium 3.4 (L) 3.5 - 5.1 mmol/L   Chloride 107 98 - 111 mmol/L   CO2 23 22 - 32 mmol/L   Glucose, Bld 106 (H) 70 - 99 mg/dL   BUN <5 (L) 6 - 20 mg/dL   Creatinine, Ser 0.96 0.44 - 1.00 mg/dL   Calcium 8.6 (L) 8.9 - 10.3 mg/dL   Total Protein 6.5 6.5 - 8.1 g/dL   Albumin 2.9 (L) 3.5 - 5.0 g/dL   AST 13 (L) 15 - 41 U/L   ALT 9 0 - 44 U/L   Alkaline Phosphatase 132 (H) 38 - 126 U/L   Total Bilirubin 0.7 0.3 - 1.2 mg/dL   GFR, Estimated >28 >36 mL/min   Anion gap 7 5 - 15  CBC with Differential/Platelet   Collection Time: 04/20/22  1:15 PM  Result Value Ref Range   WBC 6.1 4.0 - 10.5 K/uL   RBC 3.62 (L) 3.87 - 5.11 MIL/uL   Hemoglobin 9.8 (L) 12.0 - 15.0 g/dL   HCT 62.9 (L) 47.6 - 54.6 %   MCV 87.3 80.0 - 100.0 fL   MCH 27.1 26.0 - 34.0 pg   MCHC 31.0 30.0 - 36.0 g/dL   RDW 50.3 54.6 - 56.8 %   Platelets 287 150 - 400 K/uL   nRBC 0.0 0.0 - 0.2 %    Neutrophils Relative % 63 %   Neutro Abs 3.8 1.7 - 7.7 K/uL   Lymphocytes Relative 26 %   Lymphs Abs 1.6 0.7 - 4.0 K/uL   Monocytes Relative 9 %   Monocytes Absolute 0.6 0.1 - 1.0 K/uL   Eosinophils Relative 1 %   Eosinophils Absolute 0.1 0.0 - 0.5 K/uL   Basophils Relative 0 %   Basophils Absolute 0.0 0.0 - 0.1 K/uL   Immature Granulocytes 1 %   Abs Immature Granulocytes 0.03 0.00 - 0.07 K/uL  Results for orders placed or performed during the hospital encounter of 04/14/22 (from the past 168 hour(s))  Urinalysis, Complete w Microscopic Urine, Clean Catch   Collection Time: 04/15/22 12:46 AM  Result Value Ref Range   Color, Urine YELLOW (A) YELLOW   APPearance CLOUDY (A) CLEAR   Specific Gravity, Urine 1.008 1.005 - 1.030   pH 6.0 5.0 - 8.0   Glucose, UA NEGATIVE NEGATIVE mg/dL   Hgb urine dipstick NEGATIVE NEGATIVE   Bilirubin Urine NEGATIVE NEGATIVE   Ketones, ur NEGATIVE  NEGATIVE mg/dL   Protein, ur NEGATIVE NEGATIVE mg/dL   Nitrite NEGATIVE NEGATIVE   Leukocytes,Ua LARGE (A) NEGATIVE   RBC / HPF 6-10 0 - 5 RBC/hpf   WBC, UA >50 (H) 0 - 5 WBC/hpf   Bacteria, UA RARE (A) NONE SEEN   Squamous Epithelial / LPF 0-5 0 - 5   Mucus PRESENT     Treatments: none  Hospital Course:  This is a 26 y.o. [id number] with IUP at [redacted]w[redacted]d seen for Pre-E evaluation.  BPs WNL and labs WNL. She was observed, fetal heart rate monitoring remained reassuring, and she had no signs/symptoms of labor or other maternal-fetal concerns.  Dr. Feliberto Gottron was consulted and informed of Pt's status. She was deemed stable for discharge to home with outpatient follow up.  Discharge Physical Exam:  BP 124/71   Pulse 82   Temp 98.4 F (36.9 C) (Oral)   Resp 18   Ht 5\' 7"  (1.702 m)   Wt 97.1 kg   SpO2 100%   BMI 33.52 kg/m   Vitals:   04/20/22 1303 04/20/22 1312 04/20/22 1327 04/20/22 1342  BP: 130/74 127/76 120/76 123/76   04/20/22 1357 04/20/22 1412  BP: 126/70 124/71     General: NAD CV:  RRR Pulm: CTABL, nl effort ABD: s/nd/nt, gravid DVT Evaluation: LE non-ttp, no evidence of DVT on exam.  NST: FHR baseline: 130 bpm Variability: moderate Accelerations: yes Decelerations: none Category/reactivity: reactive  TOCO: quiet SVE: deferred (Checked in the office)     Discharge Condition: Stable  Disposition: Discharge disposition: 01-Home or Self Care        Allergies as of 04/20/2022       Reactions   Hydrocodone Rash   Tramadol Rash        Medication List     TAKE these medications    prenatal multivitamin Tabs tablet Take 1 tablet by mouth daily at 12 noon.        Follow-up Information     [facility] Follow up on 04/24/2022.   Specialty: Obstetrics and Gynecology Why: Come in through the emergency department at 12:01 AM where they will bring you up to labor and delivery for induction of labor. Contact information: [address] [place] [phone]                Signed:  [phone] 04/20/2022 3:55 PM

## 2022-04-20 NOTE — OB Triage Note (Signed)
PT presents from the office for PIH eval. PT states she had a headache this morning that went away. Pt denies blurry vision or epigastric pain. Pt denies bleeding or LOF. Pt reports positive fetal movement. Initial BP 130/74. BP cycling q 15 mins. Pt has no edema or clonus with +1 reflexes. VSS. Will continue to monitor.

## 2022-04-20 NOTE — Progress Notes (Signed)
Victoria Villarreal is a 26 y.o. [id number] female at [redacted]w[redacted]d dated by 10 week u/s.  She presents to L&D from the office for Pre-E workup  Pregnancy Issues: 1. Anemia 2. H/o PTD at 36 weeks 3. H/o trich this pregnancy 4. GBS + by NOB urine 5. Smoker 6. Elevated 1hr GTT - still has not completed a 3 hr GTT   Prenatal care site: [facility]   EFW: 03/31/22 5lb15oz [phone]) = 62%   Pertinent Results:  Prenatal Labs: Blood type/Rh O pos  Antibody screen neg  Rubella Immune  Varicella Immune  RPR NR  HBsAg Neg  HIV NR  GC neg  Chlamydia neg  Genetic screening negative  1 hour GTT 148  3 hour GTT   GBS Pos per urine    5. Post Partum Planning: - Infant feeding: pumping - Contraception: Liletta IUD - Tdap unknown - Flu due in season  [place], [place] 04/20/2022 10:10 AM

## 2022-04-23 ENCOUNTER — Inpatient Hospital Stay
Admission: EM | Admit: 2022-04-23 | Discharge: 2022-04-24 | DRG: 806 | Disposition: A | Discharge status: Home / Self Care | Payer: Medicaid Other | Attending: Obstetrics | Admitting: Obstetrics

## 2022-04-23 ENCOUNTER — Inpatient Hospital Stay: Payer: Medicaid Other | Admitting: Anesthesiology

## 2022-04-23 ENCOUNTER — Encounter: Payer: Self-pay | Admitting: Obstetrics and Gynecology

## 2022-04-23 ENCOUNTER — Other Ambulatory Visit: Payer: Self-pay

## 2022-04-23 DIAGNOSIS — O134 Gestational [pregnancy-induced] hypertension without significant proteinuria, complicating childbirth: Secondary | ICD-10-CM | POA: Diagnosis present

## 2022-04-23 DIAGNOSIS — F17210 Nicotine dependence, cigarettes, uncomplicated: Secondary | ICD-10-CM | POA: Diagnosis present

## 2022-04-23 DIAGNOSIS — O26893 Other specified pregnancy related conditions, third trimester: Secondary | ICD-10-CM | POA: Diagnosis present

## 2022-04-23 DIAGNOSIS — D62 Acute posthemorrhagic anemia: Secondary | ICD-10-CM | POA: Diagnosis not present

## 2022-04-23 DIAGNOSIS — O139 Gestational [pregnancy-induced] hypertension without significant proteinuria, unspecified trimester: Secondary | ICD-10-CM | POA: Diagnosis present

## 2022-04-23 DIAGNOSIS — O99824 Streptococcus B carrier state complicating childbirth: Secondary | ICD-10-CM | POA: Diagnosis present

## 2022-04-23 DIAGNOSIS — O99334 Smoking (tobacco) complicating childbirth: Secondary | ICD-10-CM | POA: Diagnosis present

## 2022-04-23 DIAGNOSIS — Z3A38 38 weeks gestation of pregnancy: Secondary | ICD-10-CM | POA: Diagnosis not present

## 2022-04-23 DIAGNOSIS — O9081 Anemia of the puerperium: Secondary | ICD-10-CM | POA: Diagnosis not present

## 2022-04-23 LAB — CBC
HCT: 34.5 % — ABNORMAL LOW (ref 36.0–46.0)
HCT: 32.3 % — ABNORMAL LOW (ref 36.0–46.0)
Hemoglobin: 11 g/dL — ABNORMAL LOW (ref 12.0–15.0)
Hemoglobin: 10 g/dL — ABNORMAL LOW (ref 12.0–15.0)
MCH: 27.4 pg (ref 26.0–34.0)
MCH: 26.8 pg (ref 26.0–34.0)
MCHC: 31.9 g/dL (ref 30.0–36.0)
MCHC: 31 g/dL (ref 30.0–36.0)
MCV: 85.8 fL (ref 80.0–100.0)
MCV: 86.6 fL (ref 80.0–100.0)
Platelets: 333 10*3/uL (ref 150–400)
Platelets: 304 10*3/uL (ref 150–400)
RBC: 4.02 MIL/uL (ref 3.87–5.11)
RBC: 3.73 MIL/uL — ABNORMAL LOW (ref 3.87–5.11)
RDW: 14 % (ref 11.5–15.5)
RDW: 13.9 % (ref 11.5–15.5)
WBC: 10.2 10*3/uL (ref 4.0–10.5)
WBC: 12.2 10*3/uL — ABNORMAL HIGH (ref 4.0–10.5)
nRBC: 0 % (ref 0.0–0.2)
nRBC: 0 % (ref 0.0–0.2)

## 2022-04-23 LAB — COMPREHENSIVE METABOLIC PANEL
ALT: 9 U/L (ref 0–44)
AST: 18 U/L (ref 15–41)
Albumin: 3.1 g/dL — ABNORMAL LOW (ref 3.5–5.0)
Alkaline Phosphatase: 155 U/L — ABNORMAL HIGH (ref 38–126)
Anion gap: 9 (ref 5–15)
BUN: <5 mg/dL — ABNORMAL LOW (ref 6–20)
CO2: 19 mmol/L — ABNORMAL LOW (ref 22–32)
Calcium: 8.7 mg/dL — ABNORMAL LOW (ref 8.9–10.3)
Chloride: 108 mmol/L (ref 98–111)
Creatinine, Ser: 0.48 mg/dL (ref 0.44–1.00)
GFR, Estimated: >60 mL/min (ref >60)
Glucose, Bld: 104 mg/dL — ABNORMAL HIGH (ref 70–99)
Potassium: 3.7 mmol/L (ref 3.5–5.1)
Sodium: 136 mmol/L (ref 135–145)
Total Bilirubin: 0.6 mg/dL (ref 0.3–1.2)
Total Protein: 7 g/dL (ref 6.5–8.1)

## 2022-04-23 LAB — RPR: RPR Ser Ql: NONREACTIVE

## 2022-04-23 LAB — TYPE AND SCREEN
ABO/RH(D): O POS
Antibody Screen: NEGATIVE

## 2022-04-23 MED ORDER — SODIUM CHLORIDE 0.9 % IV SOLN
5.0000 10*6.[IU] | Freq: Once | INTRAVENOUS | Status: AC
  Administered 2022-04-23: 5 10*6.[IU] via INTRAVENOUS

## 2022-04-23 MED ORDER — OXYTOCIN-SODIUM CHLORIDE 30-0.9 UT/500ML-% IV SOLN: 2.5000 [IU]/h | INTRAVENOUS | Status: DC

## 2022-04-23 MED ORDER — FLEET ENEMA 7-19 GM/118ML RE ENEM: 1.0000 | ENEMA | Freq: Every day | RECTAL | Status: DC | PRN

## 2022-04-23 MED ORDER — SOD CITRATE-CITRIC ACID 500-334 MG/5ML PO SOLN: 30.0000 mL | ORAL | Status: DC | PRN

## 2022-04-23 MED ORDER — EPHEDRINE 5 MG/ML INJ: 10.0000 mg | INTRAVENOUS | Status: DC | PRN

## 2022-04-23 MED ORDER — NICOTINE 14 MG/24HR TD PT24
14.0000 mg | MEDICATED_PATCH | Freq: Every day | TRANSDERMAL | Status: DC
  Administered 2022-04-23: 14 mg via TRANSDERMAL
  Filled 2022-04-23: qty 1

## 2022-04-23 MED ORDER — ACETAMINOPHEN 325 MG PO TABS
650.0000 mg | ORAL_TABLET | ORAL | Status: DC | PRN
  Administered 2022-04-23 – 2022-04-24 (×6): 650 mg via ORAL
  Filled 2022-04-23 (×6): qty 2

## 2022-04-23 MED ORDER — OXYCODONE HCL 5 MG PO TABS
5.0000 mg | ORAL_TABLET | ORAL | Status: DC | PRN
  Administered 2022-04-23 – 2022-04-24 (×7): 5 mg via ORAL
  Filled 2022-04-23 (×8): qty 1

## 2022-04-23 MED ORDER — SODIUM CHLORIDE 0.9% FLUSH: 3.0000 mL | Freq: Two times a day (BID) | INTRAVENOUS | Status: DC

## 2022-04-23 MED ORDER — SENNOSIDES-DOCUSATE SODIUM 8.6-50 MG PO TABS
2.0000 | ORAL_TABLET | ORAL | Status: DC
  Administered 2022-04-23 – 2022-04-24 (×2): 2 via ORAL
  Filled 2022-04-23 (×2): qty 2

## 2022-04-23 MED ORDER — OXYTOCIN BOLUS FROM INFUSION
333.0000 mL | Freq: Once | INTRAVENOUS | Status: AC
  Administered 2022-04-23: 333 mL via INTRAVENOUS

## 2022-04-23 MED ORDER — MISOPROSTOL 200 MCG PO TABS
ORAL_TABLET | ORAL | Status: AC
  Filled 2022-04-23: qty 4

## 2022-04-23 MED ORDER — METHYLERGONOVINE MALEATE 0.2 MG/ML IJ SOLN
INTRAMUSCULAR | Status: AC
  Filled 2022-04-23: qty 1

## 2022-04-23 MED ORDER — LIDOCAINE-EPINEPHRINE (PF) 1.5 %-1:200000 IJ SOLN
INTRAMUSCULAR | Status: DC | PRN
  Administered 2022-04-23: 3 mL via EPIDURAL

## 2022-04-23 MED ORDER — SODIUM CHLORIDE 0.9 % IV SOLN
INTRAVENOUS | Status: AC
  Filled 2022-04-23: qty 5

## 2022-04-23 MED ORDER — IBUPROFEN 600 MG PO TABS
600.0000 mg | ORAL_TABLET | Freq: Four times a day (QID) | ORAL | Status: DC
  Administered 2022-04-23 – 2022-04-24 (×6): 600 mg via ORAL
  Filled 2022-04-23 (×5): qty 1

## 2022-04-23 MED ORDER — FENTANYL-BUPIVACAINE-NACL 0.5-0.125-0.9 MG/250ML-% EP SOLN
12.0000 mL/h | EPIDURAL | Status: DC | PRN
  Administered 2022-04-23: 12 mL/h via EPIDURAL
  Filled 2022-04-23: qty 250

## 2022-04-23 MED ORDER — SODIUM CHLORIDE 0.9 % IV SOLN
INTRAVENOUS | Status: DC | PRN
  Administered 2022-04-23 (×2): 5 mL via EPIDURAL

## 2022-04-23 MED ORDER — SODIUM CHLORIDE 0.9% FLUSH: 3.0000 mL | INTRAVENOUS | Status: DC | PRN

## 2022-04-23 MED ORDER — PENICILLIN G POT IN DEXTROSE 60000 UNIT/ML IV SOLN: 3.0000 10*6.[IU] | Freq: Once | INTRAVENOUS | Status: DC

## 2022-04-23 MED ORDER — PHENYLEPHRINE 80 MCG/ML (10ML) SYRINGE FOR IV PUSH (FOR BLOOD PRESSURE SUPPORT): 80.0000 ug | PREFILLED_SYRINGE | INTRAVENOUS | Status: DC | PRN

## 2022-04-23 MED ORDER — LACTATED RINGERS IV SOLN: 500.0000 mL | INTRAVENOUS | Status: DC | PRN

## 2022-04-23 MED ORDER — LIDOCAINE HCL (PF) 1 % IJ SOLN: 30.0000 mL | INTRAMUSCULAR | Status: DC | PRN

## 2022-04-23 MED ORDER — SIMETHICONE 80 MG PO CHEW: 80.0000 mg | CHEWABLE_TABLET | ORAL | Status: DC | PRN

## 2022-04-23 MED ORDER — CARBOPROST TROMETHAMINE 250 MCG/ML IM SOLN
INTRAMUSCULAR | Status: AC
  Filled 2022-04-23: qty 1

## 2022-04-23 MED ORDER — ONDANSETRON HCL 4 MG PO TABS: 4.0000 mg | ORAL_TABLET | ORAL | Status: DC | PRN

## 2022-04-23 MED ORDER — LACTATED RINGERS IV SOLN
500.0000 mL | Freq: Once | INTRAVENOUS | Status: AC
  Administered 2022-04-23: 500 mL via INTRAVENOUS

## 2022-04-23 MED ORDER — ONDANSETRON HCL 4 MG/2ML IJ SOLN: 4.0000 mg | INTRAMUSCULAR | Status: DC | PRN

## 2022-04-23 MED ORDER — AMMONIA AROMATIC IN INHA
RESPIRATORY_TRACT | Status: AC
  Filled 2022-04-23: qty 10

## 2022-04-23 MED ORDER — OXYTOCIN 10 UNIT/ML IJ SOLN
INTRAMUSCULAR | Status: AC
  Filled 2022-04-23: qty 2

## 2022-04-23 MED ORDER — PENICILLIN G POT IN DEXTROSE 60000 UNIT/ML IV SOLN
3.0000 10*6.[IU] | INTRAVENOUS | Status: DC
Start: 2022-04-23 — End: 2022-04-23

## 2022-04-23 MED ORDER — BISACODYL 10 MG RE SUPP: 10.0000 mg | Freq: Every day | RECTAL | Status: DC | PRN

## 2022-04-23 MED ORDER — SODIUM CHLORIDE 0.9 % IV SOLN: 250.0000 mL | INTRAVENOUS | Status: DC | PRN

## 2022-04-23 MED ORDER — DIPHENHYDRAMINE HCL 50 MG/ML IJ SOLN: 12.5000 mg | INTRAMUSCULAR | Status: DC | PRN

## 2022-04-23 MED ORDER — DIPHENHYDRAMINE HCL 25 MG PO CAPS: 25.0000 mg | ORAL_CAPSULE | Freq: Four times a day (QID) | ORAL | Status: DC | PRN

## 2022-04-23 MED ORDER — TRANEXAMIC ACID-NACL 1000-0.7 MG/100ML-% IV SOLN
INTRAVENOUS | Status: AC
  Filled 2022-04-23: qty 100

## 2022-04-23 MED ORDER — COCONUT OIL OIL: 1.0000 | TOPICAL_OIL | Status: DC | PRN

## 2022-04-23 MED ORDER — ONDANSETRON HCL 4 MG/2ML IJ SOLN: 4.0000 mg | Freq: Four times a day (QID) | INTRAMUSCULAR | Status: DC | PRN

## 2022-04-23 MED ORDER — DIBUCAINE (PERIANAL) 1 % EX OINT: 1.0000 | TOPICAL_OINTMENT | CUTANEOUS | Status: DC | PRN

## 2022-04-23 MED ORDER — ACETAMINOPHEN 325 MG PO TABS: 650.0000 mg | ORAL_TABLET | ORAL | Status: DC | PRN

## 2022-04-23 MED ORDER — OXYTOCIN-SODIUM CHLORIDE 30-0.9 UT/500ML-% IV SOLN
INTRAVENOUS | Status: AC
  Filled 2022-04-23: qty 500

## 2022-04-23 MED ORDER — LACTATED RINGERS IV SOLN: INTRAVENOUS | Status: DC

## 2022-04-23 MED ORDER — ZOLPIDEM TARTRATE 5 MG PO TABS: 5.0000 mg | ORAL_TABLET | Freq: Every evening | ORAL | Status: DC | PRN

## 2022-04-23 MED ORDER — WITCH HAZEL-GLYCERIN EX PADS: 1.0000 | MEDICATED_PAD | CUTANEOUS | Status: DC | PRN

## 2022-04-23 MED ORDER — PRENATAL MULTIVITAMIN CH
1.0000 | ORAL_TABLET | Freq: Every day | ORAL | Status: DC
  Administered 2022-04-23 – 2022-04-24 (×2): 1 via ORAL
  Filled 2022-04-23 (×2): qty 1

## 2022-04-23 MED ORDER — LIDOCAINE HCL (PF) 1 % IJ SOLN
INTRAMUSCULAR | Status: AC
  Filled 2022-04-23: qty 30

## 2022-04-23 MED ORDER — BENZOCAINE-MENTHOL 20-0.5 % EX AERO: 1.0000 | INHALATION_SPRAY | CUTANEOUS | Status: DC | PRN

## 2022-04-23 MED ORDER — LIDOCAINE HCL (PF) 1 % IJ SOLN
INTRAMUSCULAR | Status: DC | PRN
  Administered 2022-04-23: 1 mL via SUBCUTANEOUS

## 2022-04-23 NOTE — H&P (Signed)
OB History & Physical   History of Present Illness:   Chief Complaint: uterine contractions  HPI:  Victoria Villarreal is a 26 y.o. [id number] female at [redacted]w[redacted]d dated by [place].  She presents to L&D for uterine contractions every 1-2 minutes  Reports active fetal movement  Contractions: every 2 to 3 minutes LOF/SROM: none Vaginal bleeding: none  Factors complicating pregnancy:  GHTN Elevated 1 hour gtt, normal 3 hr gtt Anemia Hx of preterm labor at 36 weeks Trichomonas pos on 11/18/21, TOC 12/16/21- neg GBS bacteruria  Tobacco smoker in pregnancy Hx of depression Hx of marijuana use  Patient Active Problem List   Diagnosis Date Noted   Normal labor and delivery 04/23/2022   Elevated blood pressure affecting pregnancy in third trimester, antepartum 04/20/2022   Uterine contractions during pregnancy 04/14/2022   Uterine contractions at greater than 20 weeks of gestation 04/03/2022   Supervision of high risk pregnancy in third trimester 11/10/2021   Indication for care in labor or delivery 04/01/2018   Labor and delivery, indication for care 03/08/2018   Preterm uterine contractions in third trimester, antepartum 03/02/2018   Nausea and vomiting during pregnancy 12/31/2017   Nausea/vomiting in pregnancy 12/31/2017   Pregnancy 03/05/2016   No leakage of amniotic fluid into vagina 12/14/2015     Maternal Medical History:   Past Medical History:  Diagnosis Date   Anemia    Depression    Trichomoniasis     Past Surgical History:  Procedure Laterality Date   NO PAST SURGERIES      Allergies  Allergen Reactions   Hydrocodone Rash   Tramadol Rash    Prior to Admission medications   Medication Sig Start Date End Date Taking? Authorizing Provider  Prenatal Vit-Fe Fumarate-FA (PRENATAL MULTIVITAMIN) TABS tablet Take 1 tablet by mouth daily at 12 noon.    [provider]     Prenatal care site:  [facility]  OB History  Gravida Para Term Preterm AB Living   5 4 4  0 0 4  SAB IAB Ectopic Multiple Live Births  0 0 0 0 2    # Outcome Date GA Lbr Len/2nd Weight Sex Delivery Anes PTL Lv  5 Current           4 Term 04/01/18 [redacted]w[redacted]d / 00:03 3170 g M Vag-Spont None  LIV     Name: Victoria Villarreal     Apgar1: 8  Apgar5: 9  3 Term 03/05/16 [redacted]w[redacted]d / 00:07 3317 g M Vag-Spont Local, EPI  LIV     Name: Victoria Villarreal,Victoria Villarreal     Apgar1: 8  Apgar5: 9  2 Term           1 Term              Social History: She  reports that she has been smoking cigarettes. She has been smoking an average of .25 packs per day. She has never used smokeless tobacco. She reports that she does not currently use alcohol after a past usage of about 1.0 standard drink of alcohol per week. She reports current drug use. Drug: Marijuana.  Family History: family history is not on file.   Review of Systems: A full review of systems was performed and negative except as noted in the HPI.     Physical Exam:  Vital Signs: There were no vitals taken for this visit. Physical Exam Constitutional:      Appearance: Normal appearance.  HENT:     Head: Normocephalic.  Cardiovascular:     Rate and Rhythm: Normal rate.     Pulses: Normal pulses.     Heart sounds: Normal heart sounds.  Pulmonary:     Effort: Pulmonary effort is normal.     Breath sounds: Normal breath sounds.  Abdominal:     Palpations: Abdomen is soft.  Musculoskeletal:     Cervical back: Normal range of motion and neck supple.  Skin:    General: Skin is warm and dry.  Neurological:     Mental Status: She is alert.  Psychiatric:        Mood and Affect: Mood normal.        Behavior: Behavior normal.        Thought Content: Thought content normal.        Judgment: Judgment normal.     General: no acute distress.  HEENT: normocephalic, atraumatic Heart: regular rate & rhythm.  No murmurs/rubs/gallops Lungs: clear to auscultation bilaterally, normal respiratory effort Abdomen: soft, gravid, non-tender;  EFW: 6  lbs Pelvic:   External: Normal external female genitalia  Cervix: Dilation: 2 / Effacement (%): 90 / Station: -1, -2    Extremities: non-tender, symmetric, no edema bilaterally.  DTRs: +2  Neurologic: Alert & oriented x 3.    Results for orders placed or performed during the hospital encounter of 04/23/22 (from the past 24 hour(s))  CBC     Status: Abnormal   Collection Time: 04/23/22 12:57 AM  Result Value Ref Range   WBC 10.2 4.0 - 10.5 K/uL   RBC 4.02 3.87 - 5.11 MIL/uL   Hemoglobin 11.0 (L) 12.0 - 15.0 g/dL   HCT 27.0 (L) 35.0 - 09.3 %   MCV 85.8 80.0 - 100.0 fL   MCH 27.4 26.0 - 34.0 pg   MCHC 31.9 30.0 - 36.0 g/dL   RDW 81.8 29.9 - 37.1 %   Platelets 333 150 - 400 K/uL   nRBC 0.0 0.0 - 0.2 %  Type and screen [facility]     Status: None (Preliminary result)   Collection Time: 04/23/22 12:57 AM  Result Value Ref Range   ABO/RH(D) PENDING    Antibody Screen PENDING    Sample Expiration      04/26/2022,2359 Performed at [facility], [address] [place], [place] [address]   Comprehensive metabolic panel     Status: Abnormal   Collection Time: 04/23/22 12:57 AM  Result Value Ref Range   Sodium 136 135 - 145 mmol/L   Potassium 3.7 3.5 - 5.1 mmol/L   Chloride 108 98 - 111 mmol/L   CO2 19 (L) 22 - 32 mmol/L   Glucose, Bld 104 (H) 70 - 99 mg/dL   BUN <5 (L) 6 - 20 mg/dL   Creatinine, Ser 9.38 0.44 - 1.00 mg/dL   Calcium 8.7 (L) 8.9 - 10.3 mg/dL   Total Protein 7.0 6.5 - 8.1 g/dL   Albumin 3.1 (L) 3.5 - 5.0 g/dL   AST 18 15 - 41 U/L   ALT 9 0 - 44 U/L   Alkaline Phosphatase 155 (H) 38 - 126 U/L   Total Bilirubin 0.6 0.3 - 1.2 mg/dL   GFR, Estimated >10 >17 mL/min   Anion gap 9 5 - 15    Pertinent Results:  Prenatal Labs: Blood type/Rh O Pos  Antibody screen neg  Rubella Immune  Varicella Immune  RPR NR  HBsAg Neg  Hep C NR   HIV NR  GC neg  Chlamydia  neg  Genetic screening cfDNA negative   1 hour GTT 148  3 hour GTT Did  not complete  GBS Pos   FHT:  FHR: 125 bpm, variability: moderate,  accelerations:  Present,  decelerations:  Present early deceleration, tracing maternal at times Category/reactivity:  Category I UC:   regular, every 2 minutes   Cephalic by [place] and SVE   No results found.  Assessment:  Victoria Villarreal is a 26 y.o. [id number] female at [redacted]w[redacted]d with GHTN, GBS  positive, hx of preterm delivery,depression, anemia and tobacco use in pregnancy.   Plan:  1. Admit to Labor & Delivery - consents reviewed and obtained  2. Fetal Well being  - Fetal Tracing: category 2 - Group B Streptococcus ppx  indicated: GBS pos - Presentation:VTX confirmed by SVE  3. Routine OB: - Prenatal labs reviewed, as above - Rh pos - CBC, T&S, RPR on admit - Clear liquid diet , continuous IV fluids  4. Monitoring of labor  - Contractions monitored with external toco - Pelvis proven to 7 lbs  - Plan for expectant management  - Augmentation with AROM as appropriate  - Plan for  continuous fetal monitoring - Maternal pain control as desired; planning regional anesthesia - Anticipate vaginal delivery  5. Post Partum Planning: - Infant feeding: pumping - Contraception: Liletta - Tdap vaccine: -did not get - Flu vaccine: N/A  Audric Venn Wonda Amis, CNM 04/23/22 1:32 AM  Chari Manning, CNM Certified Nurse Midwife [place]  [facility]

## 2022-04-23 NOTE — Anesthesia Preprocedure Evaluation (Signed)
Anesthesia Evaluation  Patient identified by MRN, date of birth, ID band Patient awake    Reviewed: Allergy & Precautions, NPO status , Patient's Chart, lab work & pertinent test results  History of Anesthesia Complications Negative for: history of anesthetic complications  Airway Mallampati: III  TM Distance: >3 FB Neck ROM: full    Dental  (+) Chipped   Pulmonary Current Smoker and Patient abstained from smoking.,    Pulmonary exam normal        Cardiovascular Exercise Tolerance: Good (-) hypertensionnegative cardio ROS Normal cardiovascular exam     Neuro/Psych Depression    GI/Hepatic negative GI ROS, neg GERD  ,  Endo/Other    Renal/GU   negative genitourinary   Musculoskeletal   Abdominal   Peds  Hematology negative hematology ROS (+)   Anesthesia Other Findings Past Medical History: No date: Anemia No date: Depression No date: Trichomoniasis  Past Surgical History: No date: NO PAST SURGERIES     Reproductive/Obstetrics (+) Pregnancy                             Anesthesia Physical Anesthesia Plan  ASA: 2  Anesthesia Plan: Epidural   Post-op Pain Management:    Induction:   PONV Risk Score and Plan:   Airway Management Planned: Natural Airway  Additional Equipment:   Intra-op Plan:   Post-operative Plan:   Informed Consent: I have reviewed the patients History and Physical, chart, labs and discussed the procedure including the risks, benefits and alternatives for the proposed anesthesia with the patient or authorized representative who has indicated his/her understanding and acceptance.     Dental Advisory Given  Plan Discussed with: Anesthesiologist  Anesthesia Plan Comments: (Patient reports no bleeding problems and no anticoagulant use.   Patient consented for risks of anesthesia including but not limited to:  - adverse reactions to medications -  risk of bleeding, infection and or nerve damage from epidural that could lead to paralysis - risk of headache or failed epidural - nerve damage due to positioning - that if epidural is used for C-section that there is a chance of epidural failure requiring spinal placement or conversion to GA - Damage to heart, brain, lungs, other parts of body or loss of life  Patient voiced understanding.)        Anesthesia Quick Evaluation

## 2022-04-23 NOTE — Anesthesia Procedure Notes (Signed)
Epidural Patient location during procedure: OB Start time: 04/23/2022 1:36 AM End time: 04/23/2022 1:41 AM  Staffing Anesthesiologist: Nelle Sayed, Cleda Mccreedy, MD Performed: anesthesiologist   Preanesthetic Checklist Completed: patient identified, IV checked, site marked, risks and benefits discussed, surgical consent, monitors and equipment checked, pre-op evaluation and timeout performed  Epidural Patient position: sitting Prep: ChloraPrep Patient monitoring: heart rate, continuous pulse ox and blood pressure Approach: midline Location: L3-L4 Injection technique: LOR saline  Needle:  Needle type: Tuohy  Needle gauge: 17 G Needle length: 9 cm and 9 Needle insertion depth: 7 cm Catheter type: closed end flexible Catheter size: 19 Gauge Catheter at skin depth: 12 cm Test dose: negative and 1.5% lidocaine with Epi 1:200 K  Assessment Sensory level: T10 Events: blood not aspirated, injection not painful, no injection resistance, no paresthesia and negative IV test  Additional Notes 1 attempt Pt. Evaluated and documentation done after procedure finished. Patient identified. Risks/Benefits/Options discussed with patient including but not limited to bleeding, infection, nerve damage, paralysis, failed block, incomplete pain control, headache, blood pressure changes, nausea, vomiting, reactions to medication both or allergic, itching and postpartum back pain. Confirmed with bedside nurse the patient's most recent platelet count. Confirmed with patient that they are not currently taking any anticoagulation, have any bleeding history or any family history of bleeding disorders. Patient expressed understanding and wished to proceed. All questions were answered. Sterile technique was used throughout the entire procedure. Please see nursing notes for vital signs. Test dose was given through epidural catheter and negative prior to continuing to dose epidural or start infusion. Warning signs of  high block given to the patient including shortness of breath, tingling/numbness in hands, complete motor block, or any concerning symptoms with instructions to call for help. Patient was given instructions on fall risk and not to get out of bed. All questions and concerns addressed with instructions to call with any issues or inadequate analgesia.    Patient tolerated the insertion well without immediate complications.Reason for block:procedure for pain

## 2022-04-23 NOTE — Discharge Summary (Signed)
Obstetrical Discharge Summary  Patient Name: Victoria Villarreal DOB: [birth year] MRN: [id number]  Date of Admission: 04/23/2022 Date of Delivery: 04/23/22 Delivered by: Chari Manning, CNM  Date of Discharge: 04/24/2022  Primary OB: [facility] LMP:No LMP recorded. EDC Estimated Date of Delivery: 05/01/22 Gestational Age at Delivery: [redacted]w[redacted]d   Antepartum complications:  GHTN Elevated 1 hour gtt, normal 3 hr gtt Anemia Hx of preterm labor at 36 weeks Trichomonas pos on 11/18/21, TOC 12/16/21- neg GBS bacteruria  Tobacco smoker in pregnancy Hx of depression Hx of marijuana use  Admitting Diagnosis: Normal labor and delivery [O80]  Secondary Diagnosis: Patient Active Problem List   Diagnosis Date Noted   Normal labor and delivery 04/23/2022   Gestational hypertension 04/20/2022   Elevated glucose tolerance test 04/20/2022   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 04/20/2022   Tobacco smoking affecting pregnancy in third trimester 11/18/2021   History of preterm delivery, currently pregnant in second trimester 11/18/2021   Supervision of high risk pregnancy in third trimester 11/10/2021    Augmentation: N/A Complications: None Intrapartum complications/course: Victoria Villarreal presented to L&D with painful uterine contractions . She was 7/80/-1. She progressed  to C/C/+2 with a spontaneous urge to push.  She pushed  effectively over approximately 3 minutes for a spontaneous vaginal birth.  Delivery Type: spontaneous vaginal delivery Anesthesia: epidural Placenta: spontaneous Laceration: none Episiotomy: none Newborn Data: Live born female  Birth Weight: 8lbs 0.4oz, 3640g APGAR: 8, 9   Newborn Delivery   Birth date/time: 04/23/2022 01:48:00 Delivery type: Vaginal, Spontaneous    Postpartum Procedures: None Edinburgh:     04/24/2022    9:25 AM  [name] Postnatal Depression Scale Screening Tool  I have been able to laugh and see the funny side of things. 0  I have  looked forward with enjoyment to things. 0  I have blamed myself unnecessarily when things went wrong. 1  I have been anxious or worried for no good reason. 1  I have felt scared or panicky for no good reason. 0  Things have been getting on top of me. 0  I have been so unhappy that I have had difficulty sleeping. 0  I have felt sad or miserable. 1  I have been so unhappy that I have been crying. 0  The thought of harming myself has occurred to me. 0  Edinburgh Postnatal Depression Scale Total 3     Post partum course:  Patient had a postpartum course complicated by gestational hypertension.  Her blood pressures were controlled with oral antihypertensives. By time of discharge on PPD#1, her pain was controlled on oral pain medications; she had appropriate lochia and was ambulating, voiding without difficulty and tolerating regular diet.  She was deemed stable for discharge to home.    Discharge Physical Exam:  BP 129/78 (BP Location: Right Arm)   Pulse 81   Temp 98.4 F (36.9 C)   Resp 18   SpO2 100%   Breastfeeding Unknown   General: NAD CV: RRR Pulm: CTABL, nl effort ABD: s/nd/nt, fundus firm and below the umbilicus Lochia: moderate Perineum:minimal edema/intact  DVT Evaluation: LE non-ttp, no evidence of DVT on exam.  Hemoglobin  Date Value Ref Range Status  04/24/2022 10.2 (L) 12.0 - 15.0 g/dL Final   HGB  Date Value Ref Range Status  06/09/2014 12.6 12.0 - 16.0 g/dL Final   HCT  Date Value Ref Range Status  04/24/2022 32.4 (L) 36.0 - 46.0 % Final  06/09/2014 39.5 35.0 - 47.0 %  Final     Disposition: stable, discharge to home. Baby Feeding: breastmilk and formula Baby Disposition: home with mom  Rh Immune globulin given: N/A Rubella vaccine given: N/A Varivax vaccine given: N/A Flu vaccine given in AP or PP setting: N/A Tdap vaccine given in AP or PP setting: did not receive at [facility]  Contraception: Liletta  Prenatal Labs:  Blood type/Rh O Pos  Antibody  screen neg  Rubella Immune  Varicella Immune  RPR NR  HBsAg Neg  Hep C NR   HIV NR  GC neg  Chlamydia neg  Genetic screening cfDNA negative   1 hour GTT 148  3 hour GTT Did not complete  GBS Pos    Plan:  Victoria Villarreal was discharged to home in good condition. Follow-up appointment with [facility] ion 04/28/2022 for blood pressure check delivering provider in 6 weeks.  Discharge Medications: Allergies as of 04/24/2022       Reactions   Hydrocodone Rash   Tramadol Rash        Medication List     TAKE these medications    acetaminophen 325 MG tablet Commonly known as: Tylenol Take 2 tablets (650 mg total) by mouth every 4 (four) hours as needed (for pain scale < 4).   ibuprofen 600 MG tablet Commonly known as: ADVIL Take 1 tablet (600 mg total) by mouth every 6 (six) hours as needed for mild pain or cramping.   NIFEdipine 30 MG 24 hr tablet Commonly known as: ADALAT CC Take 1 tablet (30 mg total) by mouth daily. Start taking on: April 25, 2022   prenatal multivitamin Tabs tablet Take 1 tablet by mouth daily at 12 noon.         Follow-up Information     Chari Manning Rolla Plate, CNM. Schedule an appointment as soon as possible for a visit in 6 week(s).   Specialty: Obstetrics Why: Please call to schedule 6 week postpartum visit and IUD insertion Contact information: [address] [place] [address] [phone]         Janyce Llanos, CNM. Go on 04/28/2022.   Specialty: Certified Nurse Midwife Why: at 2:45 PM for blood pressure check Contact information: [address] [place] [address] [phone]                 Signed:  Margaretmary Eddy, CNM Certified Nurse Midwife [facility]

## 2022-04-23 NOTE — OB Triage Note (Signed)
Pt Victoria Villarreal 26 y.o. presents to labor and delivery triage reporting contractions every 1-2 minutes. Pt is a [id number] at [redacted]w[redacted]d . Pt denies signs and symptoms consistent with rupture of membranes or active vaginal bleeding. Pt states positive fetal movement. External FM and TOCO applied to non-tender abdomen and assessing. Initial FHR 130. Vital signs obtained and within normal limits except elevated BP. Provider notified of pt.

## 2022-04-24 ENCOUNTER — Encounter: Payer: Self-pay | Admitting: Obstetrics and Gynecology

## 2022-04-24 LAB — CBC
HCT: 32.4 % — ABNORMAL LOW (ref 36.0–46.0)
Hemoglobin: 10.2 g/dL — ABNORMAL LOW (ref 12.0–15.0)
MCH: 27.1 pg (ref 26.0–34.0)
MCHC: 31.5 g/dL (ref 30.0–36.0)
MCV: 86.2 fL (ref 80.0–100.0)
Platelets: 301 10*3/uL (ref 150–400)
RBC: 3.76 MIL/uL — ABNORMAL LOW (ref 3.87–5.11)
RDW: 14.2 % (ref 11.5–15.5)
WBC: 8.3 10*3/uL (ref 4.0–10.5)
nRBC: 0 % (ref 0.0–0.2)

## 2022-04-24 MED ORDER — ACETAMINOPHEN 325 MG PO TABS
650.0000 mg | ORAL_TABLET | ORAL | Status: DC | PRN
Start: 2022-04-24 — End: 2022-06-23

## 2022-04-24 MED ORDER — NIFEDIPINE ER 30 MG PO TB24: 30.0000 mg | ORAL_TABLET | Freq: Every day | ORAL | 3 refills | Status: DC

## 2022-04-24 MED ORDER — IBUPROFEN 600 MG PO TABS: 600.0000 mg | ORAL_TABLET | Freq: Four times a day (QID) | ORAL | 0 refills | Status: DC | PRN

## 2022-04-24 MED ORDER — NIFEDIPINE ER OSMOTIC RELEASE 30 MG PO TB24
30.0000 mg | ORAL_TABLET | Freq: Every day | ORAL | Status: DC
Start: 2022-04-24 — End: 2022-04-24
  Administered 2022-04-24: 30 mg via ORAL
  Filled 2022-04-24: qty 1

## 2022-04-24 NOTE — Discharge Instructions (Signed)
Vaginal Delivery, Care After Refer to this sheet in the next few weeks. These discharge instructions provide you with information on caring for yourself after delivery. Your caregiver may also give you specific instructions. Your treatment has been planned according to the most current medical practices available, but problems sometimes occur. Call your caregiver if you have any problems or questions after you go home. HOME CARE INSTRUCTIONS Take over-the-counter or prescription medicines only as directed by your caregiver or pharmacist. Do not drink alcohol, especially if you are breastfeeding or taking medicine to relieve pain. Do not smoke tobacco. Continue to use good perineal care. Good perineal care includes: Wiping your perineum from back to front Keeping your perineum clean. You can do sitz baths twice a day, to help keep this area clean Do not use tampons, douche or have sex until your caregiver says it is okay. Shower only and avoid sitting in submerged water, aside from sitz baths Wear a well-fitting bra that provides breast support. Eat healthy foods. Drink enough fluids to keep your urine clear or pale yellow. Eat high-fiber foods such as whole grain cereals and breads, brown rice, beans, and fresh fruits and vegetables every day. These foods may help prevent or relieve constipation. Avoid constipation with high fiber foods or medications, such as miralax or metamucil Follow your caregiver's recommendations regarding resumption of activities such as climbing stairs, driving, lifting, exercising, or traveling. Talk to your caregiver about resuming sexual activities. Resumption of sexual activities is dependent upon your risk of infection, your rate of healing, and your comfort and desire to resume sexual activity. Try to have someone help you with your household activities and your newborn for at least a few days after you leave the hospital. Rest as much as possible. Try to rest or  take a nap when your newborn is sleeping. Increase your activities gradually. Keep all of your scheduled postpartum appointments. It is very important to keep your scheduled follow-up appointments. At these appointments, your caregiver will be checking to make sure that you are healing physically and emotionally. SEEK MEDICAL CARE IF:  You are passing large clots from your vagina. Save any clots to show your caregiver. You have a foul smelling discharge from your vagina. You have trouble urinating. You are urinating frequently. You have pain when you urinate. You have a change in your bowel movements. You have increasing redness, pain, or swelling near your vaginal incision (episiotomy) or vaginal tear. You have pus draining from your episiotomy or vaginal tear. Your episiotomy or vaginal tear is separating. You have painful, hard, or reddened breasts. You have a severe headache. You have blurred vision or see spots. You feel sad or depressed. You have thoughts of hurting yourself or your newborn. You have questions about your care, the care of your newborn, or medicines. You are dizzy or light-headed. You have a rash. You have nausea or vomiting. You were breastfeeding and have not had a menstrual period within 12 weeks after you stopped breastfeeding. You are not breastfeeding and have not had a menstrual period by the 12th week after delivery. You have a fever. SEEK IMMEDIATE MEDICAL CARE IF:  You have persistent pain. You have chest pain. You have shortness of breath. You faint. You have leg pain. You have stomach pain. Your vaginal bleeding saturates two or more sanitary pads in 1 hour. MAKE SURE YOU:  Understand these instructions. Will watch your condition. Will get help right away if you are not doing well or   get worse. Document Released: 09/25/2000 Document Revised: 02/12/2014 Document Reviewed: 05/25/2012 ExitCare Patient Information 2015 [employer]. This  information is not intended to replace advice given to you by your health care provider. Make sure you discuss any questions you have with your health care provider.  Sitz Bath A sitz bath is a warm water bath taken in the sitting position. The water covers only the hips and butt (buttocks). We recommend using one that fits in the toilet, to help with ease of use and cleanliness. It may be used for either healing or cleaning purposes. Sitz baths are also used to relieve pain, itching, or muscle tightening (spasms). The water may contain medicine. Moist heat will help you heal and relax.  HOME CARE  Take 3 to 4 sitz baths a day. Fill the bathtub half-full with warm water. Sit in the water and open the drain a little. Turn on the warm water to keep the tub half-full. Keep the water running constantly. Soak in the water for 15 to 20 minutes. After the sitz bath, pat the affected area dry. GET HELP RIGHT AWAY IF: You get worse instead of better. Stop the sitz baths if you get worse. MAKE SURE YOU: Understand these instructions. Will watch your condition. Will get help right away if you are not doing well or get worse. Document Released: 11/05/2004 Document Revised: 06/22/2012 Document Reviewed: 01/26/2011 ExitCare Patient Information 2015 [employer]. This information is not intended to replace advice given to you by your health care provider. Make sure you discuss any questions you have with your health care provider.   

## 2022-04-24 NOTE — Progress Notes (Signed)
Pt discharged with infant. Discharge instructions, prescriptions, and follow up appointments given to and reviewed with patient. Pt verbalized understanding. Escorted out by RN.  

## 2022-04-24 NOTE — Progress Notes (Signed)
Postpartum Day  1  Subjective: no complaints, up ad lib, voiding, and tolerating PO  Doing well, no concerns. Ambulating without difficulty, pain managed with PO meds, tolerating regular diet, and voiding without difficulty.   No fever/chills, chest pain, shortness of breath, nausea/vomiting, or leg pain. No nipple or breast pain. No headache, visual changes, or RUQ/epigastric pain.  Objective: BP 139/89 (BP Location: Left Arm)   Pulse 74   Temp 98.1 F (36.7 C) (Oral)   Resp 20   SpO2 100%   Breastfeeding Unknown  Vitals:   04/23/22 0200 04/23/22 0205 04/23/22 0210 04/23/22 0220  BP: 133/82 133/73 105/89 135/78   04/23/22 0235 04/23/22 0240 04/23/22 0814 04/23/22 1110  BP: 127/79 (!) 128/99 126/84 129/83   04/23/22 1558 04/23/22 2115 04/23/22 2333 04/24/22 0854  BP: 140/79 (!) 146/72 137/85 139/89      Physical Exam:  General: alert, cooperative, and no distress Breasts: soft/nontender CV: RRR Pulm: nl effort, CTABL Abdomen: soft, non-tender, active bowel sounds Uterine Fundus: firm Perineum: minimal edema, intact Lochia: appropriate DVT Evaluation: No evidence of DVT seen on physical exam.     Latest Ref Rng & Units 04/24/2022    8:39 AM 04/23/2022    6:09 AM 04/23/2022   12:57 AM  CBC  WBC 4.0 - 10.5 K/uL 8.3  12.2  10.2   Hemoglobin 12.0 - 15.0 g/dL 55.9  74.1  63.8   Hematocrit 36.0 - 46.0 % 32.4  32.3  34.5   Platelets 150 - 400 K/uL 301  304  333      Assessment/Plan: 26 y.o. [id number] postpartum day # 1  -Continue routine postpartum care -Lactation consult PRN for breastfeeding, currently pumping to express breast milk -Acute blood loss anemia - hemodynamically stable and asymptomatic; start PO ferrous sulfate BID with stool softeners  -Immunization status:   all immunizations up to date -Several mild range blood pressures noted meeting criteria for gestational hypertension.  Denies s/s of preeclampsia and no severe range BP's are noted.  -Procardia  ordered to start this morning.  -Will plan to monitor BP this morning and afternoon.  She was GBS pos and not adequately treated d/t precipitous labor.  Will discuss with pediatrician about discharge timing this evening versus tomorrow.    Disposition: Continue inpatient postpartum care. Desires discharge home today. Will collaborate with Peds for discharge timing for this evening versus tomorrow morning.    LOS: 1 day   Gustavo Lah, [place] 04/24/2022, 9:07 AM   ----- Margaretmary Eddy  Certified Nurse Midwife [facility]

## 2022-04-24 NOTE — Anesthesia Postprocedure Evaluation (Signed)
Anesthesia Post Note  Patient: Victoria Villarreal  Procedure(s) Performed: AN AD HOC LABOR EPIDURAL  Patient location during evaluation: Mother Baby Anesthesia Type: Epidural Level of consciousness: awake and alert Pain management: pain level controlled Vital Signs Assessment: post-procedure vital signs reviewed and stable Respiratory status: spontaneous breathing, nonlabored ventilation and respiratory function stable Cardiovascular status: stable Postop Assessment: no headache, no backache and epidural receding Anesthetic complications: no   No notable events documented.   Last Vitals:  Vitals:   04/23/22 2333 04/24/22 0854  BP: 137/85 139/89  Pulse: 72 74  Resp: 20 20  Temp: 36.6 C 36.7 C  SpO2: 100% 100%    Last Pain:  Vitals:   04/24/22 0907  TempSrc:   PainSc: 6                  Rosanne Gutting

## 2022-06-21 ENCOUNTER — Encounter: Payer: Self-pay | Admitting: Emergency Medicine

## 2022-06-21 ENCOUNTER — Other Ambulatory Visit: Payer: Self-pay

## 2022-06-21 ENCOUNTER — Emergency Department: Payer: Medicaid Other

## 2022-06-21 ENCOUNTER — Observation Stay: Payer: Medicaid Other

## 2022-06-21 ENCOUNTER — Inpatient Hospital Stay
Admission: EM | Admit: 2022-06-21 | Discharge: 2022-06-23 | DRG: 690 | Disposition: A | Discharge status: Home / Self Care | Payer: Medicaid Other | Attending: Internal Medicine | Admitting: Internal Medicine

## 2022-06-21 DIAGNOSIS — B9620 Unspecified Escherichia coli [E. coli] as the cause of diseases classified elsewhere: Secondary | ICD-10-CM | POA: Diagnosis present

## 2022-06-21 DIAGNOSIS — O139 Gestational [pregnancy-induced] hypertension without significant proteinuria, unspecified trimester: Secondary | ICD-10-CM | POA: Diagnosis present

## 2022-06-21 DIAGNOSIS — N10 Acute tubulo-interstitial nephritis: Principal | ICD-10-CM | POA: Diagnosis present

## 2022-06-21 DIAGNOSIS — Z20822 Contact with and (suspected) exposure to covid-19: Secondary | ICD-10-CM | POA: Diagnosis present

## 2022-06-21 DIAGNOSIS — N12 Tubulo-interstitial nephritis, not specified as acute or chronic: Secondary | ICD-10-CM | POA: Diagnosis present

## 2022-06-21 DIAGNOSIS — N111 Chronic obstructive pyelonephritis: Secondary | ICD-10-CM | POA: Diagnosis present

## 2022-06-21 DIAGNOSIS — F419 Anxiety disorder, unspecified: Secondary | ICD-10-CM | POA: Diagnosis not present

## 2022-06-21 DIAGNOSIS — R7401 Elevation of levels of liver transaminase levels: Secondary | ICD-10-CM | POA: Diagnosis present

## 2022-06-21 DIAGNOSIS — K760 Fatty (change of) liver, not elsewhere classified: Secondary | ICD-10-CM | POA: Diagnosis present

## 2022-06-21 DIAGNOSIS — R7309 Other abnormal glucose: Secondary | ICD-10-CM | POA: Diagnosis not present

## 2022-06-21 DIAGNOSIS — Z885 Allergy status to narcotic agent status: Secondary | ICD-10-CM

## 2022-06-21 DIAGNOSIS — R1011 Right upper quadrant pain: Secondary | ICD-10-CM

## 2022-06-21 DIAGNOSIS — R109 Unspecified abdominal pain: Secondary | ICD-10-CM | POA: Diagnosis present

## 2022-06-21 DIAGNOSIS — F17210 Nicotine dependence, cigarettes, uncomplicated: Secondary | ICD-10-CM | POA: Diagnosis present

## 2022-06-21 LAB — URINALYSIS, ROUTINE W REFLEX MICROSCOPIC
Bilirubin Urine: NEGATIVE
Glucose, UA: NEGATIVE mg/dL
Hgb urine dipstick: NEGATIVE
Ketones, ur: NEGATIVE mg/dL
Leukocytes,Ua: NEGATIVE
Nitrite: POSITIVE — AB
Protein, ur: 30 mg/dL — AB
Specific Gravity, Urine: >1.046 — ABNORMAL HIGH (ref 1.005–1.030)
pH: 7 (ref 5.0–8.0)

## 2022-06-21 LAB — CBC
HCT: 38.2 % (ref 36.0–46.0)
Hemoglobin: 12.2 g/dL (ref 12.0–15.0)
MCH: 26.3 pg (ref 26.0–34.0)
MCHC: 31.9 g/dL (ref 30.0–36.0)
MCV: 82.3 fL (ref 80.0–100.0)
Platelets: 359 10*3/uL (ref 150–400)
RBC: 4.64 MIL/uL (ref 3.87–5.11)
RDW: 16.8 % — ABNORMAL HIGH (ref 11.5–15.5)
WBC: 7.9 10*3/uL (ref 4.0–10.5)
nRBC: 0 % (ref 0.0–0.2)

## 2022-06-21 LAB — COMPREHENSIVE METABOLIC PANEL
ALT: 345 U/L — ABNORMAL HIGH (ref 0–44)
AST: 638 U/L — ABNORMAL HIGH (ref 15–41)
Albumin: 3.8 g/dL (ref 3.5–5.0)
Alkaline Phosphatase: 224 U/L — ABNORMAL HIGH (ref 38–126)
Anion gap: 10 (ref 5–15)
BUN: 6 mg/dL (ref 6–20)
CO2: 25 mmol/L (ref 22–32)
Calcium: 9 mg/dL (ref 8.9–10.3)
Chloride: 106 mmol/L (ref 98–111)
Creatinine, Ser: 0.7 mg/dL (ref 0.44–1.00)
GFR, Estimated: >60 mL/min (ref >60)
Glucose, Bld: 154 mg/dL — ABNORMAL HIGH (ref 70–99)
Potassium: 2.9 mmol/L — ABNORMAL LOW (ref 3.5–5.1)
Sodium: 141 mmol/L (ref 135–145)
Total Bilirubin: 1 mg/dL (ref 0.3–1.2)
Total Protein: 7.8 g/dL (ref 6.5–8.1)

## 2022-06-21 LAB — POC URINE PREG, ED: Preg Test, Ur: NEGATIVE

## 2022-06-21 LAB — ACETAMINOPHEN LEVEL: Acetaminophen (Tylenol), Serum: <10 ug/mL — ABNORMAL LOW (ref 10–30)

## 2022-06-21 LAB — ETHANOL: Alcohol, Ethyl (B): <10 mg/dL (ref <10)

## 2022-06-21 LAB — SARS CORONAVIRUS 2 BY RT PCR: SARS Coronavirus 2 by RT PCR: NEGATIVE

## 2022-06-21 LAB — LIPASE, BLOOD: Lipase: 29 U/L (ref 11–51)

## 2022-06-21 MED ORDER — ONDANSETRON HCL 4 MG/2ML IJ SOLN: 4.0000 mg | Freq: Four times a day (QID) | INTRAMUSCULAR | Status: DC | PRN

## 2022-06-21 MED ORDER — HYDROMORPHONE HCL 1 MG/ML IJ SOLN
1.0000 mg | Freq: Once | INTRAMUSCULAR | Status: AC
  Administered 2022-06-21: 1 mg via INTRAVENOUS
  Filled 2022-06-21: qty 1

## 2022-06-21 MED ORDER — POTASSIUM CHLORIDE IN NACL 20-0.9 MEQ/L-% IV SOLN
Freq: Once | INTRAVENOUS | Status: AC
  Filled 2022-06-21: qty 1000

## 2022-06-21 MED ORDER — KETOROLAC TROMETHAMINE 30 MG/ML IJ SOLN
30.0000 mg | Freq: Once | INTRAMUSCULAR | Status: AC
  Administered 2022-06-21: 30 mg via INTRAVENOUS
  Filled 2022-06-21: qty 1

## 2022-06-21 MED ORDER — ACETAMINOPHEN 325 MG PO TABS: 650.0000 mg | ORAL_TABLET | Freq: Four times a day (QID) | ORAL | Status: DC | PRN

## 2022-06-21 MED ORDER — ONDANSETRON HCL 4 MG PO TABS: 4.0000 mg | ORAL_TABLET | Freq: Four times a day (QID) | ORAL | Status: DC | PRN

## 2022-06-21 MED ORDER — GADOBUTROL 1 MMOL/ML IV SOLN
9.0000 mL | Freq: Once | INTRAVENOUS | Status: AC | PRN
  Administered 2022-06-21: 10 mL via INTRAVENOUS

## 2022-06-21 MED ORDER — NIFEDIPINE ER OSMOTIC RELEASE 30 MG PO TB24
30.0000 mg | ORAL_TABLET | Freq: Every day | ORAL | Status: DC | PRN
Start: 2022-06-21 — End: 2022-06-22

## 2022-06-21 MED ORDER — IOHEXOL 300 MG/ML  SOLN
100.0000 mL | Freq: Once | INTRAMUSCULAR | Status: AC | PRN
  Administered 2022-06-21: 100 mL via INTRAVENOUS

## 2022-06-21 MED ORDER — MORPHINE SULFATE (PF) 4 MG/ML IV SOLN
4.0000 mg | INTRAVENOUS | Status: AC | PRN
  Administered 2022-06-21 – 2022-06-23 (×5): 4 mg via INTRAVENOUS
  Filled 2022-06-21 (×5): qty 1

## 2022-06-21 MED ORDER — POTASSIUM CHLORIDE 10 MEQ/100ML IV SOLN: 10.0000 meq | INTRAVENOUS | Status: DC

## 2022-06-21 MED ORDER — SODIUM CHLORIDE 0.9 % IV SOLN
2.0000 g | INTRAVENOUS | Status: DC
  Administered 2022-06-21 – 2022-06-22 (×2): 2 g via INTRAVENOUS
  Filled 2022-06-21 (×2): qty 2

## 2022-06-21 MED ORDER — SODIUM CHLORIDE 0.9 % IV BOLUS
1000.0000 mL | Freq: Once | INTRAVENOUS | Status: AC
  Administered 2022-06-21: 1000 mL via INTRAVENOUS

## 2022-06-21 MED ORDER — HYDROMORPHONE HCL 1 MG/ML IJ SOLN
0.5000 mg | Freq: Once | INTRAMUSCULAR | Status: AC
  Administered 2022-06-21: 0.5 mg via INTRAVENOUS
  Filled 2022-06-21: qty 0.5

## 2022-06-21 MED ORDER — POTASSIUM CHLORIDE CRYS ER 20 MEQ PO TBCR: 40.0000 meq | EXTENDED_RELEASE_TABLET | Freq: Two times a day (BID) | ORAL | Status: DC

## 2022-06-21 MED ORDER — ONDANSETRON HCL 4 MG/2ML IJ SOLN
4.0000 mg | Freq: Once | INTRAMUSCULAR | Status: AC
  Administered 2022-06-21: 4 mg via INTRAVENOUS
  Filled 2022-06-21: qty 2

## 2022-06-21 MED ORDER — ORAL CARE MOUTH RINSE: 15.0000 mL | OROMUCOSAL | Status: DC | PRN

## 2022-06-21 MED ORDER — POTASSIUM CHLORIDE CRYS ER 20 MEQ PO TBCR
40.0000 meq | EXTENDED_RELEASE_TABLET | Freq: Two times a day (BID) | ORAL | Status: DC
  Administered 2022-06-21 – 2022-06-22 (×2): 40 meq via ORAL
  Filled 2022-06-21 (×2): qty 2

## 2022-06-21 MED ORDER — METRONIDAZOLE 500 MG/100ML IV SOLN
500.0000 mg | Freq: Two times a day (BID) | INTRAVENOUS | Status: DC
Start: 2022-06-21 — End: 2022-06-21

## 2022-06-21 MED ORDER — PIPERACILLIN-TAZOBACTAM 3.375 G IVPB 30 MIN
3.3750 g | Freq: Once | INTRAVENOUS | Status: AC
  Administered 2022-06-21: 3.375 g via INTRAVENOUS
  Filled 2022-06-21: qty 50

## 2022-06-21 MED ORDER — LORAZEPAM 2 MG/ML IJ SOLN
1.0000 mg | Freq: Once | INTRAMUSCULAR | Status: AC
  Administered 2022-06-21: 1 mg via INTRAVENOUS

## 2022-06-21 MED ORDER — HYDRALAZINE HCL 10 MG PO TABS: 10.0000 mg | ORAL_TABLET | Freq: Four times a day (QID) | ORAL | Status: DC | PRN

## 2022-06-21 MED ORDER — SENNOSIDES-DOCUSATE SODIUM 8.6-50 MG PO TABS: 1.0000 | ORAL_TABLET | Freq: Every evening | ORAL | Status: DC | PRN

## 2022-06-21 MED ORDER — ACETAMINOPHEN 650 MG RE SUPP
650.0000 mg | Freq: Four times a day (QID) | RECTAL | Status: DC | PRN
Start: 2022-06-21 — End: 2022-06-23

## 2022-06-21 MED ORDER — LORAZEPAM 2 MG/ML IJ SOLN
1.0000 mg | Freq: Once | INTRAMUSCULAR | Status: DC
  Filled 2022-06-21: qty 1

## 2022-06-21 MED ORDER — HEPARIN SODIUM (PORCINE) 5000 UNIT/ML IJ SOLN: 5000.0000 [IU] | Freq: Three times a day (TID) | INTRAMUSCULAR | Status: DC

## 2022-06-21 MED ORDER — HEPARIN SODIUM (PORCINE) 5000 UNIT/ML IJ SOLN
5000.0000 [IU] | Freq: Three times a day (TID) | INTRAMUSCULAR | Status: AC
  Administered 2022-06-21: 5000 [IU] via SUBCUTANEOUS
  Filled 2022-06-21: qty 1

## 2022-06-21 MED ORDER — PRENATAL MULTIVITAMIN CH
1.0000 | ORAL_TABLET | Freq: Every day | ORAL | Status: DC
  Administered 2022-06-22: 1 via ORAL
  Filled 2022-06-21 (×3): qty 1

## 2022-06-21 MED ORDER — HYDROMORPHONE HCL 1 MG/ML IJ SOLN
1.0000 mg | INTRAMUSCULAR | Status: AC | PRN
  Administered 2022-06-21 – 2022-06-22 (×4): 1 mg via INTRAVENOUS
  Filled 2022-06-21 (×5): qty 1

## 2022-06-21 MED ORDER — LORAZEPAM 2 MG/ML IJ SOLN: 0.5000 mg | Freq: Four times a day (QID) | INTRAMUSCULAR | Status: DC | PRN

## 2022-06-21 MED ORDER — SODIUM CHLORIDE 0.9 % IV SOLN: 2.0000 g | INTRAVENOUS | Status: DC

## 2022-06-21 MED ORDER — METRONIDAZOLE 500 MG/100ML IV SOLN
500.0000 mg | Freq: Two times a day (BID) | INTRAVENOUS | Status: DC
  Administered 2022-06-21 – 2022-06-23 (×4): 500 mg via INTRAVENOUS
  Filled 2022-06-21 (×4): qty 100

## 2022-06-21 MED ORDER — LABETALOL HCL 5 MG/ML IV SOLN: 5.0000 mg | INTRAVENOUS | Status: DC | PRN

## 2022-06-21 NOTE — ED Provider Notes (Addendum)
[facility] Provider Note   Event Date/Time   First MD Initiated Contact with Patient 06/21/22 [phone]     (approximate) History  Abdominal Pain, Nausea, and Emesis  HPI Victoria Villarreal is a 26 y.o. female with no stated past medical history presents for right upper quadrant midepigastric abdominal pain with associated nausea/vomiting.  Patient describes 10/10, nonradiating abdominal pain that began at 0300 last night and has been stable since onset. ROS: Patient currently denies any vision changes, tinnitus, difficulty speaking, facial droop, sore throat, chest pain, shortness of breath, dysuria, or weakness/numbness/paresthesias in any extremity   Physical Exam  Triage Vital Signs: ED Triage Vitals  Enc Vitals Group     BP 06/21/22 0856 (!) 151/83     Pulse Rate 06/21/22 0900 64     Resp 06/21/22 0856 20     Temp 06/21/22 0856 98.5 F (36.9 C)     Temp Source 06/21/22 0856 Oral     SpO2 06/21/22 0900 100 %     Weight 06/21/22 0854 213 lb 13.5 oz (97 kg)     Height 06/21/22 0854 5\' 7"  (1.702 m)     Head Circumference --      Peak Flow --      Pain Score 06/21/22 0854 10     Pain Loc --      Pain Edu? --      Excl. in GC? --    Most recent vital signs: Vitals:   06/21/22 1400 06/21/22 1430  BP: 127/74 125/79  Pulse: (!) 48 (!) 48  Resp:    Temp:    SpO2: 99% 99%   General: Awake, oriented x4. CV:  Good peripheral perfusion.  Resp:  Normal effort.  Abd:  No distention.  Epigastric and right upper quadrant tenderness to palpation Other:  Young adult African-American female laying in bed in moderate distress secondary to abdominal pain ED Results / Procedures / Treatments  Labs (all labs ordered are listed, but only abnormal results are displayed) Labs Reviewed  COMPREHENSIVE METABOLIC PANEL - Abnormal; Notable for the following components:      Result Value   Potassium 2.9 (*)    Glucose, Bld 154 (*)    AST 638 (*)    ALT 345 (*)    Alkaline  Phosphatase 224 (*)    All other components within normal limits  CBC - Abnormal; Notable for the following components:   RDW 16.8 (*)    All other components within normal limits  URINALYSIS, ROUTINE W REFLEX MICROSCOPIC - Abnormal; Notable for the following components:   Color, Urine AMBER (*)    APPearance CLEAR (*)    Specific Gravity, Urine >1.046 (*)    Protein, ur 30 (*)    Nitrite POSITIVE (*)    Bacteria, UA MANY (*)    All other components within normal limits  ACETAMINOPHEN LEVEL - Abnormal; Notable for the following components:   Acetaminophen (Tylenol), Serum <10 (*)    All other components within normal limits  URINE CULTURE  SARS CORONAVIRUS 2 BY RT PCR  LIPASE, BLOOD  ETHANOL  HIV ANTIBODY (ROUTINE TESTING W REFLEX)  POC URINE PREG, ED   EKG ED ECG REPORT I, 08/21/22, the attending physician, personally viewed and interpreted this ECG. Date: 06/21/2022 EKG Time: 0856 Rate: 65 Rhythm: normal sinus rhythm QRS Axis: normal Intervals: normal ST/T Wave abnormalities: normal Narrative Interpretation: no evidence of acute ischemia RADIOLOGY ED MD interpretation: Right upper quadrant  ultrasound shows cholelithiasis with mild gallbladder wall thickening concerning for possible cholecystitis.  The scan was interpreted by me  CT of the abdomen and pelvis with IV contrast interpreted by me and shows findings suspicious for acute pyelonephritis of the right kidney as well as mild gallbladder wall thickening -Agree with radiology assessment Official radiology report(s): US Abdomen Limited RUQ (LIVER/GB)  Result Date: 06/21/2022 CLINICAL DATA:  Transaminitis. Possible gallbladder wall thickening seen on CT performed earlier today. EXAM: ULTRASOUND ABDOMEN LIMITED RIGHT UPPER QUADRANT COMPARISON:  CT abdomen dated 06/21/2022. FINDINGS: Gallbladder: Multiple small gallstones, largest stone measured at 5 mm. Gallbladder wall is mildly thickened, measuring 4 mm. No  pericholecystic fluid seen. No sonographic Murphy's sign elicited during the exam. Common bile duct: Diameter: 5 mm Liver: No focal lesion identified. Within normal limits in parenchymal echogenicity. Portal vein is patent on color Doppler imaging with normal direction of blood flow towards the liver. Other: None. IMPRESSION: 1. Cholelithiasis. 2. Mild gallbladder wall thickening, measuring 4 mm. However, no pericholecystic fluid or sonographic Murphy's sign elicited to confirm an acute cholecystitis. Mild gallbladder wall thickening could indicate a chronic cholecystitis. If clinically needed, consider nuclear medicine HIDA scan to exclude early acute cholecystitis. 3. No bile duct dilatation. Electronically Signed   By: Bary Richard M.D.   On: 06/21/2022 13:07   CT Abdomen Pelvis W Contrast  Result Date: 06/21/2022 CLINICAL DATA:  Right upper quadrant pain, nausea EXAM: CT ABDOMEN AND PELVIS WITH CONTRAST TECHNIQUE: Multidetector CT imaging of the abdomen and pelvis was performed using the standard protocol following bolus administration of intravenous contrast. RADIATION DOSE REDUCTION: This exam was performed according to the departmental dose-optimization program which includes automated exposure control, adjustment of the mA and/or kV according to patient size and/or use of iterative reconstruction technique. CONTRAST:  OMNIPAQUE IOHEXOL 300 MG/ML  SOLN COMPARISON:  06/09/2014 FINDINGS: Lower chest: Dependent bibasilar atelectasis.  Normal heart size. Hepatobiliary: No focal liver abnormality is identified. Suggestion of mild gallbladder wall thickening. No hyperdense gallstone. No biliary dilatation. Pancreas: Unremarkable. No pancreatic ductal dilatation or surrounding inflammatory changes. Spleen: Normal in size without focal abnormality. Adrenals/Urinary Tract: Unremarkable adrenal glands. There is a wedge-shaped area of decreased enhancement within the lower pole of the right kidney with  adjacent perinephric fat stranding (series 2, image 43). The left kidney enhances normally. No focal lesion, stone, or hydronephrosis. Ureters are nondilated. Urinary bladder appears unremarkable. Stomach/Bowel: Stomach is within normal limits. Appendix appears normal (series 2, image 67). No evidence of bowel wall thickening, distention, or inflammatory changes. Vascular/Lymphatic: No significant vascular findings are present. No enlarged abdominal or pelvic lymph nodes. Reproductive: Anteverted uterus.  No adnexal masses. Other: No free fluid. No abdominopelvic fluid collection. No pneumoperitoneum. No abdominal wall hernia. Musculoskeletal: No acute or significant osseous findings. IMPRESSION: 1. Findings suspicious for acute pyelonephritis of the right kidney. Correlation with urinalysis is recommended. 2. Suggestion of mild gallbladder wall thickening. If there is clinical concern for acute cholecystitis, further evaluation with right upper quadrant ultrasound could be performed. Electronically Signed   By: Duanne Guess D.O.   On: 06/21/2022 11:09   PROCEDURES: Critical Care performed: Yes, see critical care procedure note(s) .1-3 Lead EKG Interpretation  Performed by: Merwyn Katos, MD Authorized by: Merwyn Katos, MD     Interpretation: abnormal     ECG rate:  49   ECG rate assessment: bradycardic     Rhythm: sinus bradycardia     Ectopy: none  Conduction: normal   CRITICAL CARE Performed by: Merwyn Katos  Total critical care time: 17 minutes  Critical care time was exclusive of separately billable procedures and treating other patients.  Critical care was necessary to treat or prevent imminent or life-threatening deterioration.  Critical care was time spent personally by me on the following activities: development of treatment plan with patient and/or surrogate as well as nursing, discussions with consultants, evaluation of patient's response to treatment, examination of  patient, obtaining history from patient or surrogate, ordering and performing treatments and interventions, ordering and review of laboratory studies, ordering and review of radiographic studies, pulse oximetry and re-evaluation of patient's condition.  MEDICATIONS ORDERED IN ED: Medications  prenatal multivitamin tablet 1 tablet (0 tablets Oral Hold 06/21/22 1410)  acetaminophen (TYLENOL) tablet 650 mg (has no administration in time range)    Or  acetaminophen (TYLENOL) suppository 650 mg (has no administration in time range)  ondansetron (ZOFRAN) tablet 4 mg (has no administration in time range)    Or  ondansetron (ZOFRAN) injection 4 mg (has no administration in time range)  senna-docusate (Senokot-S) tablet 1 tablet (has no administration in time range)  morphine (PF) 4 MG/ML injection 4 mg (has no administration in time range)  HYDROmorphone (DILAUDID) injection 1 mg (has no administration in time range)  cefTRIAXone (ROCEPHIN) 2 g in sodium chloride 0.9 % 100 mL IVPB (has no administration in time range)  metroNIDAZOLE (FLAGYL) IVPB 500 mg (has no administration in time range)  NIFEdipine (PROCARDIA-XL/NIFEDICAL-XL) 24 hr tablet 30 mg (has no administration in time range)  hydrALAZINE (APRESOLINE) tablet 10 mg (has no administration in time range)  potassium chloride SA (KLOR-CON M) CR tablet 40 mEq (0 mEq Oral Hold 06/21/22 1451)  LORazepam (ATIVAN) injection 0.5 mg (has no administration in time range)  heparin injection 5,000 Units (has no administration in time range)  sodium chloride 0.9 % bolus 1,000 mL (0 mLs Intravenous Stopped 06/21/22 0945)  ondansetron (ZOFRAN) injection 4 mg (4 mg Intravenous Given 06/21/22 0912)  HYDROmorphone (DILAUDID) injection 1 mg (1 mg Intravenous Given 06/21/22 0912)  ketorolac (TORADOL) 30 MG/ML injection 30 mg (30 mg Intravenous Given 06/21/22 0912)  0.9 % NaCl with KCl 20 mEq/ L  infusion ( Intravenous Stopped 06/21/22 1325)  iohexol (OMNIPAQUE) 300  MG/ML solution 100 mL (100 mLs Intravenous Contrast Given 06/21/22 1046)  HYDROmorphone (DILAUDID) injection 0.5 mg (0.5 mg Intravenous Given 06/21/22 1249)  piperacillin-tazobactam (ZOSYN) IVPB 3.375 g (0 g Intravenous Stopped 06/21/22 1438)  LORazepam (ATIVAN) injection 1 mg (1 mg Intravenous Given 06/21/22 1427)  gadobutrol (GADAVIST) 1 MMOL/ML injection 9 mL (10 mLs Intravenous Contrast Given 06/21/22 1514)   IMPRESSION / MDM / ASSESSMENT AND PLAN / ED COURSE  I reviewed the triage vital signs and the nursing notes.                             The patient is on the cardiac monitor to evaluate for evidence of arrhythmia and/or significant heart rate changes. Patient's presentation is most consistent with acute presentation with potential threat to life or bodily function. Patient is a 26 year old female who presents for right upper quadrant abdominal pain History and exam AAA, pancreatitis, SBO, appendicitis, mesenteric ischemia, nephrolithiasis, pyelonephritis, or diverticulitis.  ED Interventions: analgesia PRN ED Workup: CBC, BMP, LFTs, Lipase, CT abdomen/pelvis ED Interventions: NPO with IVF CT of the abdomen and pelvis shows signs and symptoms concerning for possible right-sided  pyelonephritis and right upper quadrant ultrasound is equivocal for possible cholecystitis Consults: General surgery, Dr. Aleen Campi recommended MRCP for further evaluation Hospitalist agrees to except this patient onto their service for further evaluation and management ABX: Zosyn 3.375g iv  Disposition: Admit to General Surgery   FINAL CLINICAL IMPRESSION(S) / ED DIAGNOSES   Final diagnoses:  Acute pyelonephritis  Abdominal pain, right upper quadrant   Rx / DC Orders   ED Discharge Orders     None      Note:  This document was prepared using Dragon voice recognition software and may include unintentional dictation errors.   Merwyn Katos, MD 06/21/22 1525    Merwyn Katos, MD 06/21/22  1525    Merwyn Katos, MD 06/21/22 [phone]

## 2022-06-21 NOTE — Hospital Course (Signed)
Victoria Villarreal is a 26 year old [id number] female with history of gestational hypertension, glucose intolerance, who presents emergency department for chief concerns of abdominal pain.  Initial vitals in the emergency department showed temperature of 98.5, respiration rate of 17, heart rate of 64, blood pressure 156/97, SPO2 of 100% on room air.  Serum sodium is 141, potassium 2.9, chloride 106, bicarb 25, BUN of 6, serum creatinine of 0.70, nonfasting blood glucose 154, GFR greater than 60, WBC 7.9, hemoglobin 12.2, platelets of 359.  Urine pregnancy test was negative.  UA was positive for nitrates and increased specific gravity of greater than 1.046.  EDP discussed case with general surgery, Dr. Aleen Campi who recommends MRCP with and without contrast to assess for cholecystitis secondary to cholelithiasis.  ED treatment: Dilaudid 1 mg IV, Dilaudid 0.5 mg IV, Toradol 30 mg IV, ondansetron 4 mg IV, potassium chloride 40 mill: P.o., sodium chloride 1 L bolus, 500 mL sodium chloride with K chlor 20 mill equivalent, and piperacillin-tazobactam 3.375 g IV one-time dose.

## 2022-06-21 NOTE — Assessment & Plan Note (Signed)
-   Mild and associated with abdominal pain - Status post Ativan 1 mg IV per EDP - I have ordered Ativan 0.5 mg IV every 6 hours as needed for anxiety, 4 doses ordered

## 2022-06-21 NOTE — Assessment & Plan Note (Signed)
-   Continue symptomatic support with morphine 4 mg IV every 4 hours as needed for moderate pain, 5 doses ordered; Dilaudid 1 mg IV every 4 hours as needed for severe pain, 4 doses ordered - Continue ceftriaxone 2 g IV daily and metronidazole 500 mg IV twice daily - General surgery has been consulted by EDP for possible cholecystitis secondary to cholelithiasis given patient's acute right upper quadrant abdominal pain and postpartum (delivery on 04/23/2022)

## 2022-06-21 NOTE — Assessment & Plan Note (Addendum)
-   Patient was discharged with Procardia/nifedipine upon delivery, patient did not fill outpatient and did not start taking - I have resumed prescribed nifedipine 30 mg daily as needed for SBP greater than 175, 12 days ordered - Hydralazine 10 mg p.o. every 6 hours as needed for SBP greater than 175, 3 days ordered starting at 3 AM on day of admission

## 2022-06-21 NOTE — H&P (Addendum)
History and Physical   Victoria Villarreal ZOX:[id number] DOB: Mar 17, 1996 DOA: 06/21/2022  PCP: [place]errilyn [place], [facility]  Outpatient Specialists: Dr. Donato Schultzanielle Wilson, [facility] Patient coming from: Home  I have personally briefly reviewed patient's old medical records in [facility].  Chief Concern: Abdominal pain  HPI: Victoria Villarreal is a 26 year old [id number] female with history of gestational hypertension, glucose intolerance, who presents emergency department for chief concerns of abdominal pain.  Initial vitals in the emergency department showed temperature of 98.5, respiration rate of 17, heart rate of 64, blood pressure 156/97, SPO2 of 100% on room air.  Serum sodium is 141, potassium 2.9, chloride 106, bicarb 25, BUN of 6, serum creatinine of 0.70, nonfasting blood glucose 154, GFR greater than 60, WBC 7.9, hemoglobin 12.2, platelets of 359.  Urine pregnancy test was negative.  UA was positive for nitrates and increased specific gravity of greater than 1.046.  EDP discussed case with general surgery, Dr. Aleen CampiPiscoya who recommends MRCP with and without contrast to assess for cholecystitis secondary to cholelithiasis.  ED treatment: Dilaudid 1 mg IV, Dilaudid 0.5 mg IV, Toradol 30 mg IV, ondansetron 4 mg IV, potassium chloride 40 mill: P.o., sodium chloride 1 L bolus, 500 mL sodium chloride with K chlor 20 mill equivalent, and piperacillin-tazobactam 3.375 g IV one-time dose.  At bedside, she is able to tell me her name, age, current calendar year.   She reports waking up at approximately 3 AM on day of admission feeling abdominal pain that is sharp and persistent.  The pain is worse with deep palpation at the right upper quadrant.  She endorses subjective fever and chills.  She endorses nausea and vomiting x2 in the last day.  She reports vomiting up food negative for blood and or coffee-ground emesis.  She denies trauma to her person. She denies syncope, loss of consciousness,  swelling of her lower extremities, dysphagia. She denies dysuria, hematuria,  polyuria.  She endorses loose bowel movements with some watery consistency, 2 episodes in the past 1 day.  Social history: She lives at home with her kids. She current smokes tobacco, 1 ppd. She infrequently drinks etoh, 1/5 on Friday. She denies recreational drug use.   ROS: Constitutional: no weight change, + fever ENT/Mouth: no sore throat, no rhinorrhea Eyes: no eye pain, no vision changes Cardiovascular: no chest pain, no dyspnea,  no edema, no palpitations Respiratory: no cough, no sputum, no wheezing Gastrointestinal: + nausea, + vomiting, + diarrhea, no constipation Genitourinary: no urinary incontinence, no dysuria, no hematuria Musculoskeletal: no arthralgias, no myalgias Skin: no skin lesions, no pruritus, Neuro: + weakness, no loss of consciousness, no syncope Psych: no anxiety, no depression, + decrease appetite Heme/Lymph: no bruising, no bleeding  ED Course: Discussed with emergency medicine provider, patient requiring hospitalization for chief concerns of acute pyelonephritis with possible cholecystitis.  Assessment/Plan  Principal Problem:   Abdominal pain Active Problems:   Gestational hypertension   Elevated glucose tolerance test   Transaminitis   Acute pyelonephritis   Anxiety   Assessment and Plan:  * Abdominal pain - Continue symptomatic support with morphine 4 mg IV every 4 hours as needed for moderate pain, 5 doses ordered; Dilaudid 1 mg IV every 4 hours as needed for severe pain, 4 doses ordered - Continue ceftriaxone 2 g IV daily and metronidazole 500 mg IV twice daily - General surgery has been consulted by EDP for possible cholecystitis secondary to cholelithiasis given patient's acute right upper quadrant abdominal pain  and postpartum (delivery on 04/23/2022)  Anxiety - Mild and associated with abdominal pain - Status post Ativan 1 mg IV per EDP - I have ordered Ativan  0.5 mg IV every 6 hours as needed for anxiety, 4 doses ordered  Acute pyelonephritis - Add urine culture to prior collection - Continue with ceftriaxone 2 g IV daily, metronidazole 500 mg IV twice daily - Admit to MedSurg, observation, no telemetry  Transaminitis vs Acute hepatitis per MRCP - LFTs in the a.m. - Check acetaminophen level and ethanol level - Acute hepatitis panel ordered  Gestational hypertension - Patient was discharged with Procardia/nifedipine upon delivery, patient did not fill outpatient and did not start taking - I have resumed prescribed nifedipine 30 mg daily as needed for SBP greater than 175, 12 days ordered - Hydralazine 10 mg p.o. every 6 hours as needed for SBP greater than 175, 3 days ordered starting at 3 AM on day of admission  Chart reviewed.   DVT prophylaxis: Heparin 5000 units subcutaneous scheduled for 2200. Code Status: Full code Diet: N.p.o. except for sips of meds Family Communication: Updated father at bedside with patient's permission Disposition Plan: Pending clinical course Consults called: General surgery Admission status: MedSurg, observation  Past Medical History:  Diagnosis Date   Anemia    Depression    Trichomoniasis    Past Surgical History:  Procedure Laterality Date   NO PAST SURGERIES     Social History:  reports that she has been smoking cigarettes. She has been smoking an average of .25 packs per day. She has never used smokeless tobacco. She reports that she does not currently use alcohol after a past usage of about 1.0 standard drink of alcohol per week. She reports current drug use. Drug: Marijuana.  Allergies  Allergen Reactions   Hydrocodone Rash   Tramadol Rash   History reviewed. No pertinent family history. Family history: Family history reviewed and not pertinent  Prior to Admission medications   Medication Sig Start Date End Date Taking? Authorizing Provider  acetaminophen (TYLENOL) 325 MG tablet Take 2  tablets (650 mg total) by mouth every 4 (four) hours as needed (for pain scale < 4). 04/24/22   Gustavo Lah, CNM  ibuprofen (ADVIL) 600 MG tablet Take 1 tablet (600 mg total) by mouth every 6 (six) hours as needed for mild pain or cramping. 04/24/22   Gustavo Lah, CNM  NIFEdipine (ADALAT CC) 30 MG 24 hr tablet Take 1 tablet (30 mg total) by mouth daily. Patient not taking: Reported on 06/21/2022 04/25/22   Gustavo Lah, CNM  Prenatal Vit-Fe Fumarate-FA (PRENATAL MULTIVITAMIN) TABS tablet Take 1 tablet by mouth daily at 12 noon.    [provider]   Physical Exam: Vitals:   06/21/22 1215 06/21/22 1330 06/21/22 1400 06/21/22 1430  BP: (!) 155/93 110/79 127/74 125/79  Pulse: (!) 47 (!) 46 (!) 48 (!) 48  Resp: 18     Temp:      TempSrc:      SpO2: 99% 100% 99% 99%  Weight:      Height:       Constitutional: appears appropriate, NAD, calm, comfortable Eyes: PERRL, lids and conjunctivae normal ENMT: Mucous membranes are moist. Posterior pharynx clear of any exudate or lesions. Age-appropriate dentition. Hearing appropriate Neck: normal, supple, no masses, no thyromegaly Respiratory: clear to auscultation bilaterally, no wheezing, no crackles. Normal respiratory effort. No accessory muscle use.  Cardiovascular: Regular rate and rhythm, no murmurs / rubs /  gallops. No extremity edema. 2+ pedal pulses. No carotid bruits.  Abdomen: Mild obese abdomen, + at the right upper quadrant tenderness, no masses palpated, no hepatosplenomegaly. Bowel sounds positive.  Musculoskeletal: no clubbing / cyanosis. No joint deformity upper and lower extremities. Good ROM, no contractures, no atrophy. Normal muscle tone.  Skin: no rashes, lesions, ulcers. No induration Neurologic: Sensation intact. Strength 5/5 in all 4.  Psychiatric: Normal judgment and insight. Alert and oriented x 3. Normal mood.   EKG: independently reviewed, showing sinus arrhythmia with a rate of 65, QTc 454  Chest x-ray on  Admission: I personally reviewed and I agree with radiologist reading as below.  MR ABDOMEN MRCP W WO CONTAST  Result Date: 06/21/2022 CLINICAL DATA:  Cholelithiasis. EXAM: MRI ABDOMEN WITHOUT AND WITH CONTRAST (INCLUDING MRCP) TECHNIQUE: Multiplanar multisequence MR imaging of the abdomen was performed both before and after the administration of intravenous contrast. Heavily T2-weighted images of the biliary and pancreatic ducts were obtained, and three-dimensional MRCP images were rendered by post processing. CONTRAST:  56mL GADAVIST GADOBUTROL 1 MMOL/ML IV SOLN COMPARISON:  Right upper quadrant ultrasound and CT abdomen pelvis both dated June 21, 2022. FINDINGS: Lower chest: No acute abnormality. Hepatobiliary: No significant hepatic steatosis. Periportal edema. Slightly mottled appearance of the hepatic parenchyma postcontrast administration. Cholelithiasis in a nondistended gallbladder with thickening of the gallbladder wall thickening of the gallbladder. No choledocholithiasis. No biliary ductal dilation. Pancreas: No mass, inflammatory changes, or other parenchymal abnormality identified. Spleen:  No splenomegaly Adrenals/Urinary Tract: Bilateral adrenal glands appear normal. No hydronephrosis. Kidneys demonstrate symmetric enhancement. Stomach/Bowel: Visualized portions within the abdomen are unremarkable. Vascular/Lymphatic: No pathologically enlarged lymph nodes identified. Portal, splenic and superior mesenteric veins are patent. No abdominal aortic aneurysm demonstrated. Other:  No significant abdominal free fluid. Musculoskeletal: No suspicious bone lesions identified. IMPRESSION: 1. Slightly mottled appearance of the hepatic parenchyma postcontrast administration, periportal edema, cholelithiasis in a nondistended gallbladder and prominent gallbladder wall thickening. Constellation of findings favored to reflect sequela of hepatitis. Although technically acute calculus cholecystitis can not be  excluded is NOT favored given the lack of gallbladder distension and that this degree of gallbladder wall thickening is more commonly associated with a hepatic process over gallbladder inflammation. If continued clinical concern for cholecystitis suggest further evaluation with nuclear medicine HIDA scan to assess cystic duct patency. 2. No choledocholithiasis or biliary ductal dilation. Electronically Signed   By: Maudry Mayhew M.D.   On: 06/21/2022 15:31   MR 3D Recon At Scanner  Result Date: 06/21/2022 CLINICAL DATA:  Cholelithiasis. EXAM: MRI ABDOMEN WITHOUT AND WITH CONTRAST (INCLUDING MRCP) TECHNIQUE: Multiplanar multisequence MR imaging of the abdomen was performed both before and after the administration of intravenous contrast. Heavily T2-weighted images of the biliary and pancreatic ducts were obtained, and three-dimensional MRCP images were rendered by post processing. CONTRAST:  38mL GADAVIST GADOBUTROL 1 MMOL/ML IV SOLN COMPARISON:  Right upper quadrant ultrasound and CT abdomen pelvis both dated June 21, 2022. FINDINGS: Lower chest: No acute abnormality. Hepatobiliary: No significant hepatic steatosis. Periportal edema. Slightly mottled appearance of the hepatic parenchyma postcontrast administration. Cholelithiasis in a nondistended gallbladder with thickening of the gallbladder wall thickening of the gallbladder. No choledocholithiasis. No biliary ductal dilation. Pancreas: No mass, inflammatory changes, or other parenchymal abnormality identified. Spleen:  No splenomegaly Adrenals/Urinary Tract: Bilateral adrenal glands appear normal. No hydronephrosis. Kidneys demonstrate symmetric enhancement. Stomach/Bowel: Visualized portions within the abdomen are unremarkable. Vascular/Lymphatic: No pathologically enlarged lymph nodes identified. Portal, splenic and superior mesenteric veins are  patent. No abdominal aortic aneurysm demonstrated. Other:  No significant abdominal free fluid.  Musculoskeletal: No suspicious bone lesions identified. IMPRESSION: 1. Slightly mottled appearance of the hepatic parenchyma postcontrast administration, periportal edema, cholelithiasis in a nondistended gallbladder and prominent gallbladder wall thickening. Constellation of findings favored to reflect sequela of hepatitis. Although technically acute calculus cholecystitis can not be excluded is NOT favored given the lack of gallbladder distension and that this degree of gallbladder wall thickening is more commonly associated with a hepatic process over gallbladder inflammation. If continued clinical concern for cholecystitis suggest further evaluation with nuclear medicine HIDA scan to assess cystic duct patency. 2. No choledocholithiasis or biliary ductal dilation. Electronically Signed   By: Maudry Mayhew M.D.   On: 06/21/2022 15:31   US Abdomen Limited RUQ (LIVER/GB)  Result Date: 06/21/2022 CLINICAL DATA:  Transaminitis. Possible gallbladder wall thickening seen on CT performed earlier today. EXAM: ULTRASOUND ABDOMEN LIMITED RIGHT UPPER QUADRANT COMPARISON:  CT abdomen dated 06/21/2022. FINDINGS: Gallbladder: Multiple small gallstones, largest stone measured at 5 mm. Gallbladder wall is mildly thickened, measuring 4 mm. No pericholecystic fluid seen. No sonographic Murphy's sign elicited during the exam. Common bile duct: Diameter: 5 mm Liver: No focal lesion identified. Within normal limits in parenchymal echogenicity. Portal vein is patent on color Doppler imaging with normal direction of blood flow towards the liver. Other: None. IMPRESSION: 1. Cholelithiasis. 2. Mild gallbladder wall thickening, measuring 4 mm. However, no pericholecystic fluid or sonographic Murphy's sign elicited to confirm an acute cholecystitis. Mild gallbladder wall thickening could indicate a chronic cholecystitis. If clinically needed, consider nuclear medicine HIDA scan to exclude early acute cholecystitis. 3. No bile duct  dilatation. Electronically Signed   By: Bary Richard M.D.   On: 06/21/2022 13:07   CT Abdomen Pelvis W Contrast  Result Date: 06/21/2022 CLINICAL DATA:  Right upper quadrant pain, nausea EXAM: CT ABDOMEN AND PELVIS WITH CONTRAST TECHNIQUE: Multidetector CT imaging of the abdomen and pelvis was performed using the standard protocol following bolus administration of intravenous contrast. RADIATION DOSE REDUCTION: This exam was performed according to the departmental dose-optimization program which includes automated exposure control, adjustment of the mA and/or kV according to patient size and/or use of iterative reconstruction technique. CONTRAST:  OMNIPAQUE IOHEXOL 300 MG/ML  SOLN COMPARISON:  06/09/2014 FINDINGS: Lower chest: Dependent bibasilar atelectasis.  Normal heart size. Hepatobiliary: No focal liver abnormality is identified. Suggestion of mild gallbladder wall thickening. No hyperdense gallstone. No biliary dilatation. Pancreas: Unremarkable. No pancreatic ductal dilatation or surrounding inflammatory changes. Spleen: Normal in size without focal abnormality. Adrenals/Urinary Tract: Unremarkable adrenal glands. There is a wedge-shaped area of decreased enhancement within the lower pole of the right kidney with adjacent perinephric fat stranding (series 2, image 43). The left kidney enhances normally. No focal lesion, stone, or hydronephrosis. Ureters are nondilated. Urinary bladder appears unremarkable. Stomach/Bowel: Stomach is within normal limits. Appendix appears normal (series 2, image 67). No evidence of bowel wall thickening, distention, or inflammatory changes. Vascular/Lymphatic: No significant vascular findings are present. No enlarged abdominal or pelvic lymph nodes. Reproductive: Anteverted uterus.  No adnexal masses. Other: No free fluid. No abdominopelvic fluid collection. No pneumoperitoneum. No abdominal wall hernia. Musculoskeletal: No acute or significant osseous findings.  IMPRESSION: 1. Findings suspicious for acute pyelonephritis of the right kidney. Correlation with urinalysis is recommended. 2. Suggestion of mild gallbladder wall thickening. If there is clinical concern for acute cholecystitis, further evaluation with right upper quadrant ultrasound could be performed. Electronically Signed   By:  Nicholas  Plundo D.O.   On: 06/21/2022 11:09    Labs on Admission: I have personally reviewed following labs  CBC: Recent Labs  Lab 06/21/22 0913  WBC 7.9  HGB 12.2  HCT 38.2  MCV 82.3  PLT 359   Basic Metabolic Panel: Recent Labs  Lab 06/21/22 0913  NA 141  K 2.9*  CL 106  CO2 25  GLUCOSE 154*  BUN 6  CREATININE 0.70  CALCIUM 9.0   GFR: Estimated Creatinine Clearance: 127.5 mL/min (by C-G formula based on SCr of 0.7 mg/dL).  Liver Function Tests: Recent Labs  Lab 06/21/22 0913  AST 638*  ALT 345*  ALKPHOS 224*  BILITOT 1.0  PROT 7.8  ALBUMIN 3.8   Recent Labs  Lab 06/21/22 0913  LIPASE 29   Urine analysis:    Component Value Date/Time   COLORURINE AMBER (A) 06/21/2022 0856   APPEARANCEUR CLEAR (A) 06/21/2022 0856   APPEARANCEUR Turbid 06/09/2014 1114   LABSPEC >1.046 (H) 06/21/2022 0856   LABSPEC 1.014 06/09/2014 1114   PHURINE 7.0 06/21/2022 0856   GLUCOSEU NEGATIVE 06/21/2022 0856   GLUCOSEU Negative 06/09/2014 1114   HGBUR NEGATIVE 06/21/2022 0856   BILIRUBINUR NEGATIVE 06/21/2022 0856   BILIRUBINUR Negative 06/09/2014 1114   KETONESUR NEGATIVE 06/21/2022 0856   PROTEINUR 30 (A) 06/21/2022 0856   NITRITE POSITIVE (A) 06/21/2022 0856   LEUKOCYTESUR NEGATIVE 06/21/2022 0856   LEUKOCYTESUR 3+ 06/09/2014 1114   Dr. Sedalia Muta [facility]  If 7PM-7AM, please contact overnight-coverage provider If 7AM-7PM, please contact day coverage provider [email or web address]  06/21/2022, 3:39 PM

## 2022-06-21 NOTE — Assessment & Plan Note (Addendum)
vs Acute hepatitis per MRCP - LFTs in the a.m. - Check acetaminophen level and ethanol level - Acute hepatitis panel ordered

## 2022-06-21 NOTE — Assessment & Plan Note (Signed)
-   Add urine culture to prior collection - Continue with ceftriaxone 2 g IV daily, metronidazole 500 mg IV twice daily - Admit to MedSurg, observation, no telemetry

## 2022-06-21 NOTE — Consult Note (Signed)
Date of Consultation:  06/21/2022  Requesting Physician:  Donna BernardEvan Bradler, MD  Reason for Consultation:  Abdominal pain  History of Present Illness: Victoria Villarreal is a 26 y.o. female presenting to the hospital today with sudden onset of abdominal pain that started around 3 AM today.  There is associated with nausea and vomiting as well as diarrhea.  The pain is located in the mid epigastric area and also in the lower abdomen.  The patient reports having similar symptoms a few days ago and reports having some symptoms similar in nature during her pregnancy.  Denies any fevers or chills, chest pain, shortness of breath, constipation, blood in her stool.  In the emergency room, the patient had laboratory work-up which showed a total bilirubin of 1.0, however with elevated AST of 638, ALT of 345, and alkaline phosphatase of 224.  Her white blood cell count was normal at 7.9.  She initially started with a CT scan of the abdomen pelvis which showed right-sided acute pyelonephritis as well as some possible mild gallbladder wall thickening.  She had an ultrasound of the right upper quadrant which showed cholelithiasis and mild gallbladder wall thickening measuring 4 mm.  There was negative Murphy sign and no pericholecystic fluid.  Given the elevated LFTs, MRCP was obtained which actually showed periportal edema with mottled appearance of the hepatic parenchyma possibly favoring sequela of hepatitis rather than cholecystitis.  Patient was admitted to medical team for management of her pyelonephritis as well as work-up for her gallbladder.  Past Medical History: Past Medical History:  Diagnosis Date   Anemia    Depression    Trichomoniasis      Past Surgical History: Past Surgical History:  Procedure Laterality Date   NO PAST SURGERIES      Home Medications: Prior to Admission medications   Medication Sig Start Date End Date Taking? Authorizing Provider  acetaminophen (TYLENOL) 325 MG tablet Take 2  tablets (650 mg total) by mouth every 4 (four) hours as needed (for pain scale < 4). 04/24/22   Gustavo LahMackie, Anna M, CNM  ibuprofen (ADVIL) 600 MG tablet Take 1 tablet (600 mg total) by mouth every 6 (six) hours as needed for mild pain or cramping. 04/24/22   Gustavo LahMackie, Anna M, CNM  NIFEdipine (ADALAT CC) 30 MG 24 hr tablet Take 1 tablet (30 mg total) by mouth daily. Patient not taking: Reported on 06/21/2022 04/25/22   Gustavo LahMackie, Anna M, CNM  Prenatal Vit-Fe Fumarate-FA (PRENATAL MULTIVITAMIN) TABS tablet Take 1 tablet by mouth daily at 12 noon.    [provider]    Allergies: Allergies  Allergen Reactions   Hydrocodone Rash   Tramadol Rash    Social History:  reports that she has been smoking cigarettes. She has been smoking an average of .25 packs per day. She has never used smokeless tobacco. She reports that she does not currently use alcohol after a past usage of about 1.0 standard drink of alcohol per week. She reports current drug use. Drug: Marijuana.   Family History: History reviewed. No pertinent family history.  Review of Systems: Review of Systems  Constitutional:  Negative for chills and fever.  HENT:  Negative for hearing loss.   Respiratory:  Negative for shortness of breath.   Cardiovascular:  Negative for chest pain.  Gastrointestinal:  Positive for abdominal pain, diarrhea, nausea and vomiting.  Genitourinary:  Negative for dysuria.  Musculoskeletal:  Negative for myalgias.  Skin:  Negative for rash.  Neurological:  Negative for  dizziness.  Psychiatric/Behavioral:  Negative for depression.     Physical Exam BP (!) 155/107 (BP Location: Left Arm)   Pulse (!) 49   Temp 98.5 F (36.9 C) (Oral)   Resp 14   Ht 5\' 7"  (1.702 m)   Wt 97 kg   LMP 06/15/2022 (Approximate)   SpO2 95%   BMI 33.49 kg/m  CONSTITUTIONAL: No acute distress, patient is sleepy HEENT:  Normocephalic, atraumatic, extraocular motion intact. NECK: Trachea is midline, and there is no jugular  venous distension. RESPIRATORY:  Normal respiratory effort without pathologic use of accessory muscles. CARDIOVASCULAR: Regular rhythm and rate. GI: The abdomen is soft, nondistended, with some discomfort to palpation in the midepigastric area as well as in the lower abdomen.  There is some mild discomfort in the right upper quadrant but it is worse in the midepigastric area.  Negative Murphy's sign.  MUSCULOSKELETAL:  Normal muscle strength and tone in all four extremities.  No peripheral edema or cyanosis. SKIN: Skin turgor is normal. There are no pathologic skin lesions.  NEUROLOGIC:  Motor and sensation is grossly normal.  Cranial nerves are grossly intact. PSYCH:  Alert and oriented to person, place and time. Affect is normal.  Laboratory Analysis: Results for orders placed or performed during the hospital encounter of 06/21/22 (from the past 24 hour(s))  Urinalysis, Routine w reflex microscopic Urine, Clean Catch     Status: Abnormal   Collection Time: 06/21/22  8:56 AM  Result Value Ref Range   Color, Urine AMBER (A) YELLOW   APPearance CLEAR (A) CLEAR   Specific Gravity, Urine >1.046 (H) 1.005 - 1.030   pH 7.0 5.0 - 8.0   Glucose, UA NEGATIVE NEGATIVE mg/dL   Hgb urine dipstick NEGATIVE NEGATIVE   Bilirubin Urine NEGATIVE NEGATIVE   Ketones, ur NEGATIVE NEGATIVE mg/dL   Protein, ur 30 (A) NEGATIVE mg/dL   Nitrite POSITIVE (A) NEGATIVE   Leukocytes,Ua NEGATIVE NEGATIVE   RBC / HPF 0-5 0 - 5 RBC/hpf   WBC, UA 0-5 0 - 5 WBC/hpf   Bacteria, UA MANY (A) NONE SEEN   Squamous Epithelial / LPF 6-10 0 - 5   Mucus PRESENT   POC urine preg, ED     Status: None   Collection Time: 06/21/22  9:11 AM  Result Value Ref Range   Preg Test, Ur Negative Negative  Lipase, blood     Status: None   Collection Time: 06/21/22  9:13 AM  Result Value Ref Range   Lipase 29 11 - 51 U/L  Comprehensive metabolic panel     Status: Abnormal   Collection Time: 06/21/22  9:13 AM  Result Value Ref Range    Sodium 141 135 - 145 mmol/L   Potassium 2.9 (L) 3.5 - 5.1 mmol/L   Chloride 106 98 - 111 mmol/L   CO2 25 22 - 32 mmol/L   Glucose, Bld 154 (H) 70 - 99 mg/dL   BUN 6 6 - 20 mg/dL   Creatinine, Ser 08/21/22 0.44 - 1.00 mg/dL   Calcium 9.0 8.9 - 2.29 mg/dL   Total Protein 7.8 6.5 - 8.1 g/dL   Albumin 3.8 3.5 - 5.0 g/dL   AST 79.8 (H) 15 - 41 U/L   ALT 345 (H) 0 - 44 U/L   Alkaline Phosphatase 224 (H) 38 - 126 U/L   Total Bilirubin 1.0 0.3 - 1.2 mg/dL   GFR, Estimated 921 >19 mL/min   Anion gap 10 5 - 15  CBC  Status: Abnormal   Collection Time: 06/21/22  9:13 AM  Result Value Ref Range   WBC 7.9 4.0 - 10.5 K/uL   RBC 4.64 3.87 - 5.11 MIL/uL   Hemoglobin 12.2 12.0 - 15.0 g/dL   HCT 89.3 81.0 - 17.5 %   MCV 82.3 80.0 - 100.0 fL   MCH 26.3 26.0 - 34.0 pg   MCHC 31.9 30.0 - 36.0 g/dL   RDW 10.2 (H) 58.5 - 27.7 %   Platelets 359 150 - 400 K/uL   nRBC 0.0 0.0 - 0.2 %  SARS Coronavirus 2 by RT PCR (hospital order, performed in [facility] hospital lab) *cepheid single result test* Anterior Nasal Swab     Status: None   Collection Time: 06/21/22  2:22 PM   Specimen: Anterior Nasal Swab  Result Value Ref Range   SARS Coronavirus 2 by RT PCR NEGATIVE NEGATIVE  Ethanol     Status: None   Collection Time: 06/21/22  2:42 PM  Result Value Ref Range   Alcohol, Ethyl (B) <10 <10 mg/dL  Acetaminophen level     Status: Abnormal   Collection Time: 06/21/22  2:42 PM  Result Value Ref Range   Acetaminophen (Tylenol), Serum <10 (L) 10 - 30 ug/mL    Imaging: MR ABDOMEN MRCP W WO CONTAST  Result Date: 06/21/2022 CLINICAL DATA:  Cholelithiasis. EXAM: MRI ABDOMEN WITHOUT AND WITH CONTRAST (INCLUDING MRCP) TECHNIQUE: Multiplanar multisequence MR imaging of the abdomen was performed both before and after the administration of intravenous contrast. Heavily T2-weighted images of the biliary and pancreatic ducts were obtained, and three-dimensional MRCP images were rendered by post processing.  CONTRAST:  77mL GADAVIST GADOBUTROL 1 MMOL/ML IV SOLN COMPARISON:  Right upper quadrant ultrasound and CT abdomen pelvis both dated June 21, 2022. FINDINGS: Lower chest: No acute abnormality. Hepatobiliary: No significant hepatic steatosis. Periportal edema. Slightly mottled appearance of the hepatic parenchyma postcontrast administration. Cholelithiasis in a nondistended gallbladder with thickening of the gallbladder wall thickening of the gallbladder. No choledocholithiasis. No biliary ductal dilation. Pancreas: No mass, inflammatory changes, or other parenchymal abnormality identified. Spleen:  No splenomegaly Adrenals/Urinary Tract: Bilateral adrenal glands appear normal. No hydronephrosis. Kidneys demonstrate symmetric enhancement. Stomach/Bowel: Visualized portions within the abdomen are unremarkable. Vascular/Lymphatic: No pathologically enlarged lymph nodes identified. Portal, splenic and superior mesenteric veins are patent. No abdominal aortic aneurysm demonstrated. Other:  No significant abdominal free fluid. Musculoskeletal: No suspicious bone lesions identified. IMPRESSION: 1. Slightly mottled appearance of the hepatic parenchyma postcontrast administration, periportal edema, cholelithiasis in a nondistended gallbladder and prominent gallbladder wall thickening. Constellation of findings favored to reflect sequela of hepatitis. Although technically acute calculus cholecystitis can not be excluded is NOT favored given the lack of gallbladder distension and that this degree of gallbladder wall thickening is more commonly associated with a hepatic process over gallbladder inflammation. If continued clinical concern for cholecystitis suggest further evaluation with nuclear medicine HIDA scan to assess cystic duct patency. 2. No choledocholithiasis or biliary ductal dilation. Electronically Signed   By: Maudry Mayhew M.D.   On: 06/21/2022 15:31   MR 3D Recon At Scanner  Result Date:  06/21/2022 CLINICAL DATA:  Cholelithiasis. EXAM: MRI ABDOMEN WITHOUT AND WITH CONTRAST (INCLUDING MRCP) TECHNIQUE: Multiplanar multisequence MR imaging of the abdomen was performed both before and after the administration of intravenous contrast. Heavily T2-weighted images of the biliary and pancreatic ducts were obtained, and three-dimensional MRCP images were rendered by post processing. CONTRAST:  21mL GADAVIST GADOBUTROL 1 MMOL/ML IV SOLN COMPARISON:  Right upper quadrant ultrasound and CT abdomen pelvis both dated June 21, 2022. FINDINGS: Lower chest: No acute abnormality. Hepatobiliary: No significant hepatic steatosis. Periportal edema. Slightly mottled appearance of the hepatic parenchyma postcontrast administration. Cholelithiasis in a nondistended gallbladder with thickening of the gallbladder wall thickening of the gallbladder. No choledocholithiasis. No biliary ductal dilation. Pancreas: No mass, inflammatory changes, or other parenchymal abnormality identified. Spleen:  No splenomegaly Adrenals/Urinary Tract: Bilateral adrenal glands appear normal. No hydronephrosis. Kidneys demonstrate symmetric enhancement. Stomach/Bowel: Visualized portions within the abdomen are unremarkable. Vascular/Lymphatic: No pathologically enlarged lymph nodes identified. Portal, splenic and superior mesenteric veins are patent. No abdominal aortic aneurysm demonstrated. Other:  No significant abdominal free fluid. Musculoskeletal: No suspicious bone lesions identified. IMPRESSION: 1. Slightly mottled appearance of the hepatic parenchyma postcontrast administration, periportal edema, cholelithiasis in a nondistended gallbladder and prominent gallbladder wall thickening. Constellation of findings favored to reflect sequela of hepatitis. Although technically acute calculus cholecystitis can not be excluded is NOT favored given the lack of gallbladder distension and that this degree of gallbladder wall thickening is more  commonly associated with a hepatic process over gallbladder inflammation. If continued clinical concern for cholecystitis suggest further evaluation with nuclear medicine HIDA scan to assess cystic duct patency. 2. No choledocholithiasis or biliary ductal dilation. Electronically Signed   By: Maudry Mayhew M.D.   On: 06/21/2022 15:31   US Abdomen Limited RUQ (LIVER/GB)  Result Date: 06/21/2022 CLINICAL DATA:  Transaminitis. Possible gallbladder wall thickening seen on CT performed earlier today. EXAM: ULTRASOUND ABDOMEN LIMITED RIGHT UPPER QUADRANT COMPARISON:  CT abdomen dated 06/21/2022. FINDINGS: Gallbladder: Multiple small gallstones, largest stone measured at 5 mm. Gallbladder wall is mildly thickened, measuring 4 mm. No pericholecystic fluid seen. No sonographic Murphy's sign elicited during the exam. Common bile duct: Diameter: 5 mm Liver: No focal lesion identified. Within normal limits in parenchymal echogenicity. Portal vein is patent on color Doppler imaging with normal direction of blood flow towards the liver. Other: None. IMPRESSION: 1. Cholelithiasis. 2. Mild gallbladder wall thickening, measuring 4 mm. However, no pericholecystic fluid or sonographic Murphy's sign elicited to confirm an acute cholecystitis. Mild gallbladder wall thickening could indicate a chronic cholecystitis. If clinically needed, consider nuclear medicine HIDA scan to exclude early acute cholecystitis. 3. No bile duct dilatation. Electronically Signed   By: Bary Richard M.D.   On: 06/21/2022 13:07   CT Abdomen Pelvis W Contrast  Result Date: 06/21/2022 CLINICAL DATA:  Right upper quadrant pain, nausea EXAM: CT ABDOMEN AND PELVIS WITH CONTRAST TECHNIQUE: Multidetector CT imaging of the abdomen and pelvis was performed using the standard protocol following bolus administration of intravenous contrast. RADIATION DOSE REDUCTION: This exam was performed according to the departmental dose-optimization program which includes  automated exposure control, adjustment of the mA and/or kV according to patient size and/or use of iterative reconstruction technique. CONTRAST:  OMNIPAQUE IOHEXOL 300 MG/ML  SOLN COMPARISON:  06/09/2014 FINDINGS: Lower chest: Dependent bibasilar atelectasis.  Normal heart size. Hepatobiliary: No focal liver abnormality is identified. Suggestion of mild gallbladder wall thickening. No hyperdense gallstone. No biliary dilatation. Pancreas: Unremarkable. No pancreatic ductal dilatation or surrounding inflammatory changes. Spleen: Normal in size without focal abnormality. Adrenals/Urinary Tract: Unremarkable adrenal glands. There is a wedge-shaped area of decreased enhancement within the lower pole of the right kidney with adjacent perinephric fat stranding (series 2, image 43). The left kidney enhances normally. No focal lesion, stone, or hydronephrosis. Ureters are nondilated. Urinary bladder appears unremarkable. Stomach/Bowel: Stomach is within normal limits. Appendix  appears normal (series 2, image 67). No evidence of bowel wall thickening, distention, or inflammatory changes. Vascular/Lymphatic: No significant vascular findings are present. No enlarged abdominal or pelvic lymph nodes. Reproductive: Anteverted uterus.  No adnexal masses. Other: No free fluid. No abdominopelvic fluid collection. No pneumoperitoneum. No abdominal wall hernia. Musculoskeletal: No acute or significant osseous findings. IMPRESSION: 1. Findings suspicious for acute pyelonephritis of the right kidney. Correlation with urinalysis is recommended. 2. Suggestion of mild gallbladder wall thickening. If there is clinical concern for acute cholecystitis, further evaluation with right upper quadrant ultrasound could be performed. Electronically Signed   By: Duanne Guess D.O.   On: 06/21/2022 11:09    Assessment and Plan: This is a 26 y.o. female with right-sided acute pyelonephritis and possible cholecystitis versus hepatitis.  -  Discussed with patient the findings on her laboratory work-up as well as imaging studies.  She does have acute right-sided pyelonephritis which on chart review, she has had this in the past as well.  The confounding portions with regards to the right upper quadrant area.  The CT scan ultrasound showed mild gallbladder wall thickening and MRCP showed findings more consistent with hepatitis.  Acute cholecystitis still not ruled out but given the findings on her MRCP, I think the prudent to follow the pathway of work-up for hepatitis.  If that is negative then I would suggest obtaining a HIDA scan to truly confirm if she does have cholecystitis or not.  Discussed with the patient that we will want to be sure prior to offering any surgical intervention.  Currently she has a negative Murphy sign and there is no urgent or emergent surgical need and it is okay to continue her work-up. - For now I think is also okay to give her clear liquid diet while awaiting the hepatitis panel work-up.  If that is negative, and we are not favoring then to do a HIDA scan, she will need to be n.p.o. for that. - Patient understands this plan and all of her questions have been answered.  Plan discussed with primary team as well.  I spent 45 minutes dedicated to the care of this patient on the date of this encounter to include pre-visit review of records, face-to-face time with the patient discussing diagnosis and management, and any post-visit coordination of care.   Howie Ill, MD [facility]:  [phone]

## 2022-06-21 NOTE — ED Triage Notes (Signed)
Pt reports severe constant abd pain since 0300 this am. Pt reports pain is mid abd from top to bottom. Pt tearful and constantly moving for comfort during triage. Pt reports some NVD as well.

## 2022-06-22 DIAGNOSIS — Z885 Allergy status to narcotic agent status: Secondary | ICD-10-CM | POA: Diagnosis not present

## 2022-06-22 DIAGNOSIS — B9620 Unspecified Escherichia coli [E. coli] as the cause of diseases classified elsewhere: Secondary | ICD-10-CM | POA: Diagnosis present

## 2022-06-22 DIAGNOSIS — N10 Acute tubulo-interstitial nephritis: Secondary | ICD-10-CM | POA: Diagnosis not present

## 2022-06-22 DIAGNOSIS — F17210 Nicotine dependence, cigarettes, uncomplicated: Secondary | ICD-10-CM | POA: Diagnosis present

## 2022-06-22 DIAGNOSIS — N12 Tubulo-interstitial nephritis, not specified as acute or chronic: Secondary | ICD-10-CM | POA: Diagnosis present

## 2022-06-22 DIAGNOSIS — Z20822 Contact with and (suspected) exposure to covid-19: Secondary | ICD-10-CM | POA: Diagnosis present

## 2022-06-22 DIAGNOSIS — N111 Chronic obstructive pyelonephritis: Secondary | ICD-10-CM | POA: Diagnosis present

## 2022-06-22 DIAGNOSIS — R1011 Right upper quadrant pain: Secondary | ICD-10-CM | POA: Diagnosis not present

## 2022-06-22 DIAGNOSIS — F419 Anxiety disorder, unspecified: Secondary | ICD-10-CM | POA: Diagnosis present

## 2022-06-22 DIAGNOSIS — R7401 Elevation of levels of liver transaminase levels: Secondary | ICD-10-CM | POA: Diagnosis not present

## 2022-06-22 DIAGNOSIS — K760 Fatty (change of) liver, not elsewhere classified: Secondary | ICD-10-CM | POA: Diagnosis present

## 2022-06-22 LAB — BASIC METABOLIC PANEL
Anion gap: 6 (ref 5–15)
BUN: <5 mg/dL — ABNORMAL LOW (ref 6–20)
CO2: 26 mmol/L (ref 22–32)
Calcium: 8.6 mg/dL — ABNORMAL LOW (ref 8.9–10.3)
Chloride: 111 mmol/L (ref 98–111)
Creatinine, Ser: 0.62 mg/dL (ref 0.44–1.00)
GFR, Estimated: >60 mL/min (ref >60)
Glucose, Bld: 109 mg/dL — ABNORMAL HIGH (ref 70–99)
Potassium: 3.6 mmol/L (ref 3.5–5.1)
Sodium: 143 mmol/L (ref 135–145)

## 2022-06-22 LAB — CBC
HCT: 34.8 % — ABNORMAL LOW (ref 36.0–46.0)
Hemoglobin: 10.8 g/dL — ABNORMAL LOW (ref 12.0–15.0)
MCH: 26.3 pg (ref 26.0–34.0)
MCHC: 31 g/dL (ref 30.0–36.0)
MCV: 84.7 fL (ref 80.0–100.0)
Platelets: 293 10*3/uL (ref 150–400)
RBC: 4.11 MIL/uL (ref 3.87–5.11)
RDW: 17.1 % — ABNORMAL HIGH (ref 11.5–15.5)
WBC: 3.4 10*3/uL — ABNORMAL LOW (ref 4.0–10.5)
nRBC: 0 % (ref 0.0–0.2)

## 2022-06-22 LAB — HEPATIC FUNCTION PANEL
ALT: 524 U/L — ABNORMAL HIGH (ref 0–44)
AST: 303 U/L — ABNORMAL HIGH (ref 15–41)
Albumin: 3 g/dL — ABNORMAL LOW (ref 3.5–5.0)
Alkaline Phosphatase: 199 U/L — ABNORMAL HIGH (ref 38–126)
Bilirubin, Direct: 0.4 mg/dL — ABNORMAL HIGH (ref 0.0–0.2)
Indirect Bilirubin: 0.3 mg/dL (ref 0.3–0.9)
Total Bilirubin: 0.7 mg/dL (ref 0.3–1.2)
Total Protein: 6.5 g/dL (ref 6.5–8.1)

## 2022-06-22 MED ORDER — NICOTINE 21 MG/24HR TD PT24
21.0000 mg | MEDICATED_PATCH | Freq: Every day | TRANSDERMAL | Status: DC
Start: 2022-06-22 — End: 2022-06-23
  Administered 2022-06-22: 21 mg via TRANSDERMAL
  Filled 2022-06-22 (×2): qty 1

## 2022-06-22 MED ORDER — DIPHENHYDRAMINE HCL 25 MG PO CAPS
25.0000 mg | ORAL_CAPSULE | ORAL | Status: DC | PRN
  Administered 2022-06-22: 25 mg via ORAL
  Filled 2022-06-22: qty 1

## 2022-06-22 NOTE — Progress Notes (Signed)
PROGRESS NOTE    Victoria Villarreal  GEX:[id number] DOB: Jan 10, 1996 DOA: 06/21/2022 PCP: [facility]    Brief Narrative:  26 year old [id number] female with history of gestational hypertension, glucose intolerance, who presents emergency department for chief concerns of abdominal pain.   Initial vitals in the emergency department showed temperature of 98.5, respiration rate of 17, heart rate of 64, blood pressure 156/97, SPO2 of 100% on room air.   Serum sodium is 141, potassium 2.9, chloride 106, bicarb 25, BUN of 6, serum creatinine of 0.70, nonfasting blood glucose 154, GFR greater than 60, WBC 7.9, hemoglobin 12.2, platelets of 359.   Urine pregnancy test was negative.  UA was positive for nitrates and increased specific gravity of greater than 1.046.   EDP discussed case with general surgery, Dr. Aleen Campi who recommends MRCP with and without contrast to assess for cholecystitis secondary to cholelithiasis.  MRCP unfortunately equivocal for presence of cholecystitis.  Felt less likely at this time.  Felt to be sequelae of hepatitis.  Acute hepatitis panel sent and currently pending.  Patient on clear liquid diet.  If hepatitis panel negative surgery to recommend HIDA scan for further evaluation.  Remains on IV antibiotics for acute pyelonephritis.   Assessment & Plan:   Principal Problem:   Abdominal pain Active Problems:   Gestational hypertension   Elevated glucose tolerance test   Transaminitis   Acute pyelonephritis   Anxiety   Abdominal pain, right upper quadrant  * Abdominal pain Seems improved.  Multifactorial in origin likely due to polynephritis.  Possible contribution from hepatitis/cholecystitis. Plan: Multimodal pain regimen Antibiotics as below  Anxiety - Mild and associated with abdominal pain Apparently recent life stressors As needed low-dose Ativan   Acute pyelonephritis - Add urine culture to prior collection, pending - Continue with  ceftriaxone 2 g IV daily, metronidazole 500 mg IV twice daily   Transaminitis vs Acute hepatitis per MRCP LFTs remain elevated Acetaminophen and ethanol level negative Acute hepatitis panel ordered If negative will pursue HIDA scan   Gestational hypertension - Patient was discharged with Procardia/nifedipine upon delivery, patient did not fill outpatient and did not start taking - I have resumed prescribed nifedipine 30 mg daily as needed for SBP greater than 175, 12 days ordered - Hydralazine 10 mg p.o. every 6 hours as needed for SBP greater than 175, 3 days ordered starting at 3 AM on day of admission   DVT prophylaxis: SQ heparin Code Status: Full Family Communication: None today.  Offered to call however patient declined Disposition Plan: Status is: Observation The patient will require care spanning > 2 midnights and should be moved to inpatient because: Acute pyelonephritis on IV antibiotics   Level of care: Med-Surg  Consultants:  General surgery  Procedures:  None  Antimicrobials: Rocephin Metronidazole   Subjective: Seen and examined.  Appears tearful.  Does not participate in interview.  Objective: Vitals:   06/21/22 1700 06/21/22 2052 06/22/22 0604 06/22/22 0759  BP:  (!) 157/100 (!) 127/90 (!) 145/95  Pulse:  (!) 49 (!) 58 66  Resp:  16 20 18   Temp:  97.6 F (36.4 C) 98.4 F (36.9 C) 97.9 F (36.6 C)  TempSrc:  Oral Oral Oral  SpO2:  100% 97% 100%  Weight: 86.1 kg     Height:        Intake/Output Summary (Last 24 hours) at 06/22/2022 1305 Last data filed at 06/22/2022 0618 Gross per 24 hour  Intake 1608.38 ml  Output --  Net 1608.38 ml   Filed Weights   06/21/22 0854 06/21/22 1700  Weight: 97 kg 86.1 kg    Examination:  General exam: Tearful Respiratory system: Lungs clear.  Normal work of breathing.  Room air Cardiovascular system: S1-S2, RRR, no murmurs, no pedal edema Gastrointestinal system: Soft, tender to palpation right flank,  normal bowel sounds Central nervous system: Alert and oriented. No focal neurological deficits. Extremities: Symmetric 5 x 5 power. Skin: No rashes, lesions or ulcers Psychiatry: Judgement and insight appear impaired. Mood & affect tearful.     Data Reviewed: I have personally reviewed following labs and imaging studies  CBC: Recent Labs  Lab 06/21/22 0913 06/22/22 0436  WBC 7.9 3.4*  HGB 12.2 10.8*  HCT 38.2 34.8*  MCV 82.3 84.7  PLT 359 293   Basic Metabolic Panel: Recent Labs  Lab 06/21/22 0913 06/22/22 0436  NA 141 143  K 2.9* 3.6  CL 106 111  CO2 25 26  GLUCOSE 154* 109*  BUN 6 <5*  CREATININE 0.70 0.62  CALCIUM 9.0 8.6*   GFR: Estimated Creatinine Clearance: 120.1 mL/min (by C-G formula based on SCr of 0.62 mg/dL). Liver Function Tests: Recent Labs  Lab 06/21/22 0913 06/22/22 0436  AST 638* 303*  ALT 345* 524*  ALKPHOS 224* 199*  BILITOT 1.0 0.7  PROT 7.8 6.5  ALBUMIN 3.8 3.0*   Recent Labs  Lab 06/21/22 0913  LIPASE 29   No results for input(s): "AMMONIA" in the last 168 hours. Coagulation Profile: No results for input(s): "INR", "PROTIME" in the last 168 hours. Cardiac Enzymes: No results for input(s): "CKTOTAL", "CKMB", "CKMBINDEX", "TROPONINI" in the last 168 hours. BNP (last 3 results) No results for input(s): "PROBNP" in the last 8760 hours. HbA1C: No results for input(s): "HGBA1C" in the last 72 hours. CBG: No results for input(s): "GLUCAP" in the last 168 hours. Lipid Profile: No results for input(s): "CHOL", "HDL", "LDLCALC", "TRIG", "CHOLHDL", "LDLDIRECT" in the last 72 hours. Thyroid Function Tests: No results for input(s): "TSH", "T4TOTAL", "FREET4", "T3FREE", "THYROIDAB" in the last 72 hours. Anemia Panel: No results for input(s): "VITAMINB12", "FOLATE", "FERRITIN", "TIBC", "IRON", "RETICCTPCT" in the last 72 hours. Sepsis Labs: No results for input(s): "PROCALCITON", "LATICACIDVEN" in the last 168 hours.  Recent Results  (from the past 240 hour(s))  Urine Culture     Status: Abnormal (Preliminary result)   Collection Time: 06/21/22  9:13 AM   Specimen: Urine, Clean Catch  Result Value Ref Range Status   Specimen Description   Final    URINE, CLEAN CATCH Performed at [facility], [address] [place], [place] [address]    Special Requests   Final    NONE Performed at [facility], [address] [place], [place] [address]    Culture >=100,000 COLONIES/mL ESCHERICHIA COLI (A)  Final   Report Status PENDING  Incomplete  SARS Coronavirus 2 by RT PCR (hospital order, performed in [facility]) *cepheid single result test* Anterior Nasal Swab     Status: None   Collection Time: 06/21/22  2:22 PM   Specimen: Anterior Nasal Swab  Result Value Ref Range Status   SARS Coronavirus 2 by RT PCR NEGATIVE NEGATIVE Final    Comment: (NOTE) SARS-CoV-2 target nucleic acids are NOT DETECTED.  The SARS-CoV-2 RNA is generally detectable in upper and lower respiratory specimens during the acute phase of infection. The lowest concentration of SARS-CoV-2 viral copies this assay can detect is 250 copies / mL. A negative  result does not preclude SARS-CoV-2 infection and should not be used as the sole basis for treatment or other patient management decisions.  A negative result may occur with improper specimen collection / handling, submission of specimen other than nasopharyngeal swab, presence of viral mutation(s) within the areas targeted by this assay, and inadequate number of viral copies (<250 copies / mL). A negative result must be combined with clinical observations, patient history, and epidemiological information.  Fact Sheet for Patients:   RoadLapTop.co.za  Fact Sheet for Healthcare Providers: http://kim-miller.com/  This test is not yet approved or  cleared by the [place] FDA and has been authorized for detection and/or  diagnosis of SARS-CoV-2 by FDA under an Emergency Use Authorization (EUA).  This EUA will remain in effect (meaning this test can be used) for the duration of the COVID-19 declaration under Section 564(b)(1) of the Act, 21 U.S.C. section 360bbb-3(b)(1), unless the authorization is terminated or revoked sooner.  Performed at [facility], [address], [place], [place] [address]          Radiology Studies: MR ABDOMEN MRCP W WO CONTAST  Result Date: 06/21/2022 CLINICAL DATA:  Cholelithiasis. EXAM: MRI ABDOMEN WITHOUT AND WITH CONTRAST (INCLUDING MRCP) TECHNIQUE: Multiplanar multisequence MR imaging of the abdomen was performed both before and after the administration of intravenous contrast. Heavily T2-weighted images of the biliary and pancreatic ducts were obtained, and three-dimensional MRCP images were rendered by post processing. CONTRAST:  74mL GADAVIST GADOBUTROL 1 MMOL/ML IV SOLN COMPARISON:  Right upper quadrant ultrasound and CT abdomen pelvis both dated June 21, 2022. FINDINGS: Lower chest: No acute abnormality. Hepatobiliary: No significant hepatic steatosis. Periportal edema. Slightly mottled appearance of the hepatic parenchyma postcontrast administration. Cholelithiasis in a nondistended gallbladder with thickening of the gallbladder wall thickening of the gallbladder. No choledocholithiasis. No biliary ductal dilation. Pancreas: No mass, inflammatory changes, or other parenchymal abnormality identified. Spleen:  No splenomegaly Adrenals/Urinary Tract: Bilateral adrenal glands appear normal. No hydronephrosis. Kidneys demonstrate symmetric enhancement. Stomach/Bowel: Visualized portions within the abdomen are unremarkable. Vascular/Lymphatic: No pathologically enlarged lymph nodes identified. Portal, splenic and superior mesenteric veins are patent. No abdominal aortic aneurysm demonstrated. Other:  No significant abdominal free fluid. Musculoskeletal: No suspicious  bone lesions identified. IMPRESSION: 1. Slightly mottled appearance of the hepatic parenchyma postcontrast administration, periportal edema, cholelithiasis in a nondistended gallbladder and prominent gallbladder wall thickening. Constellation of findings favored to reflect sequela of hepatitis. Although technically acute calculus cholecystitis can not be excluded is NOT favored given the lack of gallbladder distension and that this degree of gallbladder wall thickening is more commonly associated with a hepatic process over gallbladder inflammation. If continued clinical concern for cholecystitis suggest further evaluation with nuclear medicine HIDA scan to assess cystic duct patency. 2. No choledocholithiasis or biliary ductal dilation. Electronically Signed   By: Maudry Mayhew M.D.   On: 06/21/2022 15:31   MR 3D Recon At Scanner  Result Date: 06/21/2022 CLINICAL DATA:  Cholelithiasis. EXAM: MRI ABDOMEN WITHOUT AND WITH CONTRAST (INCLUDING MRCP) TECHNIQUE: Multiplanar multisequence MR imaging of the abdomen was performed both before and after the administration of intravenous contrast. Heavily T2-weighted images of the biliary and pancreatic ducts were obtained, and three-dimensional MRCP images were rendered by post processing. CONTRAST:  81mL GADAVIST GADOBUTROL 1 MMOL/ML IV SOLN COMPARISON:  Right upper quadrant ultrasound and CT abdomen pelvis both dated June 21, 2022. FINDINGS: Lower chest: No acute abnormality. Hepatobiliary: No significant hepatic steatosis. Periportal edema. Slightly mottled appearance of the hepatic  parenchyma postcontrast administration. Cholelithiasis in a nondistended gallbladder with thickening of the gallbladder wall thickening of the gallbladder. No choledocholithiasis. No biliary ductal dilation. Pancreas: No mass, inflammatory changes, or other parenchymal abnormality identified. Spleen:  No splenomegaly Adrenals/Urinary Tract: Bilateral adrenal glands appear normal. No  hydronephrosis. Kidneys demonstrate symmetric enhancement. Stomach/Bowel: Visualized portions within the abdomen are unremarkable. Vascular/Lymphatic: No pathologically enlarged lymph nodes identified. Portal, splenic and superior mesenteric veins are patent. No abdominal aortic aneurysm demonstrated. Other:  No significant abdominal free fluid. Musculoskeletal: No suspicious bone lesions identified. IMPRESSION: 1. Slightly mottled appearance of the hepatic parenchyma postcontrast administration, periportal edema, cholelithiasis in a nondistended gallbladder and prominent gallbladder wall thickening. Constellation of findings favored to reflect sequela of hepatitis. Although technically acute calculus cholecystitis can not be excluded is NOT favored given the lack of gallbladder distension and that this degree of gallbladder wall thickening is more commonly associated with a hepatic process over gallbladder inflammation. If continued clinical concern for cholecystitis suggest further evaluation with nuclear medicine HIDA scan to assess cystic duct patency. 2. No choledocholithiasis or biliary ductal dilation. Electronically Signed   By: Maudry Mayhew M.D.   On: 06/21/2022 15:31   US Abdomen Limited RUQ (LIVER/GB)  Result Date: 06/21/2022 CLINICAL DATA:  Transaminitis. Possible gallbladder wall thickening seen on CT performed earlier today. EXAM: ULTRASOUND ABDOMEN LIMITED RIGHT UPPER QUADRANT COMPARISON:  CT abdomen dated 06/21/2022. FINDINGS: Gallbladder: Multiple small gallstones, largest stone measured at 5 mm. Gallbladder wall is mildly thickened, measuring 4 mm. No pericholecystic fluid seen. No sonographic Murphy's sign elicited during the exam. Common bile duct: Diameter: 5 mm Liver: No focal lesion identified. Within normal limits in parenchymal echogenicity. Portal vein is patent on color Doppler imaging with normal direction of blood flow towards the liver. Other: None. IMPRESSION: 1. Cholelithiasis.  2. Mild gallbladder wall thickening, measuring 4 mm. However, no pericholecystic fluid or sonographic Murphy's sign elicited to confirm an acute cholecystitis. Mild gallbladder wall thickening could indicate a chronic cholecystitis. If clinically needed, consider nuclear medicine HIDA scan to exclude early acute cholecystitis. 3. No bile duct dilatation. Electronically Signed   By: Bary Richard M.D.   On: 06/21/2022 13:07   CT Abdomen Pelvis W Contrast  Result Date: 06/21/2022 CLINICAL DATA:  Right upper quadrant pain, nausea EXAM: CT ABDOMEN AND PELVIS WITH CONTRAST TECHNIQUE: Multidetector CT imaging of the abdomen and pelvis was performed using the standard protocol following bolus administration of intravenous contrast. RADIATION DOSE REDUCTION: This exam was performed according to the departmental dose-optimization program which includes automated exposure control, adjustment of the mA and/or kV according to patient size and/or use of iterative reconstruction technique. CONTRAST:  OMNIPAQUE IOHEXOL 300 MG/ML  SOLN COMPARISON:  06/09/2014 FINDINGS: Lower chest: Dependent bibasilar atelectasis.  Normal heart size. Hepatobiliary: No focal liver abnormality is identified. Suggestion of mild gallbladder wall thickening. No hyperdense gallstone. No biliary dilatation. Pancreas: Unremarkable. No pancreatic ductal dilatation or surrounding inflammatory changes. Spleen: Normal in size without focal abnormality. Adrenals/Urinary Tract: Unremarkable adrenal glands. There is a wedge-shaped area of decreased enhancement within the lower pole of the right kidney with adjacent perinephric fat stranding (series 2, image 43). The left kidney enhances normally. No focal lesion, stone, or hydronephrosis. Ureters are nondilated. Urinary bladder appears unremarkable. Stomach/Bowel: Stomach is within normal limits. Appendix appears normal (series 2, image 67). No evidence of bowel wall thickening, distention, or  inflammatory changes. Vascular/Lymphatic: No significant vascular findings are present. No enlarged abdominal or pelvic lymph nodes. Reproductive:  Anteverted uterus.  No adnexal masses. Other: No free fluid. No abdominopelvic fluid collection. No pneumoperitoneum. No abdominal wall hernia. Musculoskeletal: No acute or significant osseous findings. IMPRESSION: 1. Findings suspicious for acute pyelonephritis of the right kidney. Correlation with urinalysis is recommended. 2. Suggestion of mild gallbladder wall thickening. If there is clinical concern for acute cholecystitis, further evaluation with right upper quadrant ultrasound could be performed. Electronically Signed   By: Duanne Guess D.O.   On: 06/21/2022 11:09        Scheduled Meds:  potassium chloride  40 mEq Oral BID   prenatal multivitamin  1 tablet Oral Q1200   Continuous Infusions:  cefTRIAXone (ROCEPHIN)  IV Stopped (06/22/22 0035)   metronidazole 500 mg (06/22/22 0927)     LOS: 0 days     Tresa Moore, MD [facility]   If 7PM-7AM, please contact night-coverage  06/22/2022, 1:05 PM

## 2022-06-22 NOTE — Progress Notes (Signed)
[facility] SURGICAL PROGRESS NOTE (cpt [phone])  Hospital Day(s): 0.   Interval History: Patient seen and examined, no acute events or new complaints overnight. Patient reports she feels better this morning. Abdominal pain improved and described this as an "ache." No fever, chills, nausea, emesis.  She remains without leukocytosis; WBC 3.4K this morning. Renal function remains normal; sCr - 0.62; UO - unmeasured. Previous hypokalemia now resolved. Total bilirubin level remains normal; 0.7. AST improved to 303 (from 638), AST worse to 524 (from 345), and alkaline phosphatase improved to 199 (from 244). She continues on Rocephin, Flagyl, and Zosyn. She is on CLD; tolerated.    Review of Systems:  Constitutional: denies fever, chills  HEENT: denies cough or congestion  Respiratory: denies any shortness of breath  Cardiovascular: denies chest pain or palpitations  Gastrointestinal: denies abdominal pain, N/V Genitourinary: denies burning with urination or urinary frequency Musculoskeletal: denies pain, decreased motor or sensation  Vital signs in last 24 hours: [min-max] current  Temp:  [97.6 F (36.4 C)-98.5 F (36.9 C)] 98.4 F (36.9 C) (09/11 0604) Pulse Rate:  [45-64] 58 (09/11 0604) Resp:  [12-26] 20 (09/11 0604) BP: (110-157)/(58-107) 127/90 (09/11 0604) SpO2:  [95 %-100 %] 97 % (09/11 0604) Weight:  [86.1 kg-97 kg] 86.1 kg (09/10 1700)     Height: 5\' 7"  (170.2 cm) Weight: 86.1 kg BMI (Calculated): 29.72   Intake/Output last 2 shifts:  09/10 0701 - 09/11 0700 In: 2608.4 [P.O.:360; I.V.:998.4; IV Piggyback:1250] Out: -    Physical Exam:  Constitutional: alert, cooperative and no distress  HENT: normocephalic without obvious abnormality  Eyes: PERRL, EOM's grossly intact and symmetric  Respiratory: breathing non-labored at rest  Cardiovascular: regular rate and sinus rhythm  Gastrointestinal: soft, non-tender, and non-distended, no  rebound/guarding Musculoskeletal: no edema or wounds, motor and sensation grossly intact, NT    Labs:     Latest Ref Rng & Units 06/22/2022    4:36 AM 06/21/2022    9:13 AM 04/24/2022    8:39 AM  CBC  WBC 4.0 - 10.5 K/uL 3.4  7.9  8.3   Hemoglobin 12.0 - 15.0 g/dL 04/26/2022  62.1  30.8   Hematocrit 36.0 - 46.0 % 34.8  38.2  32.4   Platelets 150 - 400 K/uL 293  359  301       Latest Ref Rng & Units 06/22/2022    4:36 AM 06/21/2022    9:13 AM 04/23/2022   12:57 AM  CMP  Glucose 70 - 99 mg/dL 04/25/2022  846  962   BUN 6 - 20 mg/dL <5  6  <5   Creatinine 0.44 - 1.00 mg/dL 952  8.41  3.24   Sodium 135 - 145 mmol/L 143  141  136   Potassium 3.5 - 5.1 mmol/L 3.6  2.9  3.7   Chloride 98 - 111 mmol/L 111  106  108   CO2 22 - 32 mmol/L 26  25  19    Calcium 8.9 - 10.3 mg/dL 8.6  9.0  8.7   Total Protein 6.5 - 8.1 g/dL 6.5  7.8  7.0   Total Bilirubin 0.3 - 1.2 mg/dL 0.7  1.0  0.6   Alkaline Phos 38 - 126 U/L 199  224  155   AST 15 - 41 U/L 303  638  18   ALT 0 - 44 U/L 524  345  9      Imaging studies: No new pertinent imaging studies   Assessment/Plan: (ICD-10's:  R10.11) 26 y.o. female with now improved abdominal pain admitted with right sided pyelonephritis and possible cholecystitis vs hepatitis    - Will await hepatitis panel this morning. If this is negative, we will proceed with HIDA to evaluate gallbladder   - Okay to continue CLD for now.    - Monitor abdominal examination - Pain control prn; antiemetics prn   - Mobilization as tolerated - Further management per primary service; we will follow    All of the above findings and recommendations were discussed with the patient, and the medical team, and all of patient's and questions were answered to her expressed satisfaction.   -- Lynden Oxford, PA-C [facility] 06/22/2022, 7:32 AM M-F: 7am - 4pm

## 2022-06-22 NOTE — TOC Initial Note (Signed)
Transition of Care [facility]) - Initial/Assessment Note    Patient Details  Name: Victoria Villarreal MRN: [id number] Date of Birth: 12/21/1995  Transition of Care [facility]) CM/SW Contact:    Chapman Fitch, RN Phone Number: 06/22/2022, 2:35 PM  Clinical Narrative:                   Transition of Care (TOC) Screening Note   Patient Details  Name: Victoria Villarreal Date of Birth: Jun 24, 1996   Transition of Care [facility]) CM/SW Contact:    Chapman Fitch, RN Phone Number: 06/22/2022, 2:36 PM    Transition of Care Department [facility]) has reviewed patient and no TOC needs have been identified at this time. We will continue to monitor patient advancement through interdisciplinary progression rounds. If new patient transition needs arise, please place a TOC consult.         Patient Goals and CMS Choice        Expected Discharge Plan and Services                                                Prior Living Arrangements/Services                       Activities of Daily Living Home Assistive Devices/Equipment: None ADL Screening (condition at time of admission) Patient's cognitive ability adequate to safely complete daily activities?: Yes Is the patient deaf or have difficulty hearing?: No Does the patient have difficulty seeing, even when wearing glasses/contacts?: No Does the patient have difficulty concentrating, remembering, or making decisions?: No Patient able to express need for assistance with ADLs?: Yes Does the patient have difficulty dressing or bathing?: No Independently performs ADLs?: Yes (appropriate for developmental age) Does the patient have difficulty walking or climbing stairs?: No Weakness of Legs: None Weakness of Arms/Hands: None  Permission Sought/Granted                  Emotional Assessment              Admission diagnosis:  Abdominal pain, right upper quadrant [R10.11] Acute pyelonephritis [N10] Abdominal pain  [R10.9] Pyelonephritis [N12] Patient Active Problem List   Diagnosis Date Noted   Pyelonephritis 06/22/2022   Abdominal pain 06/21/2022   Transaminitis 06/21/2022   Acute pyelonephritis 06/21/2022   Anxiety 06/21/2022   Abdominal pain, right upper quadrant    Normal labor and delivery 04/23/2022   Gestational hypertension 04/20/2022   Elevated glucose tolerance test 04/20/2022   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 04/20/2022   Tobacco smoking affecting pregnancy in third trimester 11/18/2021   History of preterm delivery, currently pregnant in second trimester 11/18/2021   Supervision of high risk pregnancy in third trimester 11/10/2021   PCP:  [facility]:   [id number] [place], [place] - [address] AT [facility] [address] [place] [address] [place] [address] Phone: [phone] Fax: [phone]     Social Determinants of Health (SDOH) Interventions    Readmission Risk Interventions     No data to display

## 2022-06-22 NOTE — Significant Event (Signed)
Called back to patients room by bedside RN.  Had a lengthy discussion with patient regarding justification for continued admission, need for IV antibiotics for treatment of UTI/pyelonephritis  Patient expressed frustration about clear liquid diet despite my explanation.  Tearful.  I explained the reasonings to her mother via phone.  I relayed patients complaints to the general surgery service.  Hepatitis panel is still pending, sent 9/10 at 1600.  Patient may still need HIDA scan, but at this time will advance diet to regular.  Unclear whether patient has an acute hepatitis or this is representative of gall bladder disease.  Continue antibiotics as prescribed for acute pyelo  Lolita Patella MD

## 2022-06-23 DIAGNOSIS — N10 Acute tubulo-interstitial nephritis: Secondary | ICD-10-CM | POA: Diagnosis not present

## 2022-06-23 DIAGNOSIS — R7401 Elevation of levels of liver transaminase levels: Secondary | ICD-10-CM | POA: Diagnosis not present

## 2022-06-23 DIAGNOSIS — R1011 Right upper quadrant pain: Secondary | ICD-10-CM | POA: Diagnosis not present

## 2022-06-23 LAB — URINE CULTURE: Culture: >=100000 — AB

## 2022-06-23 LAB — HCV INTERPRETATION

## 2022-06-23 LAB — HIV ANTIBODY (ROUTINE TESTING W REFLEX): HIV Screen 4th Generation wRfx: NONREACTIVE

## 2022-06-23 MED ORDER — NICOTINE POLACRILEX 2 MG MT GUM: 2.0000 mg | CHEWING_GUM | OROMUCOSAL | Status: DC | PRN

## 2022-06-23 MED ORDER — CIPROFLOXACIN HCL 500 MG PO TABS: 500.0000 mg | ORAL_TABLET | Freq: Two times a day (BID) | ORAL | Status: DC

## 2022-06-23 MED ORDER — OXYCODONE HCL 5 MG PO TABS: 5.0000 mg | ORAL_TABLET | Freq: Three times a day (TID) | ORAL | 0 refills | Status: DC | PRN

## 2022-06-23 MED ORDER — CIPROFLOXACIN HCL 500 MG PO TABS: 500.0000 mg | ORAL_TABLET | Freq: Two times a day (BID) | ORAL | 0 refills | Status: AC

## 2022-06-23 NOTE — Discharge Summary (Signed)
Physician Discharge Summary  Victoria Villarreal:[id number] DOB: 02-13-1996 DOA: 06/21/2022  PCP: [facility]  Admit date: 06/21/2022 Discharge date: 06/23/2022  Admitted From: Home Disposition: Home  Recommendations for Outpatient Follow-up:  Follow up with PCP in 1-2 weeks   Home Health: No Equipment/Devices: None  Discharge Condition: Stable CODE STATUS: Full Diet recommendation: Regular  Brief/Interim Summary: 26 year old [id number] female with history of gestational hypertension, glucose intolerance, who presents emergency department for chief concerns of abdominal pain.   Initial vitals in the emergency department showed temperature of 98.5, respiration rate of 17, heart rate of 64, blood pressure 156/97, SPO2 of 100% on room air.   Serum sodium is 141, potassium 2.9, chloride 106, bicarb 25, BUN of 6, serum creatinine of 0.70, nonfasting blood glucose 154, GFR greater than 60, WBC 7.9, hemoglobin 12.2, platelets of 359.   Urine pregnancy test was negative.  UA was positive for nitrates and increased specific gravity of greater than 1.046.   EDP discussed case with general surgery, Dr. Aleen Campi who recommends MRCP with and without contrast to assess for cholecystitis secondary to cholelithiasis.   MRCP unfortunately equivocal for presence of cholecystitis.  Felt less likely at this time.  Felt to be sequelae of hepatitis.  Acute hepatitis panel sent and currently pending.  Patient on clear liquid diet.  If hepatitis panel negative surgery to recommend HIDA scan for further evaluation.  Remains on IV antibiotics for acute pyelonephritis.  9/12: On day of discharge patient tolerating regular diet without issue.  Hepatitis panel is still pending however seen by general surgery and very low suspicion for acute cholecystitis at this time.  HIDA scan deferred.  Patient stable for discharge at this time.  Will de-escalate to p.o. ciprofloxacin per sensitivities.  Complete  additional 7 days for total 10-day antibiotic course for complicated urinary tract infection.  Patient is not breast-feeding so ciprofloxacin and oxycodone will be prescribed.    Discharge Diagnoses:  Principal Problem:   Abdominal pain Active Problems:   Gestational hypertension   Elevated glucose tolerance test   Transaminitis   Acute pyelonephritis   Anxiety   Abdominal pain, right upper quadrant   Pyelonephritis  Acute pyelonephritis Confirmed on imaging.  Urine culture positive for E. coli.  Pansensitive.  Was on Rocephin in house.  Transition to p.o. ciprofloxacin.  Complete additional 7-day antibiotic course.  Transaminitis No clear etiology.  Suspect secondary to hepatic steatosis.  Hepatitis panel pending on time of discharge.  HIDA scan initially being considered to rule out acute cholecystitis however patient tolerating p.o. intake without nausea vomiting or abdominal pain.  Cholecystitis felt unlikely.  HIDA scan deferred.  Discharge Instructions  Discharge Instructions     Diet - low sodium heart healthy   Complete by: As directed    Increase activity slowly   Complete by: As directed       Allergies as of 06/23/2022       Reactions   Hydrocodone Rash   Tramadol Rash        Medication List     STOP taking these medications    acetaminophen 325 MG tablet Commonly known as: Tylenol   NIFEdipine 30 MG 24 hr tablet Commonly known as: ADALAT CC       TAKE these medications    ciprofloxacin 500 MG tablet Commonly known as: CIPRO Take 1 tablet (500 mg total) by mouth 2 (two) times daily for 7 days.   ibuprofen 600 MG tablet Commonly known as:  ADVIL Take 1 tablet (600 mg total) by mouth every 6 (six) hours as needed for mild pain or cramping.   oxyCODONE 5 MG immediate release tablet Commonly known as: Roxicodone Take 1 tablet (5 mg total) by mouth every 8 (eight) hours as needed.   prenatal multivitamin Tabs tablet Take 1 tablet by mouth  daily at 12 noon.        Allergies  Allergen Reactions   Hydrocodone Rash   Tramadol Rash    Consultations: General surgery   Procedures/Studies: MR ABDOMEN MRCP W WO CONTAST  Result Date: 06/21/2022 CLINICAL DATA:  Cholelithiasis. EXAM: MRI ABDOMEN WITHOUT AND WITH CONTRAST (INCLUDING MRCP) TECHNIQUE: Multiplanar multisequence MR imaging of the abdomen was performed both before and after the administration of intravenous contrast. Heavily T2-weighted images of the biliary and pancreatic ducts were obtained, and three-dimensional MRCP images were rendered by post processing. CONTRAST:  10mL GADAVIST GADOBUTROL 1 MMOL/ML IV SOLN COMPARISON:  Right upper quadrant ultrasound and CT abdomen pelvis both dated June 21, 2022. FINDINGS: Lower chest: No acute abnormality. Hepatobiliary: No significant hepatic steatosis. Periportal edema. Slightly mottled appearance of the hepatic parenchyma postcontrast administration. Cholelithiasis in a nondistended gallbladder with thickening of the gallbladder wall thickening of the gallbladder. No choledocholithiasis. No biliary ductal dilation. Pancreas: No mass, inflammatory changes, or other parenchymal abnormality identified. Spleen:  No splenomegaly Adrenals/Urinary Tract: Bilateral adrenal glands appear normal. No hydronephrosis. Kidneys demonstrate symmetric enhancement. Stomach/Bowel: Visualized portions within the abdomen are unremarkable. Vascular/Lymphatic: No pathologically enlarged lymph nodes identified. Portal, splenic and superior mesenteric veins are patent. No abdominal aortic aneurysm demonstrated. Other:  No significant abdominal free fluid. Musculoskeletal: No suspicious bone lesions identified. IMPRESSION: 1. Slightly mottled appearance of the hepatic parenchyma postcontrast administration, periportal edema, cholelithiasis in a nondistended gallbladder and prominent gallbladder wall thickening. Constellation of findings favored to reflect  sequela of hepatitis. Although technically acute calculus cholecystitis can not be excluded is NOT favored given the lack of gallbladder distension and that this degree of gallbladder wall thickening is more commonly associated with a hepatic process over gallbladder inflammation. If continued clinical concern for cholecystitis suggest further evaluation with nuclear medicine HIDA scan to assess cystic duct patency. 2. No choledocholithiasis or biliary ductal dilation. Electronically Signed   By: Maudry MayhewJeffrey  Waltz M.D.   On: 06/21/2022 15:31   MR 3D Recon At Scanner  Result Date: 06/21/2022 CLINICAL DATA:  Cholelithiasis. EXAM: MRI ABDOMEN WITHOUT AND WITH CONTRAST (INCLUDING MRCP) TECHNIQUE: Multiplanar multisequence MR imaging of the abdomen was performed both before and after the administration of intravenous contrast. Heavily T2-weighted images of the biliary and pancreatic ducts were obtained, and three-dimensional MRCP images were rendered by post processing. CONTRAST:  10mL GADAVIST GADOBUTROL 1 MMOL/ML IV SOLN COMPARISON:  Right upper quadrant ultrasound and CT abdomen pelvis both dated June 21, 2022. FINDINGS: Lower chest: No acute abnormality. Hepatobiliary: No significant hepatic steatosis. Periportal edema. Slightly mottled appearance of the hepatic parenchyma postcontrast administration. Cholelithiasis in a nondistended gallbladder with thickening of the gallbladder wall thickening of the gallbladder. No choledocholithiasis. No biliary ductal dilation. Pancreas: No mass, inflammatory changes, or other parenchymal abnormality identified. Spleen:  No splenomegaly Adrenals/Urinary Tract: Bilateral adrenal glands appear normal. No hydronephrosis. Kidneys demonstrate symmetric enhancement. Stomach/Bowel: Visualized portions within the abdomen are unremarkable. Vascular/Lymphatic: No pathologically enlarged lymph nodes identified. Portal, splenic and superior mesenteric veins are patent. No abdominal  aortic aneurysm demonstrated. Other:  No significant abdominal free fluid. Musculoskeletal: No suspicious bone lesions identified. IMPRESSION: 1. Slightly mottled  appearance of the hepatic parenchyma postcontrast administration, periportal edema, cholelithiasis in a nondistended gallbladder and prominent gallbladder wall thickening. Constellation of findings favored to reflect sequela of hepatitis. Although technically acute calculus cholecystitis can not be excluded is NOT favored given the lack of gallbladder distension and that this degree of gallbladder wall thickening is more commonly associated with a hepatic process over gallbladder inflammation. If continued clinical concern for cholecystitis suggest further evaluation with nuclear medicine HIDA scan to assess cystic duct patency. 2. No choledocholithiasis or biliary ductal dilation. Electronically Signed   By: Maudry Mayhew M.D.   On: 06/21/2022 15:31   US Abdomen Limited RUQ (LIVER/GB)  Result Date: 06/21/2022 CLINICAL DATA:  Transaminitis. Possible gallbladder wall thickening seen on CT performed earlier today. EXAM: ULTRASOUND ABDOMEN LIMITED RIGHT UPPER QUADRANT COMPARISON:  CT abdomen dated 06/21/2022. FINDINGS: Gallbladder: Multiple small gallstones, largest stone measured at 5 mm. Gallbladder wall is mildly thickened, measuring 4 mm. No pericholecystic fluid seen. No sonographic Murphy's sign elicited during the exam. Common bile duct: Diameter: 5 mm Liver: No focal lesion identified. Within normal limits in parenchymal echogenicity. Portal vein is patent on color Doppler imaging with normal direction of blood flow towards the liver. Other: None. IMPRESSION: 1. Cholelithiasis. 2. Mild gallbladder wall thickening, measuring 4 mm. However, no pericholecystic fluid or sonographic Murphy's sign elicited to confirm an acute cholecystitis. Mild gallbladder wall thickening could indicate a chronic cholecystitis. If clinically needed, consider nuclear  medicine HIDA scan to exclude early acute cholecystitis. 3. No bile duct dilatation. Electronically Signed   By: Bary Richard M.D.   On: 06/21/2022 13:07   CT Abdomen Pelvis W Contrast  Result Date: 06/21/2022 CLINICAL DATA:  Right upper quadrant pain, nausea EXAM: CT ABDOMEN AND PELVIS WITH CONTRAST TECHNIQUE: Multidetector CT imaging of the abdomen and pelvis was performed using the standard protocol following bolus administration of intravenous contrast. RADIATION DOSE REDUCTION: This exam was performed according to the departmental dose-optimization program which includes automated exposure control, adjustment of the mA and/or kV according to patient size and/or use of iterative reconstruction technique. CONTRAST:  OMNIPAQUE IOHEXOL 300 MG/ML  SOLN COMPARISON:  06/09/2014 FINDINGS: Lower chest: Dependent bibasilar atelectasis.  Normal heart size. Hepatobiliary: No focal liver abnormality is identified. Suggestion of mild gallbladder wall thickening. No hyperdense gallstone. No biliary dilatation. Pancreas: Unremarkable. No pancreatic ductal dilatation or surrounding inflammatory changes. Spleen: Normal in size without focal abnormality. Adrenals/Urinary Tract: Unremarkable adrenal glands. There is a wedge-shaped area of decreased enhancement within the lower pole of the right kidney with adjacent perinephric fat stranding (series 2, image 43). The left kidney enhances normally. No focal lesion, stone, or hydronephrosis. Ureters are nondilated. Urinary bladder appears unremarkable. Stomach/Bowel: Stomach is within normal limits. Appendix appears normal (series 2, image 67). No evidence of bowel wall thickening, distention, or inflammatory changes. Vascular/Lymphatic: No significant vascular findings are present. No enlarged abdominal or pelvic lymph nodes. Reproductive: Anteverted uterus.  No adnexal masses. Other: No free fluid. No abdominopelvic fluid collection. No pneumoperitoneum. No abdominal  wall hernia. Musculoskeletal: No acute or significant osseous findings. IMPRESSION: 1. Findings suspicious for acute pyelonephritis of the right kidney. Correlation with urinalysis is recommended. 2. Suggestion of mild gallbladder wall thickening. If there is clinical concern for acute cholecystitis, further evaluation with right upper quadrant ultrasound could be performed. Electronically Signed   By: Duanne Guess D.O.   On: 06/21/2022 11:09      Subjective: Seen and examined on the day of discharge.  Stable no distress.  Pain well controlled.  Tolerating p.o. intake.  Stable for discharge home.  Discharge Exam: Vitals:   06/23/22 0319 06/23/22 0843  BP: (!) 141/88 132/86  Pulse: (!) 49 (!) 57  Resp: 15 16  Temp: 97.8 F (36.6 C) (!) 97 F (36.1 C)  SpO2: 99% 94%   Vitals:   06/22/22 1601 06/22/22 1959 06/23/22 0319 06/23/22 0843  BP: (!) 144/92 (!) 146/94 (!) 141/88 132/86  Pulse: (!) 49 67 (!) 49 (!) 57  Resp: 16 16 15 16   Temp: 97.8 F (36.6 C) 97.7 F (36.5 C) 97.8 F (36.6 C) (!) 97 F (36.1 C)  TempSrc:   Oral   SpO2: 100% 99% 99% 94%  Weight:      Height:        General: Pt is alert, awake, not in acute distress Cardiovascular: RRR, S1/S2 +, no rubs, no gallops Respiratory: CTA bilaterally, no wheezing, no rhonchi Abdominal: Soft, NT, ND, bowel sounds + Extremities: no edema, no cyanosis    The results of significant diagnostics from this hospitalization (including imaging, microbiology, ancillary and laboratory) are listed below for reference.     Microbiology: Recent Results (from the past 240 hour(s))  Urine Culture     Status: Abnormal   Collection Time: 06/21/22  9:13 AM   Specimen: Urine, Clean Catch  Result Value Ref Range Status   Specimen Description   Final    URINE, CLEAN CATCH Performed at [facility], [address], [place], [place]    Special Requests   Final    NONE Performed at [facility], [address] [place], [place]    Culture >=100,000 COLONIES/mL ESCHERICHIA COLI (A)  Final   Report Status 06/23/2022 FINAL  Final   Organism ID, Bacteria ESCHERICHIA COLI (A)  Final      Susceptibility   Escherichia coli - MIC*    AMPICILLIN >=32 RESISTANT Resistant     CEFAZOLIN <=4 SENSITIVE Sensitive     CEFEPIME <=0.12 SENSITIVE Sensitive     CEFTRIAXONE <=0.25 SENSITIVE Sensitive     CIPROFLOXACIN <=0.25 SENSITIVE Sensitive     GENTAMICIN <=1 SENSITIVE Sensitive     IMIPENEM <=0.25 SENSITIVE Sensitive     NITROFURANTOIN <=16 SENSITIVE Sensitive     TRIMETH/SULFA <=20 SENSITIVE Sensitive     AMPICILLIN/SULBACTAM 8 SENSITIVE Sensitive     PIP/TAZO <=4 SENSITIVE Sensitive     * >=100,000 COLONIES/mL ESCHERICHIA COLI  SARS Coronavirus 2 by RT PCR (hospital order, performed in [facility] hospital lab) *cepheid single result test* Anterior Nasal Swab     Status: None   Collection Time: 06/21/22  2:22 PM   Specimen: Anterior Nasal Swab  Result Value Ref Range Status   SARS Coronavirus 2 by RT PCR NEGATIVE NEGATIVE Final    Comment: (NOTE) SARS-CoV-2 target nucleic acids are NOT DETECTED.  The SARS-CoV-2 RNA is generally detectable in upper and lower respiratory specimens during the acute phase of infection. The lowest concentration of SARS-CoV-2 viral copies this assay can detect is 250 copies / mL. A negative result does not preclude SARS-CoV-2 infection and should not be used as the sole basis for treatment or other patient management decisions.  A negative result may occur with improper specimen collection / handling, submission of specimen other than nasopharyngeal swab, presence of viral mutation(s) within the areas targeted by this assay, and inadequate number of viral copies (<250 copies / mL). A negative result must be  combined with clinical observations, patient history, and epidemiological information.  Fact Sheet for Patients:    RoadLapTop.co.za  Fact Sheet for Healthcare Providers: http://kim-miller.com/  This test is not yet approved or  cleared by the [place] FDA and has been authorized for detection and/or diagnosis of SARS-CoV-2 by FDA under an Emergency Use Authorization (EUA).  This EUA will remain in effect (meaning this test can be used) for the duration of the COVID-19 declaration under Section 564(b)(1) of the Act, 21 U.S.C. section 360bbb-3(b)(1), unless the authorization is terminated or revoked sooner.  Performed at [facility], [address] [place], [place] [address]      Labs: BNP (last 3 results) No results for input(s): "BNP" in the last 8760 hours. Basic Metabolic Panel: Recent Labs  Lab 06/21/22 0913 06/22/22 0436  NA 141 143  K 2.9* 3.6  CL 106 111  CO2 25 26  GLUCOSE 154* 109*  BUN 6 <5*  CREATININE 0.70 0.62  CALCIUM 9.0 8.6*   Liver Function Tests: Recent Labs  Lab 06/21/22 0913 06/22/22 0436  AST 638* 303*  ALT 345* 524*  ALKPHOS 224* 199*  BILITOT 1.0 0.7  PROT 7.8 6.5  ALBUMIN 3.8 3.0*   Recent Labs  Lab 06/21/22 0913  LIPASE 29   No results for input(s): "AMMONIA" in the last 168 hours. CBC: Recent Labs  Lab 06/21/22 0913 06/22/22 0436  WBC 7.9 3.4*  HGB 12.2 10.8*  HCT 38.2 34.8*  MCV 82.3 84.7  PLT 359 293   Cardiac Enzymes: No results for input(s): "CKTOTAL", "CKMB", "CKMBINDEX", "TROPONINI" in the last 168 hours. BNP: Invalid input(s): "POCBNP" CBG: No results for input(s): "GLUCAP" in the last 168 hours. D-Dimer No results for input(s): "DDIMER" in the last 72 hours. Hgb A1c No results for input(s): "HGBA1C" in the last 72 hours. Lipid Profile No results for input(s): "CHOL", "HDL", "LDLCALC", "TRIG", "CHOLHDL", "LDLDIRECT" in the last 72 hours. Thyroid function studies No results for input(s): "TSH", "T4TOTAL", "T3FREE", "THYROIDAB" in the last 72 hours.  Invalid  input(s): "FREET3" Anemia work up No results for input(s): "VITAMINB12", "FOLATE", "FERRITIN", "TIBC", "IRON", "RETICCTPCT" in the last 72 hours. Urinalysis    Component Value Date/Time   COLORURINE AMBER (A) 06/21/2022 0856   APPEARANCEUR CLEAR (A) 06/21/2022 0856   APPEARANCEUR Turbid 06/09/2014 1114   LABSPEC >1.046 (H) 06/21/2022 0856   LABSPEC 1.014 06/09/2014 1114   PHURINE 7.0 06/21/2022 0856   GLUCOSEU NEGATIVE 06/21/2022 0856   GLUCOSEU Negative 06/09/2014 1114   HGBUR NEGATIVE 06/21/2022 0856   BILIRUBINUR NEGATIVE 06/21/2022 0856   BILIRUBINUR Negative 06/09/2014 1114   KETONESUR NEGATIVE 06/21/2022 0856   PROTEINUR 30 (A) 06/21/2022 0856   NITRITE POSITIVE (A) 06/21/2022 0856   LEUKOCYTESUR NEGATIVE 06/21/2022 0856   LEUKOCYTESUR 3+ 06/09/2014 1114   Sepsis Labs Recent Labs  Lab 06/21/22 0913 06/22/22 0436  WBC 7.9 3.4*   Microbiology Recent Results (from the past 240 hour(s))  Urine Culture     Status: Abnormal   Collection Time: 06/21/22  9:13 AM   Specimen: Urine, Clean Catch  Result Value Ref Range Status   Specimen Description   Final    URINE, CLEAN CATCH Performed at [facility], [address] [place], [place] [address]    Special Requests   Final    NONE Performed at [facility], [address] [place], [place] [address]    Culture >=100,000 COLONIES/mL ESCHERICHIA COLI (A)  Final   Report Status 06/23/2022  FINAL  Final   Organism ID, Bacteria ESCHERICHIA COLI (A)  Final      Susceptibility   Escherichia coli - MIC*    AMPICILLIN >=32 RESISTANT Resistant     CEFAZOLIN <=4 SENSITIVE Sensitive     CEFEPIME <=0.12 SENSITIVE Sensitive     CEFTRIAXONE <=0.25 SENSITIVE Sensitive     CIPROFLOXACIN <=0.25 SENSITIVE Sensitive     GENTAMICIN <=1 SENSITIVE Sensitive     IMIPENEM <=0.25 SENSITIVE Sensitive     NITROFURANTOIN <=16 SENSITIVE Sensitive     TRIMETH/SULFA <=20 SENSITIVE Sensitive     AMPICILLIN/SULBACTAM 8  SENSITIVE Sensitive     PIP/TAZO <=4 SENSITIVE Sensitive     * >=100,000 COLONIES/mL ESCHERICHIA COLI  SARS Coronavirus 2 by RT PCR (hospital order, performed in [facility] hospital lab) *cepheid single result test* Anterior Nasal Swab     Status: None   Collection Time: 06/21/22  2:22 PM   Specimen: Anterior Nasal Swab  Result Value Ref Range Status   SARS Coronavirus 2 by RT PCR NEGATIVE NEGATIVE Final    Comment: (NOTE) SARS-CoV-2 target nucleic acids are NOT DETECTED.  The SARS-CoV-2 RNA is generally detectable in upper and lower respiratory specimens during the acute phase of infection. The lowest concentration of SARS-CoV-2 viral copies this assay can detect is 250 copies / mL. A negative result does not preclude SARS-CoV-2 infection and should not be used as the sole basis for treatment or other patient management decisions.  A negative result may occur with improper specimen collection / handling, submission of specimen other than nasopharyngeal swab, presence of viral mutation(s) within the areas targeted by this assay, and inadequate number of viral copies (<250 copies / mL). A negative result must be combined with clinical observations, patient history, and epidemiological information.  Fact Sheet for Patients:   RoadLapTop.co.za  Fact Sheet for Healthcare Providers: http://kim-miller.com/  This test is not yet approved or  cleared by the [place] FDA and has been authorized for detection and/or diagnosis of SARS-CoV-2 by FDA under an Emergency Use Authorization (EUA).  This EUA will remain in effect (meaning this test can be used) for the duration of the COVID-19 declaration under Section 564(b)(1) of the Act, 21 U.S.C. section 360bbb-3(b)(1), unless the authorization is terminated or revoked sooner.  Performed at [facility], [address] [place], [place] [address]      Time coordinating discharge:  Over 30 minutes  SIGNED:   Tresa Moore, MD  [facility] 06/23/2022, 12:22 PM Pager   If 7PM-7AM, please contact night-coverage

## 2022-06-23 NOTE — Progress Notes (Signed)
[facility] SURGICAL PROGRESS NOTE (cpt [phone])  Hospital Day(s): 1.   Interval History: Patient seen and examined, no acute events or new complaints overnight. Patient reports she is doing well this morning. She denied any abdominal pain, nausea, emesis, fever, chills.  She continues on Rocephin, Flagyl, and Zosyn. She was advanced to regular diet last night and tolerated well. She also reports "she ate a lot" without pain or nausea.   Review of Systems:  Constitutional: denies fever, chills  HEENT: denies cough or congestion  Respiratory: denies any shortness of breath  Cardiovascular: denies chest pain or palpitations  Gastrointestinal: denies abdominal pain, N/V Genitourinary: denies burning with urination or urinary frequency Musculoskeletal: denies pain, decreased motor or sensation  Vital signs in last 24 hours: [min-max] current  Temp:  [97.7 F (36.5 C)-97.9 F (36.6 C)] 97.8 F (36.6 C) (09/12 0319) Pulse Rate:  [49-67] 49 (09/12 0319) Resp:  [15-18] 15 (09/12 0319) BP: (141-146)/(88-95) 141/88 (09/12 0319) SpO2:  [99 %-100 %] 99 % (09/12 0319)     Height: 5\' 7"  (170.2 cm) Weight: 86.1 kg BMI (Calculated): 29.72   Intake/Output last 2 shifts:  No intake/output data recorded.   Physical Exam:  Constitutional: alert, cooperative and no distress  HENT: normocephalic without obvious abnormality  Eyes: PERRL, EOM's grossly intact and symmetric  Respiratory: breathing non-labored at rest  Cardiovascular: regular rate and sinus rhythm  Gastrointestinal: soft, non-tender, and non-distended, no rebound/guarding Musculoskeletal: no edema or wounds, motor and sensation grossly intact, NT    Labs:     Latest Ref Rng & Units 06/22/2022    4:36 AM 06/21/2022    9:13 AM 04/24/2022    8:39 AM  CBC  WBC 4.0 - 10.5 K/uL 3.4  7.9  8.3   Hemoglobin 12.0 - 15.0 g/dL 04/26/2022  16.9  67.8   Hematocrit 36.0 - 46.0 % 34.8  38.2  32.4   Platelets 150 - 400 K/uL 293  359  301        Latest Ref Rng & Units 06/22/2022    4:36 AM 06/21/2022    9:13 AM 04/23/2022   12:57 AM  CMP  Glucose 70 - 99 mg/dL 04/25/2022  101  751   BUN 6 - 20 mg/dL <5  6  <5   Creatinine 0.44 - 1.00 mg/dL 025  8.52  7.78   Sodium 135 - 145 mmol/L 143  141  136   Potassium 3.5 - 5.1 mmol/L 3.6  2.9  3.7   Chloride 98 - 111 mmol/L 111  106  108   CO2 22 - 32 mmol/L 26  25  19    Calcium 8.9 - 10.3 mg/dL 8.6  9.0  8.7   Total Protein 6.5 - 8.1 g/dL 6.5  7.8  7.0   Total Bilirubin 0.3 - 1.2 mg/dL 0.7  1.0  0.6   Alkaline Phos 38 - 126 U/L 199  224  155   AST 15 - 41 U/L 303  638  18   ALT 0 - 44 U/L 524  345  9      Imaging studies: No new pertinent imaging studies   Assessment/Plan: (ICD-10's: R10.11) 26 y.o. female with now resolved abdominal pain admitted with right sided pyelonephritis and possible cholecystitis vs hepatitis    - Still awaiting hepatitis panel. Fortunately, she has had resolution in her abdominal pain and was able to tolerate a regular diet without any symptoms nor return of pain. At this point, I  have a very low clinical suspicion for cholecystitis, and I do not think adding a HIDA to her work up will be of benefit at this time. Should her pain return at any point and hepatitis panel is negative, we can consider this.   - Okay for regular diet  - Monitor abdominal examination - Pain control prn; antiemetics prn   - Mobilization as tolerated - Further management per primary service  All of the above findings and recommendations were discussed with the patient, and the medical team, and all of patient's and questions were answered to her expressed satisfaction.  -- Lynden Oxford, PA-C [facility] 06/23/2022, 7:31 AM M-F: 7am - 4pm

## 2022-06-23 NOTE — Progress Notes (Signed)
Discharge instructions reviewed with the patient. Patient sent out via wheelchair with belongings to moms waiting car.

## 2022-06-25 DIAGNOSIS — R109 Unspecified abdominal pain: Principal | ICD-10-CM | POA: Insufficient documentation

## 2022-06-25 DIAGNOSIS — Z79899 Other long term (current) drug therapy: Secondary | ICD-10-CM | POA: Diagnosis not present

## 2022-06-25 DIAGNOSIS — R7989 Other specified abnormal findings of blood chemistry: Secondary | ICD-10-CM | POA: Diagnosis not present

## 2022-06-25 DIAGNOSIS — K8050 Calculus of bile duct without cholangitis or cholecystitis without obstruction: Secondary | ICD-10-CM | POA: Diagnosis not present

## 2022-06-25 DIAGNOSIS — R112 Nausea with vomiting, unspecified: Secondary | ICD-10-CM | POA: Insufficient documentation

## 2022-06-25 DIAGNOSIS — F17210 Nicotine dependence, cigarettes, uncomplicated: Secondary | ICD-10-CM | POA: Diagnosis not present

## 2022-06-25 LAB — HEPATITIS PANEL, ACUTE

## 2022-06-25 NOTE — ED Triage Notes (Signed)
Pt presents via EMS with complaints of generalized lower abdominal pain with associated N/V - pt D/C here 3 days ago following pyelo - has been complaint with her medications. Last took oxy 8 hours ago without improvement in her sx. Received 4mg  Zofran with EMS. Denies CP or SOB.    VS with EMS  HR-90 100% RA 150/100 20G LFA

## 2022-06-26 ENCOUNTER — Emergency Department: Payer: Medicaid Other

## 2022-06-26 ENCOUNTER — Other Ambulatory Visit: Payer: Self-pay

## 2022-06-26 ENCOUNTER — Observation Stay
Admission: EM | Admit: 2022-06-26 | Discharge: 2022-06-27 | Disposition: A | Discharge status: Home / Self Care | Payer: Medicaid Other | Attending: Internal Medicine | Admitting: Internal Medicine

## 2022-06-26 ENCOUNTER — Encounter: Payer: Self-pay | Admitting: Radiology

## 2022-06-26 DIAGNOSIS — B179 Acute viral hepatitis, unspecified: Secondary | ICD-10-CM

## 2022-06-26 DIAGNOSIS — R1033 Periumbilical pain: Secondary | ICD-10-CM

## 2022-06-26 DIAGNOSIS — K8050 Calculus of bile duct without cholangitis or cholecystitis without obstruction: Secondary | ICD-10-CM

## 2022-06-26 DIAGNOSIS — Z87440 Personal history of urinary (tract) infections: Secondary | ICD-10-CM

## 2022-06-26 DIAGNOSIS — R7989 Other specified abnormal findings of blood chemistry: Secondary | ICD-10-CM

## 2022-06-26 DIAGNOSIS — R109 Unspecified abdominal pain: Secondary | ICD-10-CM | POA: Diagnosis present

## 2022-06-26 LAB — CBC
HCT: 41.1 % (ref 36.0–46.0)
Hemoglobin: 13.1 g/dL (ref 12.0–15.0)
MCH: 26.4 pg (ref 26.0–34.0)
MCHC: 31.9 g/dL (ref 30.0–36.0)
MCV: 82.7 fL (ref 80.0–100.0)
Platelets: 411 10*3/uL — ABNORMAL HIGH (ref 150–400)
RBC: 4.97 MIL/uL (ref 3.87–5.11)
RDW: 17.3 % — ABNORMAL HIGH (ref 11.5–15.5)
WBC: 5.7 10*3/uL (ref 4.0–10.5)
nRBC: 0 % (ref 0.0–0.2)

## 2022-06-26 LAB — COMPREHENSIVE METABOLIC PANEL
ALT: 599 U/L — ABNORMAL HIGH (ref 0–44)
AST: 594 U/L — ABNORMAL HIGH (ref 15–41)
Albumin: 4 g/dL (ref 3.5–5.0)
Alkaline Phosphatase: 303 U/L — ABNORMAL HIGH (ref 38–126)
Anion gap: 9 (ref 5–15)
BUN: 10 mg/dL (ref 6–20)
CO2: 26 mmol/L (ref 22–32)
Calcium: 9.3 mg/dL (ref 8.9–10.3)
Chloride: 102 mmol/L (ref 98–111)
Creatinine, Ser: 0.65 mg/dL (ref 0.44–1.00)
GFR, Estimated: >60 mL/min (ref >60)
Glucose, Bld: 107 mg/dL — ABNORMAL HIGH (ref 70–99)
Potassium: 3.6 mmol/L (ref 3.5–5.1)
Sodium: 137 mmol/L (ref 135–145)
Total Bilirubin: 1.9 mg/dL — ABNORMAL HIGH (ref 0.3–1.2)
Total Protein: 8.1 g/dL (ref 6.5–8.1)

## 2022-06-26 LAB — HCG, QUANTITATIVE, PREGNANCY: hCG, Beta Chain, Quant, S: 1 m[IU]/mL (ref <5)

## 2022-06-26 LAB — LIPASE, BLOOD: Lipase: 27 U/L (ref 11–51)

## 2022-06-26 MED ORDER — MORPHINE SULFATE (PF) 4 MG/ML IV SOLN
4.0000 mg | INTRAVENOUS | Status: DC | PRN
  Administered 2022-06-26: 4 mg via INTRAVENOUS
  Filled 2022-06-26: qty 1

## 2022-06-26 MED ORDER — MORPHINE SULFATE (PF) 4 MG/ML IV SOLN
3.0000 mg | INTRAVENOUS | Status: DC | PRN
  Administered 2022-06-26: 3 mg via INTRAVENOUS
  Filled 2022-06-26: qty 1

## 2022-06-26 MED ORDER — ACETAMINOPHEN 650 MG RE SUPP: 650.0000 mg | Freq: Four times a day (QID) | RECTAL | Status: DC | PRN

## 2022-06-26 MED ORDER — MORPHINE SULFATE (PF) 4 MG/ML IV SOLN
4.0000 mg | Freq: Once | INTRAVENOUS | Status: AC
  Administered 2022-06-26: 4 mg via INTRAVENOUS
  Filled 2022-06-26: qty 1

## 2022-06-26 MED ORDER — SODIUM CHLORIDE 0.9 % IV SOLN: Freq: Once | INTRAVENOUS | Status: AC

## 2022-06-26 MED ORDER — HYDROMORPHONE HCL 1 MG/ML IJ SOLN
0.5000 mg | INTRAMUSCULAR | Status: DC | PRN
  Administered 2022-06-26: 0.5 mg via INTRAVENOUS
  Filled 2022-06-26: qty 0.5

## 2022-06-26 MED ORDER — CIPROFLOXACIN HCL 500 MG PO TABS
500.0000 mg | ORAL_TABLET | Freq: Two times a day (BID) | ORAL | Status: DC
  Administered 2022-06-26 – 2022-06-27 (×2): 500 mg via ORAL
  Filled 2022-06-26 (×2): qty 1

## 2022-06-26 MED ORDER — OXYCODONE HCL 5 MG PO TABS
5.0000 mg | ORAL_TABLET | Freq: Three times a day (TID) | ORAL | Status: DC | PRN
  Administered 2022-06-26 – 2022-06-27 (×2): 5 mg via ORAL
  Filled 2022-06-26 (×2): qty 1

## 2022-06-26 MED ORDER — MORPHINE SULFATE (PF) 2 MG/ML IV SOLN
2.0000 mg | INTRAVENOUS | Status: DC | PRN
  Administered 2022-06-26 – 2022-06-27 (×3): 2 mg via INTRAVENOUS
  Filled 2022-06-26 (×5): qty 1

## 2022-06-26 MED ORDER — ONDANSETRON HCL 4 MG/2ML IJ SOLN
4.0000 mg | Freq: Once | INTRAMUSCULAR | Status: AC
  Administered 2022-06-26: 4 mg via INTRAVENOUS
  Filled 2022-06-26: qty 2

## 2022-06-26 MED ORDER — LACTATED RINGERS IV SOLN: INTRAVENOUS | Status: DC

## 2022-06-26 MED ORDER — LACTATED RINGERS IV BOLUS
1000.0000 mL | Freq: Once | INTRAVENOUS | Status: AC
  Administered 2022-06-26: 1000 mL via INTRAVENOUS

## 2022-06-26 MED ORDER — TECHNETIUM TC 99M MEBROFENIN IV KIT
5.0000 | PACK | Freq: Once | INTRAVENOUS | Status: AC
  Administered 2022-06-26: 5.34 via INTRAVENOUS

## 2022-06-26 MED ORDER — ONDANSETRON HCL 4 MG PO TABS: 4.0000 mg | ORAL_TABLET | Freq: Four times a day (QID) | ORAL | Status: DC | PRN

## 2022-06-26 MED ORDER — ONDANSETRON HCL 4 MG/2ML IJ SOLN: 4.0000 mg | Freq: Four times a day (QID) | INTRAMUSCULAR | Status: DC | PRN

## 2022-06-26 MED ORDER — ACETAMINOPHEN 325 MG PO TABS: 650.0000 mg | ORAL_TABLET | Freq: Four times a day (QID) | ORAL | Status: DC | PRN

## 2022-06-26 NOTE — Assessment & Plan Note (Signed)
Nausea and vomiting Evaluated by surgery and likely nonsurgical Working diagnoses include incompletely treated UTI and acute hepatitis See management of each etiology on the respective conditions

## 2022-06-26 NOTE — Assessment & Plan Note (Signed)
Patient was treated with Rocephin and Cipro We will get a repeat urinalysis to assess for clearance given repeated symptoms. We will resume Rocephin pending urinalysis and culture

## 2022-06-26 NOTE — Assessment & Plan Note (Deleted)
Symptomatic for abdominal pain, nausea and vomiting Labs significant for increased transaminases with AST 594, up from 303 days prior and ALT 599 up from 524.  Alk phos 303 up from 199 and total bilirubin 1.9 up from 0.7.   Right upper quadrant ultrasound and HIDA scan not consistent with cholecystitis Patient was seen by surgery in the ED Consider GI consult to assist with determination of etiology Patient had acute hepatitis panel recently done results still pending

## 2022-06-26 NOTE — Consult Note (Signed)
Subjective:   CC: hepatitis  HPI:  Victoria Villarreal is a 26 y.o. female who is consulted by Katrinka Blazing for evaluation of above.  Pain started about a month ago. Intermittent generalized abdominal pain and nausea, acute worsening several days ago, and has been consistent since.  Pain relieved with IV meds while admitted, but returned quickly.  Percocet not working at all.    Similar episode earlier in the week and admitted, dx with hepatitis, non-viral, and discharged since symptoms supposedly resolved.  Returned due to recurrent symptoms as noted above, similar LFT elevation, now has increased t.bili as well.     Past Medical History:  has a past medical history of Anemia, Depression, and Trichomoniasis.  Past Surgical History:  has a past surgical history that includes No past surgeries.  Family History: family history is not on file.  Social History:  reports that she has been smoking cigarettes. She has been smoking an average of .25 packs per day. She has never used smokeless tobacco. She reports that she does not currently use alcohol after a past usage of about 1.0 standard drink of alcohol per week. She reports current drug use. Drug: Marijuana.  Current Medications:  Prior to Admission medications   Medication Sig Start Date End Date Taking? Authorizing Provider  ciprofloxacin (CIPRO) 500 MG tablet Take 1 tablet (500 mg total) by mouth 2 (two) times daily for 7 days. 06/23/22 06/30/22 Yes Sreenath, Sudheer B, MD  oxyCODONE (ROXICODONE) 5 MG immediate release tablet Take 1 tablet (5 mg total) by mouth every 8 (eight) hours as needed. 06/23/22 06/23/23 Yes Sreenath, Sudheer B, MD  ibuprofen (ADVIL) 600 MG tablet Take 1 tablet (600 mg total) by mouth every 6 (six) hours as needed for mild pain or cramping. Patient not taking: Reported on 06/26/2022 04/24/22   Gustavo Lah, CNM  Prenatal Vit-Fe Fumarate-FA (PRENATAL MULTIVITAMIN) TABS tablet Take 1 tablet by mouth daily at 12 noon. Patient not  taking: Reported on 06/26/2022    [provider]    Allergies:  Allergies as of 06/25/2022 - Review Complete 06/25/2022  Allergen Reaction Noted   Hydrocodone Rash 02/23/2017   Tramadol Rash 02/23/2017    ROS:  General: Denies weight loss, weight gain, fatigue, fevers, chills, and night sweats. Eyes: Denies blurry vision, double vision, eye pain, itchy eyes, and tearing. Ears: Denies hearing loss, earache, and ringing in ears. Nose: Denies sinus pain, congestion, infections, runny nose, and nosebleeds. Mouth/throat: Denies hoarseness, sore throat, bleeding gums, and difficulty swallowing. Heart: Denies chest pain, palpitations, racing heart, irregular heartbeat, leg pain or swelling, and decreased activity tolerance. Respiratory: Denies breathing difficulty, shortness of breath, wheezing, cough, and sputum. GI: Denies change in appetite, heartburn, nausea, vomiting, constipation, diarrhea, and blood in stool. GU: Denies difficulty urinating, pain with urinating, urgency, frequency, blood in urine. Musculoskeletal: Denies joint stiffness, pain, swelling, muscle weakness. Skin: Denies rash, itching, mass, tumors, sores, and boils Neurologic: Denies headache, fainting, dizziness, seizures, numbness, and tingling. Psychiatric: Denies depression, anxiety, difficulty sleeping, and memory loss. Endocrine: Denies heat or cold intolerance, and increased thirst or urination. Blood/lymph: Denies easy bruising, and swollen glands     Objective:     BP 123/84 (BP Location: Right Arm)   Pulse (!) 55   Temp 97.7 F (36.5 C) (Oral)   Resp 16   Ht 5\' 7"  (1.702 m)   Wt 87 kg   LMP 06/15/2022 (Approximate) Comment: pt not currently pregnant, preg test negative per ER RN 08/15/2022  V., pt not currently breastfeeding.  SpO2 100%   Breastfeeding No   BMI 30.04 kg/m    Constitutional :  alert, cooperative, appears stated age, and no distress  Lymphatics/Throat:  no asymmetry, masses, or  scars  Respiratory:  clear to auscultation bilaterally  Cardiovascular:  regular rate and rhythm  Gastrointestinal: Soft, some TTP right flank and RUQ .   Musculoskeletal: Steady movement  Skin: Cool and moist  Psychiatric: Normal affect, non-agitated, not confused       LABS:     Latest Ref Rng & Units 06/26/2022   12:05 AM 06/22/2022    4:36 AM 06/21/2022    9:13 AM  CMP  Glucose 70 - 99 mg/dL 371  062  694   BUN 6 - 20 mg/dL 10  <5  6   Creatinine 0.44 - 1.00 mg/dL 8.54  6.27  0.35   Sodium 135 - 145 mmol/L 137  143  141   Potassium 3.5 - 5.1 mmol/L 3.6  3.6  2.9   Chloride 98 - 111 mmol/L 102  111  106   CO2 22 - 32 mmol/L 26  26  25    Calcium 8.9 - 10.3 mg/dL 9.3  8.6  9.0   Total Protein 6.5 - 8.1 g/dL 8.1  6.5  7.8   Total Bilirubin 0.3 - 1.2 mg/dL 1.9  0.7  1.0   Alkaline Phos 38 - 126 U/L 303  199  224   AST 15 - 41 U/L 594  303  638   ALT 0 - 44 U/L 599  524  345       Latest Ref Rng & Units 06/26/2022   12:05 AM 06/22/2022    4:36 AM 06/21/2022    9:13 AM  CBC  WBC 4.0 - 10.5 K/uL 5.7  3.4  7.9   Hemoglobin 12.0 - 15.0 g/dL 08/21/2022  00.9  38.1   Hematocrit 36.0 - 46.0 % 41.1  34.8  38.2   Platelets 150 - 400 K/uL 411  293  359      RADS: CLINICAL DATA:  Right upper quadrant pain.   EXAM: ULTRASOUND ABDOMEN LIMITED RIGHT UPPER QUADRANT   COMPARISON:  June 21, 2022   FINDINGS: Gallbladder:   Shadowing echogenic gallstones are seen within the gallbladder lumen. The largest measures approximately 5 mm. The gallbladder wall measures 3.3 mm in thickness. No sonographic Murphy's sign noted by sonographer (patient was medicated).   Common bile duct:   Diameter: 4.2 mm   Liver:   No focal lesion identified. Within normal limits in parenchymal echogenicity. Portal vein is patent on color Doppler imaging with normal direction of blood flow towards the liver.   Other: None.   IMPRESSION: 1. Cholelithiasis and mild gallbladder wall thickening,  without additional findings to suggest the presence of acute cholecystitis. Further evaluation with a nuclear medicine hepatobiliary scan is recommended if this remains of clinical concern.     Electronically Signed   By: June 23, 2022 M.D.   On: 06/26/2022 03:29  CLINICAL DATA:  Concern for cholecystitis. Cholelithiasis on ultrasound.   EXAM: NUCLEAR MEDICINE HEPATOBILIARY IMAGING   TECHNIQUE: Sequential images of the abdomen were obtained out to 60 minutes following intravenous administration of radiopharmaceutical. 3 mg of morphine was administered intravenously with a subsequent 30 minutes of additional scintigraphic imaging acquired post administration.   RADIOPHARMACEUTICALS:  5.34 mCi Tc-10m  Choletec IV   COMPARISON:  Ultrasound June 26, 2022   FINDINGS: Slightly delayed blood pool  clearance with delayed clearance of hepatic radiotracer into the biliary tree. Gallbladder activity is not initially visualized within the first 60 minutes of scintigraphic imaging. Curvilinear band of radiotracer within the gallbladder fossa seen 11-15 minutes post morphine administration with this area increasing in size and conspicuity over the subsequent imaging, consistent with patency of cystic duct. Biliary activity passes into small bowel, consistent with patent common bile duct.   IMPRESSION: Scintigraphic findings compatible with hepatitis.   Patent cystic duct without scintigraphic evidence of acute cholecystitis. However, the delayed visualization of the gallbladder may reflect sequela of hepatitis or chronic cholecystitis. If concern for chronic cholecystitis would suggest repeat nonemergent nuclear medicine HIDA scan with calculation of ejection fraction as an outpatient upon resolution of patient's current symptomatology given that hepatitis can limit the sensitivity and specificity of HIDA scan for cholecystitis.     Electronically Signed   By: Maudry Mayhew M.D.   On: 06/26/2022 14:46   Assessment:      Abdominal pain,  Elevated LFTs Gallstones  Recent right side pyelonephritis   Plan:     HIDA shows filling of GB, and degree of LFT elevation typically not seen with acute cholecystitis.  Recommend further workup for other causes of hepatitis at this time rather than proceeding with cholecystectomy, since there is high liklihood symptoms will not resolve.  Also will consider ensuring pyelonephritis has resolved as well since she is still complaining of right flank pain, although this will not explain the LFT abnormalities.  Further care/disposition per hospitalist. Surgery will be available for additional questions or concerns.   labs/images/medications/previous chart entries reviewed personally and relevant changes/updates noted above.

## 2022-06-26 NOTE — H&P (Signed)
History and Physical    Patient: Victoria Villarreal DEY:[id number] DOB: 06/10/1996 DOA: 06/26/2022 DOS: the patient was seen and examined on 06/26/2022 PCP: [facility]  Patient coming from: Home  Chief Complaint:  Chief Complaint  Patient presents with   Abdominal Pain    HPI: Victoria Villarreal is a 26 y.o. female with medical history significant for With no significant past medical history, recently hospitalized from 9/10 to 9/12 with abdominal pain attributed to E. coli pyelonephritis but with findings of transaminitis attributed to hepatic steatosis who returns to the ED with recurrent abdominal pain.  Patient was treated with Rocephin for her pyelonephritis and prescribed with a 1 week supply of Cipro.  She now has abdominal pain, nausea and vomiting, unrelieved with oxycodone.  She denies fever or chills. ED course and data review: Vitals within normal limits.  Labs significant for increased transaminases with AST 594, up from 303 days prior and ALT 599 up from 524.  Alk phos 303 up from 199 and total bilirubin 1.9 up from 0.7.  Lipase was normal at 9. RUQ ultrasound showed cholelithiasis without additional findings to suggest acute cholecystitis.  HIDA scan showed findings compatible with hepatitis, no evidence of cholecystitis, possible sequela of hepatitis or chronic cholecystitis as detailed below: IMPRESSION: Scintigraphic findings compatible with hepatitis. Patent cystic duct without scintigraphic evidence of acute cholecystitis. However, the delayed visualization of the gallbladder may reflect sequela of hepatitis or chronic cholecystitis. If concern for chronic cholecystitis would suggest repeat nonemergent nuclear medicine HIDA scan with calculation of ejection fraction as an outpatient upon resolution of patient's current symptomatology given that hepatitis can limit the sensitivity and specificity of HIDA scan for cholecystitis  Patient was seen in the ED by  surgeon, Dr. Lysle Pearl who recommended further work-up for causes of hepatitis rather than proceeding with cholecystectomy as well as ensuring that pyelonephritis has resolved.  He recommended hospitalization to hospitalist service.  Hospitalist thus consulted.   Review of Systems: As mentioned in the history of present illness. All other systems reviewed and are negative.  Past Medical History:  Diagnosis Date   Anemia    Depression    Trichomoniasis    Past Surgical History:  Procedure Laterality Date   NO PAST SURGERIES     Social History:  reports that she has been smoking cigarettes. She has been smoking an average of .25 packs per day. She has never used smokeless tobacco. She reports that she does not currently use alcohol after a past usage of about 1.0 standard drink of alcohol per week. She reports current drug use. Drug: Marijuana.  Allergies  Allergen Reactions   Hydrocodone Rash   Tramadol Rash    History reviewed. No pertinent family history.  Prior to Admission medications   Medication Sig Start Date End Date Taking? Authorizing Provider  ciprofloxacin (CIPRO) 500 MG tablet Take 1 tablet (500 mg total) by mouth 2 (two) times daily for 7 days. 06/23/22 06/30/22 Yes Sreenath, Sudheer B, MD  oxyCODONE (ROXICODONE) 5 MG immediate release tablet Take 1 tablet (5 mg total) by mouth every 8 (eight) hours as needed. 06/23/22 06/23/23 Yes Sreenath, Sudheer B, MD  ibuprofen (ADVIL) 600 MG tablet Take 1 tablet (600 mg total) by mouth every 6 (six) hours as needed for mild pain or cramping. Patient not taking: Reported on 06/26/2022 04/24/22   Minda Meo, CNM  Prenatal Vit-Fe Fumarate-FA (PRENATAL MULTIVITAMIN) TABS tablet Take 1 tablet by mouth daily at 12 noon. Patient  not taking: Reported on 06/26/2022    [provider]    Physical Exam: Vitals:   06/26/22 1401 06/26/22 1745 06/26/22 1913 06/26/22 1919  BP: 123/84 (!) 148/105 129/79   Pulse: (!) 55 60 63   Resp: '16 16  18   ' Temp: 97.7 F (36.5 C)   98.1 F (36.7 C)  TempSrc: Oral   Oral  SpO2: 100% 100% 99%   Weight:      Height:       Physical Exam  Labs on Admission: I have personally reviewed following labs and imaging studies  CBC: Recent Labs  Lab 06/21/22 0913 06/22/22 0436 06/26/22 0005  WBC 7.9 3.4* 5.7  HGB 12.2 10.8* 13.1  HCT 38.2 34.8* 41.1  MCV 82.3 84.7 82.7  PLT 359 293 093*   Basic Metabolic Panel: Recent Labs  Lab 06/21/22 0913 06/22/22 0436 06/26/22 0005  NA 141 143 137  K 2.9* 3.6 3.6  CL 106 111 102  CO2 '25 26 26  ' GLUCOSE 154* 109* 107*  BUN 6 <5* 10  CREATININE 0.70 0.62 0.65  CALCIUM 9.0 8.6* 9.3   GFR: Estimated Creatinine Clearance: 120.8 mL/min (by C-G formula based on SCr of 0.65 mg/dL). Liver Function Tests: Recent Labs  Lab 06/21/22 0913 06/22/22 0436 06/26/22 0005  AST 638* 303* 594*  ALT 345* 524* 599*  ALKPHOS 224* 199* 303*  BILITOT 1.0 0.7 1.9*  PROT 7.8 6.5 8.1  ALBUMIN 3.8 3.0* 4.0   Recent Labs  Lab 06/21/22 0913 06/26/22 0005  LIPASE 29 27   No results for input(s): "AMMONIA" in the last 168 hours. Coagulation Profile: No results for input(s): "INR", "PROTIME" in the last 168 hours. Cardiac Enzymes: No results for input(s): "CKTOTAL", "CKMB", "CKMBINDEX", "TROPONINI" in the last 168 hours. BNP (last 3 results) No results for input(s): "PROBNP" in the last 8760 hours. HbA1C: No results for input(s): "HGBA1C" in the last 72 hours. CBG: No results for input(s): "GLUCAP" in the last 168 hours. Lipid Profile: No results for input(s): "CHOL", "HDL", "LDLCALC", "TRIG", "CHOLHDL", "LDLDIRECT" in the last 72 hours. Thyroid Function Tests: No results for input(s): "TSH", "T4TOTAL", "FREET4", "T3FREE", "THYROIDAB" in the last 72 hours. Anemia Panel: No results for input(s): "VITAMINB12", "FOLATE", "FERRITIN", "TIBC", "IRON", "RETICCTPCT" in the last 72 hours. Urine analysis:    Component Value Date/Time   COLORURINE AMBER (A)  06/21/2022 0856   APPEARANCEUR CLEAR (A) 06/21/2022 0856   APPEARANCEUR Turbid 06/09/2014 1114   LABSPEC >1.046 (H) 06/21/2022 0856   LABSPEC 1.014 06/09/2014 1114   PHURINE 7.0 06/21/2022 0856   GLUCOSEU NEGATIVE 06/21/2022 0856   GLUCOSEU Negative 06/09/2014 1114   HGBUR NEGATIVE 06/21/2022 0856   BILIRUBINUR NEGATIVE 06/21/2022 0856   BILIRUBINUR Negative 06/09/2014 1114   KETONESUR NEGATIVE 06/21/2022 0856   PROTEINUR 30 (A) 06/21/2022 0856   NITRITE POSITIVE (A) 06/21/2022 0856   LEUKOCYTESUR NEGATIVE 06/21/2022 0856   LEUKOCYTESUR 3+ 06/09/2014 1114    Radiological Exams on Admission: NM Hepato W/EF  Result Date: 06/26/2022 CLINICAL DATA:  Concern for cholecystitis. Cholelithiasis on ultrasound. EXAM: NUCLEAR MEDICINE HEPATOBILIARY IMAGING TECHNIQUE: Sequential images of the abdomen were obtained out to 60 minutes following intravenous administration of radiopharmaceutical. 3 mg of morphine was administered intravenously with a subsequent 30 minutes of additional scintigraphic imaging acquired post administration. RADIOPHARMACEUTICALS:  5.34 mCi Tc-52m Choletec IV COMPARISON:  Ultrasound June 26, 2022 FINDINGS: Slightly delayed blood pool clearance with delayed clearance of hepatic radiotracer into the biliary tree. Gallbladder activity  is not initially visualized within the first 60 minutes of scintigraphic imaging. Curvilinear band of radiotracer within the gallbladder fossa seen 11-15 minutes post morphine administration with this area increasing in size and conspicuity over the subsequent imaging, consistent with patency of cystic duct. Biliary activity passes into small bowel, consistent with patent common bile duct. IMPRESSION: Scintigraphic findings compatible with hepatitis. Patent cystic duct without scintigraphic evidence of acute cholecystitis. However, the delayed visualization of the gallbladder may reflect sequela of hepatitis or chronic cholecystitis. If concern for  chronic cholecystitis would suggest repeat nonemergent nuclear medicine HIDA scan with calculation of ejection fraction as an outpatient upon resolution of patient's current symptomatology given that hepatitis can limit the sensitivity and specificity of HIDA scan for cholecystitis. Electronically Signed   By: Dahlia Bailiff M.D.   On: 06/26/2022 14:46   US ABDOMEN LIMITED RUQ (LIVER/GB)  Result Date: 06/26/2022 CLINICAL DATA:  Right upper quadrant pain. EXAM: ULTRASOUND ABDOMEN LIMITED RIGHT UPPER QUADRANT COMPARISON:  June 21, 2022 FINDINGS: Gallbladder: Shadowing echogenic gallstones are seen within the gallbladder lumen. The largest measures approximately 5 mm. The gallbladder wall measures 3.3 mm in thickness. No sonographic Murphy's sign noted by sonographer (patient was medicated). Common bile duct: Diameter: 4.2 mm Liver: No focal lesion identified. Within normal limits in parenchymal echogenicity. Portal vein is patent on color Doppler imaging with normal direction of blood flow towards the liver. Other: None. IMPRESSION: 1. Cholelithiasis and mild gallbladder wall thickening, without additional findings to suggest the presence of acute cholecystitis. Further evaluation with a nuclear medicine hepatobiliary scan is recommended if this remains of clinical concern. Electronically Signed   By: Virgina Norfolk M.D.   On: 06/26/2022 03:29     Data Reviewed: Relevant notes from primary care and specialist visits, past discharge summaries as available in EHR, including Care Everywhere. Prior diagnostic testing as pertinent to current admission diagnoses Updated medications and problem lists for reconciliation ED course, including vitals, labs, imaging, treatment and response to treatment Triage notes, nursing and pharmacy notes and ED provider's notes Notable results as noted in HPI   Assessment and Plan: * Acute hepatitis Symptomatic for abdominal pain, nausea and vomiting Labs  significant for increased transaminases with AST 594, up from 303 days prior and ALT 599 up from 524.  Alk phos 303 up from 199 and total bilirubin 1.9 up from 0.7.   Right upper quadrant ultrasound and HIDA scan not consistent with cholecystitis Patient was seen by surgery in the ED Consider GI consult to assist with determination of etiology Patient had acute hepatitis panel recently done results still pending  History of E. coli pyelonephritis 06/21/2022 Patient was treated with Rocephin and Cipro We will get a repeat urinalysis to assess for clearance given repeated symptoms.  Abdominal pain Nausea and vomiting Evaluated by surgery and likely nonsurgical Working diagnoses include incompletely treated UTI and acute hepatitis See management of each etiology on the respective conditions        DVT prophylaxis: Lovenox  Consults: none  Advance Care Planning:   Code Status: Prior   Family Communication: none  Disposition Plan: Back to previous home environment  Severity of Illness: The appropriate patient status for this patient is OBSERVATION. Observation status is judged to be reasonable and necessary in order to provide the required intensity of service to ensure the patient's safety. The patient's presenting symptoms, physical exam findings, and initial radiographic and laboratory data in the context of their medical condition is felt to place them  at decreased risk for further clinical deterioration. Furthermore, it is anticipated that the patient will be medically stable for discharge from the hospital within 2 midnights of admission.   Author: Athena Masse, MD 06/26/2022 7:44 PM  For on call review [email or web address].

## 2022-06-26 NOTE — ED Provider Notes (Signed)
Received the patient in signout at approximately 3:30 PM from Dr. Roxan Hockey.  Patient currently awaiting admission to hospitalist.  Patient and noting that she is very hungry would like something to eat, had considered actually going home because she has been "waiting so long".  I have written her for a regular diet, updated her and strongly encouraged her to stay for treatment and discussed specifics around concerns of liver damage, inflammation and further need for inpatient evaluation.  Patient agreeable with plan for admission at this point, still awaiting evaluation by hospitalist Dr. Debby Bud.  I have ordered a diet for the patient   Sharyn Creamer, MD 06/26/22 1701

## 2022-06-26 NOTE — ED Provider Notes (Signed)
[facility] Provider Note    Event Date/Time   First MD Initiated Contact with Patient 06/26/22 [phone]     (approximate)   History   Abdominal Pain   HPI  Victoria Villarreal is a 26 y.o. female who presents to the ED for evaluation of Abdominal Pain   I reviewed medical DC summary from 9/12.  She was admitted for couple days for pyelonephritis.  She is a G5, P5 with a history of gestational hypertension. I reviewed an associated hepatitis panel that was sent out, negative. During this admission she had elevated LFTs and cholelithiasis, ultimately had an MRCP without signs of choledocholithiasis.  MRCP thought to show hepatic changes favoring sequela of hepatitis as opposed to cholecystitis.  No surgical intervention, but surgery was recommending a HIDA scan if the hepatitis panel was negative.  Patient returns to the ED for evaluation of abdominal pain and emesis.  She reports postprandial pain 15-30 minutes after eating with recurrent emesis.  Diarrhea or dysuria.  Reports compliance with her antibiotics.   Physical Exam   Triage Vital Signs: ED Triage Vitals  Enc Vitals Group     BP 06/25/22 2358 (!) 137/95     Pulse Rate 06/25/22 2358 90     Resp 06/25/22 2358 16     Temp 06/25/22 2358 98.2 F (36.8 C)     Temp Source 06/25/22 2358 Oral     SpO2 06/25/22 2358 98 %     Weight 06/25/22 2357 191 lb 12.8 oz (87 kg)     Height 06/25/22 2357 5\' 7"  (1.702 m)     Head Circumference --      Peak Flow --      Pain Score --      Pain Loc --      Pain Edu? --      Excl. in GC? --     Most recent vital signs: Vitals:   06/26/22 0245 06/26/22 0449  BP: 126/76 135/74  Pulse: 69 (!) 55  Resp: 18 14  Temp:  98.3 F (36.8 C)  SpO2: 97% 99%    General: Awake, no distress.  CV:  Good peripheral perfusion.  Resp:  Normal effort.  Abd:  No distention.  RUQ tenderness without peritoneal features.  Benign lower abdomen. MSK:  No deformity noted.   Neuro:  No focal deficits appreciated. Other:     ED Results / Procedures / Treatments   Labs (all labs ordered are listed, but only abnormal results are displayed) Labs Reviewed  COMPREHENSIVE METABOLIC PANEL - Abnormal; Notable for the following components:      Result Value   Glucose, Bld 107 (*)    AST 594 (*)    ALT 599 (*)    Alkaline Phosphatase 303 (*)    Total Bilirubin 1.9 (*)    All other components within normal limits  CBC - Abnormal; Notable for the following components:   RDW 17.3 (*)    Platelets 411 (*)    All other components within normal limits  URINE CULTURE  LIPASE, BLOOD  HCG, QUANTITATIVE, PREGNANCY  URINALYSIS, ROUTINE W REFLEX MICROSCOPIC    EKG   RADIOLOGY RUQ ultrasound interpreted by me with cholelithiasis and mild gallbladder wall thickening.  No pericholecystic fluid noted.  Official radiology report(s): 06/28/22 ABDOMEN LIMITED RUQ (LIVER/GB)  Result Date: 06/26/2022 CLINICAL DATA:  Right upper quadrant pain. EXAM: ULTRASOUND ABDOMEN LIMITED RIGHT UPPER QUADRANT COMPARISON:  June 21, 2022 FINDINGS: Gallbladder: Shadowing echogenic  gallstones are seen within the gallbladder lumen. The largest measures approximately 5 mm. The gallbladder wall measures 3.3 mm in thickness. No sonographic Murphy's sign noted by sonographer (patient was medicated). Common bile duct: Diameter: 4.2 mm Liver: No focal lesion identified. Within normal limits in parenchymal echogenicity. Portal vein is patent on color Doppler imaging with normal direction of blood flow towards the liver. Other: None. IMPRESSION: 1. Cholelithiasis and mild gallbladder wall thickening, without additional findings to suggest the presence of acute cholecystitis. Further evaluation with a nuclear medicine hepatobiliary scan is recommended if this remains of clinical concern. Electronically Signed   By: Aram Candela M.D.   On: 06/26/2022 03:29    PROCEDURES and  INTERVENTIONS:  Procedures  Medications  ondansetron (ZOFRAN) injection 4 mg (4 mg Intravenous Given 06/26/22 0245)  lactated ringers bolus 1,000 mL (0 mLs Intravenous Stopped 06/26/22 0450)  morphine (PF) 4 MG/ML injection 4 mg (4 mg Intravenous Given 06/26/22 0245)     IMPRESSION / MDM / ASSESSMENT AND PLAN / ED COURSE  I reviewed the triage vital signs and the nursing notes.  Differential diagnosis includes, but is not limited to, cholelithiasis, cholecystitis, pancreatitis, gastritis, pyelonephritis, hepatitis  {Patient presents with symptoms of an acute illness or injury that is potentially life-threatening.  26 year old female presents to the ED with recurrence of biliary colic, possibly representing acute cholecystitis.  RUQ tenderness without peritoneal features.  Persistently elevated LFTs.  Normal CBC and lipase.  RUQ ultrasound questions cholecystitis without signs of choledocholithiasis.  Awaiting HIDA scan and surgical evaluation at the time of signout.  Clinical Course as of 06/26/22 2993  [facility] Jun 26, 2022  0354 Reassessed.  Patient looks well.  Laying on her side, on her phone, laughing and watching videos [DS]  [phone] I consult with Dr. Tonna Boehringer. He's in agreement with HIDA. He will pass patient's info along to the daytime surgeon who takes over for him at 7 to follow up after this study [DS]    Clinical Course User Index [DS] Delton Prairie, MD     FINAL CLINICAL IMPRESSION(S) / ED DIAGNOSES   Final diagnoses:  Biliary colic     Rx / DC Orders   ED Discharge Orders     None        Note:  This document was prepared using Dragon voice recognition software and may include unintentional dictation errors.   Delton Prairie, MD 06/26/22 [phone]

## 2022-06-27 ENCOUNTER — Encounter: Payer: Self-pay | Admitting: Internal Medicine

## 2022-06-27 DIAGNOSIS — R7989 Other specified abnormal findings of blood chemistry: Secondary | ICD-10-CM | POA: Diagnosis not present

## 2022-06-27 DIAGNOSIS — B179 Acute viral hepatitis, unspecified: Secondary | ICD-10-CM

## 2022-06-27 DIAGNOSIS — R109 Unspecified abdominal pain: Secondary | ICD-10-CM | POA: Diagnosis not present

## 2022-06-27 DIAGNOSIS — Z87440 Personal history of urinary (tract) infections: Secondary | ICD-10-CM

## 2022-06-27 LAB — CBC
HCT: 37.9 % (ref 36.0–46.0)
Hemoglobin: 11.6 g/dL — ABNORMAL LOW (ref 12.0–15.0)
MCH: 26 pg (ref 26.0–34.0)
MCHC: 30.6 g/dL (ref 30.0–36.0)
MCV: 84.8 fL (ref 80.0–100.0)
Platelets: 364 10*3/uL (ref 150–400)
RBC: 4.47 MIL/uL (ref 3.87–5.11)
RDW: 17.7 % — ABNORMAL HIGH (ref 11.5–15.5)
WBC: 5.1 10*3/uL (ref 4.0–10.5)
nRBC: 0 % (ref 0.0–0.2)

## 2022-06-27 LAB — HEPATIC FUNCTION PANEL
ALT: 382 U/L — ABNORMAL HIGH (ref 0–44)
AST: 106 U/L — ABNORMAL HIGH (ref 15–41)
Albumin: 3.3 g/dL — ABNORMAL LOW (ref 3.5–5.0)
Alkaline Phosphatase: 186 U/L — ABNORMAL HIGH (ref 38–126)
Bilirubin, Direct: 0.1 mg/dL (ref 0.0–0.2)
Indirect Bilirubin: 0.4 mg/dL (ref 0.3–0.9)
Total Bilirubin: 0.5 mg/dL (ref 0.3–1.2)
Total Protein: 6.8 g/dL (ref 6.5–8.1)

## 2022-06-27 LAB — HEPATITIS PANEL, ACUTE
HCV Ab: NONREACTIVE
Hep A IgM: NONREACTIVE
Hep B C IgM: NONREACTIVE
Hepatitis B Surface Ag: NONREACTIVE

## 2022-06-27 LAB — PROTIME-INR
INR: 1 (ref 0.8–1.2)
Prothrombin Time: 13.3 seconds (ref 11.4–15.2)

## 2022-06-27 MED ORDER — PANTOPRAZOLE SODIUM 40 MG PO TBEC: 40.0000 mg | DELAYED_RELEASE_TABLET | Freq: Every day | ORAL | Status: DC

## 2022-06-27 MED ORDER — PANTOPRAZOLE SODIUM 40 MG IV SOLR
40.0000 mg | INTRAVENOUS | Status: DC
  Administered 2022-06-27: 40 mg via INTRAVENOUS
  Filled 2022-06-27: qty 10

## 2022-06-27 MED ORDER — SODIUM CHLORIDE 0.9 % IV SOLN
1.0000 g | INTRAVENOUS | Status: DC
  Administered 2022-06-27: 1 g via INTRAVENOUS
  Filled 2022-06-27: qty 10

## 2022-06-27 NOTE — Consult Note (Signed)
Victoria Darby, MD [address]  [place]  [place], [place] [address]  Main: [phone]  Fax: [phone] Pager: [phone]   Consultation  Referring Provider:     No ref. provider found Primary Care Physician:  Victoria [facility] Primary Gastroenterologist: Victoria Villarreal        Reason for Consultation: Elevated LFTs  Date of Admission:  06/26/2022 Date of Consultation:  06/27/2022         HPI:   Victoria Villarreal is a 26 y.o. African-American female is admitted with recurrence of abdominal pain, nausea and vomiting.  She was recently admitted to [facility] on 06/21/2022 secondary to abdominal pain, associated with nausea, vomiting and diarrhea.  She was found to have significantly elevated LFTs AST 638, ALT 345, alkaline phosphatase 224, total bilirubin 1.  No evidence of leukocytosis.  She underwent CT abdomen pelvis with contrast which revealed right-sided acute pyelonephritis and some possible mild gallbladder wall thickening.  She underwent right upper quadrant ultrasound which revealed cholelithiasis and mild gallbladder wall thickening with negative Murphy sign.  Subsequently, patient underwent MRCP which revealed the features favoring sequelae of hepatitis rather than cholecystitis.  She was discharged home on ciprofloxacin and oxycodone for her pyelonephritis.  However, patient was readmitted yesterday secondary to recurrence of abdominal pain associated with nausea and vomiting.  Patient reports that eating cheese, ice cream triggers her abdominal pain.  She describes the pain in her mid abdomen radiating down as well as in her upper back.  She has chronic low back pain.  Labs during this admission revealed worsening LFTs including alkaline phosphatase 303, AST 594, ALT 599, T. bili 1.9.  She was started on ceftriaxone for UTI.  Surgery was consulted again during this admission, underwent HIDA scan which revealed patent cystic duct, findings compatible with hepatitis.  Patient  is no longer experiencing pain, nausea or vomiting.  When I entered her room, she was tearful and upset that she cannot go home today.  She has 5 children at home, her last child 2 months ago, currently her mom is taking care of her children.  She tells me that when she had ice cream last night, the pain returned.  Labs this morning revealed improving LFTs, normal PT/INR.  Acute viral hepatitis panel negative, serum lipase normal.  Patient was sexually active once since her recent delivery.  She denies any excess Tylenol use or NSAID use.  Denies any drug abuse.  She denies any fever, chills   NSAIDs: None  Antiplts/Anticoagulants/Anti thrombotics: None  GI Procedures: None  Past Medical History:  Diagnosis Date   Anemia    Depression    Trichomoniasis     Past Surgical History:  Procedure Laterality Date   NO PAST SURGERIES       Current Facility-Administered Medications:    acetaminophen (TYLENOL) tablet 650 mg, 650 mg, Oral, Q6H PRN **OR** acetaminophen (TYLENOL) suppository 650 mg, 650 mg, Rectal, Q6H PRN, Victoria Masse, MD   ciprofloxacin (CIPRO) tablet 500 mg, 500 mg, Oral, BID, Victoria Gaudier V, MD, 500 mg at 06/27/22 0809   morphine (PF) 2 MG/ML injection 2 mg, 2 mg, Intravenous, Q2H PRN, Victoria Gaudier V, MD, 2 mg at 06/27/22 0350   ondansetron (ZOFRAN) tablet 4 mg, 4 mg, Oral, Q6H PRN **OR** ondansetron (ZOFRAN) injection 4 mg, 4 mg, Intravenous, Q6H PRN, Victoria Masse, MD   oxyCODONE (Oxy IR/ROXICODONE) immediate release tablet 5 mg, 5 mg, Oral, Q8H PRN, Victoria Masse,  MD, 5 mg at 06/27/22 1312   pantoprazole (PROTONIX) EC tablet 40 mg, 40 mg, Oral, QHS, Victoria Villarreal, RPH   History reviewed. No pertinent family history.   Social History   Tobacco Use   Smoking status: Every Day    Packs/day: 0.25    Types: Cigarettes   Smokeless tobacco: Never  Vaping Use   Vaping Use: Never used  Substance Use Topics   Alcohol use: Not Currently    Alcohol/week: 1.0  standard drink of alcohol    Types: 1 Glasses of wine per week    Comment: intermittently   Drug use: Yes    Types: Marijuana    Allergies as of 06/25/2022 - Review Complete 06/25/2022  Allergen Reaction Noted   Hydrocodone Rash 02/23/2017   Tramadol Rash 02/23/2017    Review of Systems:    All systems reviewed and negative except where noted in HPI.   Physical Exam:  Vital signs in last 24 hours: Temp:  [97.7 F (36.5 C)-98.1 F (36.7 C)] 97.9 F (36.6 C) (09/16 0429) Pulse Rate:  [55-63] 55 (09/16 0429) Resp:  [16-18] 16 (09/16 0429) BP: (113-148)/(65-105) 141/79 (09/16 0429) SpO2:  [98 %-100 %] 100 % (09/16 0429) Last BM Date : 06/23/22 General:   Pleasant, cooperative in NAD Head:  Normocephalic and atraumatic. Eyes:   No icterus.   Conjunctiva pink. PERRLA. Ears:  Normal auditory acuity. Neck:  Supple; no masses or thyroidomegaly Lungs: Respirations even and unlabored. Lungs clear to auscultation bilaterally.   No wheezes, crackles, or rhonchi.  Heart:  Regular rate and rhythm;  Without murmur, clicks, rubs or gallops Abdomen:  Soft, nondistended, nontender. Normal bowel sounds. No appreciable masses or hepatomegaly.  No rebound or guarding.  Rectal:  Not performed. Msk:  Symmetrical without gross deformities.  Strength normal Extremities:  Without edema, cyanosis or clubbing. Neurologic:  Alert and oriented x3;  grossly normal neurologically. Skin:  Intact without significant lesions or rashes. Psych:  Alert and cooperative. Normal affect.  LAB RESULTS:    Latest Ref Rng & Units 06/27/2022   11:26 AM 06/26/2022   12:05 AM 06/22/2022    4:36 AM  CBC  WBC 4.0 - 10.5 K/uL 5.1  5.7  3.4   Hemoglobin 12.0 - 15.0 g/dL 11.6  13.1  10.8   Hematocrit 36.0 - 46.0 % 37.9  41.1  34.8   Platelets 150 - 400 K/uL 364  411  293     BMET    Latest Ref Rng & Units 06/26/2022   12:05 AM 06/22/2022    4:36 AM 06/21/2022    9:13 AM  BMP  Glucose 70 - 99 mg/dL 107  109  154    BUN 6 - 20 mg/dL 10  <5  6   Creatinine 0.44 - 1.00 mg/dL 0.65  0.62  0.70   Sodium 135 - 145 mmol/L 137  143  141   Potassium 3.5 - 5.1 mmol/L 3.6  3.6  2.9   Chloride 98 - 111 mmol/L 102  111  106   CO2 22 - 32 mmol/L '26  26  25   ' Calcium 8.9 - 10.3 mg/dL 9.3  8.6  9.0     LFT    Latest Ref Rng & Units 06/27/2022   11:26 AM 06/26/2022   12:05 AM 06/22/2022    4:36 AM  Hepatic Function  Total Protein 6.5 - 8.1 g/dL 6.8  8.1  6.5   Albumin 3.5 - 5.0 g/dL 3.3  4.0  3.0   AST 15 - 41 U/L 106  594  303   ALT 0 - 44 U/L 382  599  524   Alk Phosphatase 38 - 126 U/L 186  303  199   Total Bilirubin 0.3 - 1.2 mg/dL 0.5  1.9  0.7   Bilirubin, Direct 0.0 - 0.2 mg/dL 0.1   0.4      STUDIES: NM Hepato W/EF  Result Date: 06/26/2022 CLINICAL DATA:  Concern for cholecystitis. Cholelithiasis on ultrasound. EXAM: NUCLEAR MEDICINE HEPATOBILIARY IMAGING TECHNIQUE: Sequential images of the abdomen were obtained out to 60 minutes following intravenous administration of radiopharmaceutical. 3 mg of morphine was administered intravenously with a subsequent 30 minutes of additional scintigraphic imaging acquired post administration. RADIOPHARMACEUTICALS:  5.34 mCi Tc-59m Choletec IV COMPARISON:  Ultrasound June 26, 2022 FINDINGS: Slightly delayed blood pool clearance with delayed clearance of hepatic radiotracer into the biliary tree. Gallbladder activity is not initially visualized within the first 60 minutes of scintigraphic imaging. Curvilinear band of radiotracer within the gallbladder fossa seen 11-15 minutes post morphine administration with this area increasing in size and conspicuity over the subsequent imaging, consistent with patency of cystic duct. Biliary activity passes into small bowel, consistent with patent common bile duct. IMPRESSION: Scintigraphic findings compatible with hepatitis. Patent cystic duct without scintigraphic evidence of acute cholecystitis. However, the delayed  visualization of the gallbladder may reflect sequela of hepatitis or chronic cholecystitis. If concern for chronic cholecystitis would suggest repeat nonemergent nuclear medicine HIDA scan with calculation of ejection fraction as an outpatient upon resolution of patient's current symptomatology given that hepatitis can limit the sensitivity and specificity of HIDA scan for cholecystitis. Electronically Signed   By: JDahlia BailiffM.D.   On: 06/26/2022 14:46   [place] LIMITED RUQ (LIVER/GB)  Result Date: 06/26/2022 CLINICAL DATA:  Right upper quadrant pain. EXAM: ULTRASOUND ABDOMEN LIMITED RIGHT UPPER QUADRANT COMPARISON:  June 21, 2022 FINDINGS: Gallbladder: Shadowing echogenic gallstones are seen within the gallbladder lumen. The largest measures approximately 5 mm. The gallbladder wall measures 3.3 mm in thickness. No sonographic Murphy's sign noted by sonographer (patient was medicated). Common bile duct: Diameter: 4.2 mm Liver: No focal lesion identified. Within normal limits in parenchymal echogenicity. Portal vein is patent on color Doppler imaging with normal direction of blood flow towards the liver. Other: None. IMPRESSION: 1. Cholelithiasis and mild gallbladder wall thickening, without additional findings to suggest the presence of acute cholecystitis. Further evaluation with a nuclear medicine hepatobiliary scan is recommended if this remains of clinical concern. Electronically Signed   By: TVirgina NorfolkM.D.   On: 06/26/2022 03:29      Impression / Plan:   Victoria Villarreal a 26y.o. pleasant African-American female with no segment past medical history, multiple pregnancies, overweight is admitted with abdominal pain, nausea, vomiting, diagnosed with right-sided pyelonephritis, elevated LFTs  Elevated LFTs, predominantly transaminitis No evidence of acute cholecystitis based on several imaging studies LFTs are improving Viral hepatitis work-up is in process Acute viral  hepatitis panel for hepatitis A, B and C were negative HIV negative Advised patient to have LFTs rechecked in 1 week Also, recommend to follow-up with Dr. PHampton Abbotas outpatient to evaluate for chronic cholecystitis Advised patient to follow strict low-calorie, low-carb diet, avoid alcohol use, excess Tylenol and NSAID use Being treated for UTI  Thank you for involving me in the care of this patient.  Patient can be discharged home today    LOS: 0 days  Sherri Sear, MD  06/27/2022, 1:25 PM    Note: This dictation was prepared with Dragon dictation along with smaller phrase technology. Any transcriptional errors that result from this process are unintentional.

## 2022-06-27 NOTE — Plan of Care (Signed)
Patient axox4, RA, independent. Discharging to home, IV removed, discharge instruction given, all questions answered. All belongings gathered prior to leaving.  Problem: Education: Goal: Knowledge of General Education information will improve Description: Including pain rating scale, medication(s)/side effects and non-pharmacologic comfort measures Outcome: Adequate for Discharge   Problem: Health Behavior/Discharge Planning: Goal: Ability to manage health-related needs will improve Outcome: Adequate for Discharge   Problem: Clinical Measurements: Goal: Ability to maintain clinical measurements within normal limits will improve Outcome: Adequate for Discharge Goal: Will remain free from infection Outcome: Adequate for Discharge Goal: Diagnostic test results will improve Outcome: Adequate for Discharge Goal: Respiratory complications will improve Outcome: Adequate for Discharge Goal: Cardiovascular complication will be avoided Outcome: Adequate for Discharge   Problem: Activity: Goal: Risk for activity intolerance will decrease Outcome: Adequate for Discharge   Problem: Nutrition: Goal: Adequate nutrition will be maintained Outcome: Adequate for Discharge   Problem: Coping: Goal: Level of anxiety will decrease Outcome: Adequate for Discharge   Problem: Elimination: Goal: Will not experience complications related to bowel motility Outcome: Adequate for Discharge Goal: Will not experience complications related to urinary retention Outcome: Adequate for Discharge   Problem: Pain Managment: Goal: General experience of comfort will improve Outcome: Adequate for Discharge   Problem: Safety: Goal: Ability to remain free from injury will improve Outcome: Adequate for Discharge   Problem: Skin Integrity: Goal: Risk for impaired skin integrity will decrease Outcome: Adequate for Discharge

## 2022-06-27 NOTE — Progress Notes (Signed)
PHARMACIST - PHYSICIAN COMMUNICATION  CONCERNING: IV to Oral Route Change Policy  RECOMMENDATION: This patient is receiving pantoprazole by the intravenous route.  Based on criteria approved by the Pharmacy and Therapeutics Committee, the intravenous medication(s) is/are being converted to the equivalent oral dose form(s).   DESCRIPTION: These criteria include: The patient is eating (either orally or via tube) and/or has been taking other orally administered medications for a least 24 hours The patient has no evidence of active gastrointestinal bleeding or impaired GI absorption (gastrectomy, short bowel, patient on TNA or NPO).  If you have questions about this conversion, please contact the [place], [facility] 06/27/2022 11:38 AM

## 2022-06-27 NOTE — Assessment & Plan Note (Addendum)
Labs significant for increased transaminases with AST 594, up from 303 days prior and ALT 599 up from 524.  Alk phos 303 up from 199 and total bilirubin 1.9 up from 0.7.   Negative acute hepatitis panel from 06/21/2022 Right upper quadrant ultrasound and HIDA scan not consistent with cholecystitis Patient was seen by surgery in the ED Consider GI consult to assist with determination of etiology Patient had acute hepatitis panel recently done results still pending

## 2022-06-27 NOTE — Discharge Summary (Signed)
Physician Discharge Summary   Patient: Victoria Villarreal MRN: [id number] DOB: [birth year]  Admit date:     06/26/2022  Discharge date: 06/27/22  Discharge Physician: Jennye Boroughs   PCP: [facility]   Recommendations at discharge:  {Tip this will not be part of the note when signed- Example include specific recommendations for outpatient follow-up, pending tests to follow-up on. (Optional):26781} Follow-up with PCP in 1 week Follow-up with Dr. Marius Ditch, gastroenterologist, in 1 week for repeat liver enzymes  Discharge Diagnoses: Principal Problem:   Abdominal pain Active Problems:   Acute hepatitis   History of E. coli pyelonephritis 06/21/2022   Abnormal LFTs of undetermined etiology  Resolved Problems:   * No resolved hospital problems. [facility] Course:  Ms. Victoria Villarreal is a 26 year old woman with medical history significant for recent discharge from the hospital for E. coli pyelonephritis (on ciprofloxacin), gestational hypertension, abnormal glucose tolerance test, anxiety, who presented to the hospital because of nausea, vomiting and abdominal pain.  She had taking oxycodone at home without any relief.  She was found to have hepatitis with elevated liver enzymes.  Abdominal imaging including abdominal ultrasound and HIDA scan did not show any evidence of acute cholecystitis.  She was treated with antiemetics, IV fluids and analgesics.  Liver enzymes improved but did not return to normal.  She was evaluated by the gastroenterologist, Dr. Marius Ditch.  From her standpoint, patient could be discharged since patient wanted to go home and take care of her children.  She recommended repeat liver enzymes in 1 week.  Patient is feeling better.  She said all her symptoms have resolved and she is deemed stable for discharge today.  Assessment and Plan: * Abdominal pain Nausea and vomiting Associated with elevated LFTs, but right upper quadrant sonogram and HIDA negative for  cholecystitis. CT abdomen and pelvis 9/10 showed right pyelonephritis Evaluated by surgery in the ED and likely nonsurgical and acute cholecystitis not suspected Working diagnoses include incompletely treated pyelonephritis, possible gastritis, possible nonviral hepatitis.  Cholecystitis appears to have been ruled out.   IV Protonix, IV antiemetics, IV pain meds and supportive care See management of each etiology on the respective conditions  Abnormal LFTs of undetermined etiology Labs significant for increased transaminases with AST 594, up from 303 days prior and ALT 599 up from 524.  Alk phos 303 up from 199 and total bilirubin 1.9 up from 0.7.   Negative acute hepatitis panel from 06/21/2022 Right upper quadrant ultrasound and HIDA scan not consistent with cholecystitis Patient was seen by surgery in the ED Consider GI consult to assist with determination of etiology Patient had acute hepatitis panel recently done results still pending   History of E. coli pyelonephritis 06/21/2022 Patient was treated with Rocephin and Cipro We will get a repeat urinalysis to assess for clearance given repeated symptoms. We will resume Rocephin pending urinalysis and culture      {Tip this will not be part of the note when signed Body mass index is 30.04 kg/m. , ,  (Optional):26781}  {(NOTE) Pain control PDMP Statment (Optional):26782} Consultants: *** Procedures performed: ***  Disposition: {Plan; Disposition:26390} Diet recommendation:  Discharge Diet Orders (From admission, onward)     Start     Ordered   06/27/22 0000  Diet - low sodium heart healthy        06/27/22 1309           {Diet_Plan:26776} DISCHARGE MEDICATION: Allergies as of 06/27/2022  Reactions   Hydrocodone Rash   Tramadol Rash        Medication List     STOP taking these medications    ibuprofen 600 MG tablet Commonly known as: ADVIL   prenatal multivitamin Tabs tablet       TAKE these  medications    ciprofloxacin 500 MG tablet Commonly known as: CIPRO Take 1 tablet (500 mg total) by mouth 2 (two) times daily for 7 days.   oxyCODONE 5 MG immediate release tablet Commonly known as: Roxicodone Take 1 tablet (5 mg total) by mouth every 8 (eight) hours as needed.        Follow-up Information     Vanga, Tally Due, MD. Schedule an appointment as soon as possible for a visit in 1 week(s).   Specialty: Gastroenterology Why: Follow liver enzymes Contact information: [place] [address] [phone]                Discharge Exam: [name] Weights   06/25/22 2357  Weight: 87 kg   ***  Condition at discharge: {DC Condition:26389}  The results of significant diagnostics from this hospitalization (including imaging, microbiology, ancillary and laboratory) are listed below for reference.   Imaging Studies: NM Hepato W/EF  Result Date: 06/26/2022 CLINICAL DATA:  Concern for cholecystitis. Cholelithiasis on ultrasound. EXAM: NUCLEAR MEDICINE HEPATOBILIARY IMAGING TECHNIQUE: Sequential images of the abdomen were obtained out to 60 minutes following intravenous administration of radiopharmaceutical. 3 mg of morphine was administered intravenously with a subsequent 30 minutes of additional scintigraphic imaging acquired post administration. RADIOPHARMACEUTICALS:  5.34 mCi Tc-7m Choletec IV COMPARISON:  Ultrasound June 26, 2022 FINDINGS: Slightly delayed blood pool clearance with delayed clearance of hepatic radiotracer into the biliary tree. Gallbladder activity is not initially visualized within the first 60 minutes of scintigraphic imaging. Curvilinear band of radiotracer within the gallbladder fossa seen 11-15 minutes post morphine administration with this area increasing in size and conspicuity over the subsequent imaging, consistent with patency of cystic duct. Biliary activity passes into small bowel, consistent with patent common bile  duct. IMPRESSION: Scintigraphic findings compatible with hepatitis. Patent cystic duct without scintigraphic evidence of acute cholecystitis. However, the delayed visualization of the gallbladder may reflect sequela of hepatitis or chronic cholecystitis. If concern for chronic cholecystitis would suggest repeat nonemergent nuclear medicine HIDA scan with calculation of ejection fraction as an outpatient upon resolution of patient's current symptomatology given that hepatitis can limit the sensitivity and specificity of HIDA scan for cholecystitis. Electronically Signed   By: JDahlia BailiffM.D.   On: 06/26/2022 14:46   [place]DOM[place] LIMITED RUQ (LIVER/GB)  Result Date: 06/26/2022 CLINICAL DATA:  Right upper quadrant pain. EXAM: ULTRASOUND ABDOMEN LIMITED RIGHT UPPER QUADRANT COMPARISON:  June 21, 2022 FINDINGS: Gallbladder: Shadowing echogenic gallstones are seen within the gallbladder lumen. The largest measures approximately 5 mm. The gallbladder wall measures 3.3 mm in thickness. No sonographic Murphy's sign noted by sonographer (patient was medicated). Common bile duct: Diameter: 4.2 mm Liver: No focal lesion identified. Within normal limits in parenchymal echogenicity. Portal vein is patent on color Doppler imaging with normal direction of blood flow towards the liver. Other: None. IMPRESSION: 1. Cholelithiasis and mild gallbladder wall thickening, without additional findings to suggest the presence of acute cholecystitis. Further evaluation with a nuclear medicine hepatobiliary scan is recommended if this remains of clinical concern. Electronically Signed   By: TVirgina NorfolkM.D.   On: 06/26/2022 03:29   MR ABDOMEN MRCP W WO  CONTAST  Result Date: 06/21/2022 CLINICAL DATA:  Cholelithiasis. EXAM: MRI ABDOMEN WITHOUT AND WITH CONTRAST (INCLUDING MRCP) TECHNIQUE: Multiplanar multisequence MR imaging of the abdomen was performed both before and after the administration of intravenous contrast. Heavily  T2-weighted images of the biliary and pancreatic ducts were obtained, and three-dimensional MRCP images were rendered by post processing. CONTRAST:  80m GADAVIST GADOBUTROL 1 MMOL/ML IV SOLN COMPARISON:  Right upper quadrant ultrasound and CT abdomen pelvis both dated June 21, 2022. FINDINGS: Lower chest: No acute abnormality. Hepatobiliary: No significant hepatic steatosis. Periportal edema. Slightly mottled appearance of the hepatic parenchyma postcontrast administration. Cholelithiasis in a nondistended gallbladder with thickening of the gallbladder wall thickening of the gallbladder. No choledocholithiasis. No biliary ductal dilation. Pancreas: No mass, inflammatory changes, or other parenchymal abnormality identified. Spleen:  No splenomegaly Adrenals/Urinary Tract: Bilateral adrenal glands appear normal. No hydronephrosis. Kidneys demonstrate symmetric enhancement. Stomach/Bowel: Visualized portions within the abdomen are unremarkable. Vascular/Lymphatic: No pathologically enlarged lymph nodes identified. Portal, splenic and superior mesenteric veins are patent. No abdominal aortic aneurysm demonstrated. Other:  No significant abdominal free fluid. Musculoskeletal: No suspicious bone lesions identified. IMPRESSION: 1. Slightly mottled appearance of the hepatic parenchyma postcontrast administration, periportal edema, cholelithiasis in a nondistended gallbladder and prominent gallbladder wall thickening. Constellation of findings favored to reflect sequela of hepatitis. Although technically acute calculus cholecystitis can not be excluded is NOT favored given the lack of gallbladder distension and that this degree of gallbladder wall thickening is more commonly associated with a hepatic process over gallbladder inflammation. If continued clinical concern for cholecystitis suggest further evaluation with nuclear medicine HIDA scan to assess cystic duct patency. 2. No choledocholithiasis or biliary ductal  dilation. Electronically Signed   By: JDahlia BailiffM.D.   On: 06/21/2022 15:31   MR 3D Recon At Scanner  Result Date: 06/21/2022 CLINICAL DATA:  Cholelithiasis. EXAM: MRI ABDOMEN WITHOUT AND WITH CONTRAST (INCLUDING MRCP) TECHNIQUE: Multiplanar multisequence MR imaging of the abdomen was performed both before and after the administration of intravenous contrast. Heavily T2-weighted images of the biliary and pancreatic ducts were obtained, and three-dimensional MRCP images were rendered by post processing. CONTRAST:  136mGADAVIST GADOBUTROL 1 MMOL/ML IV SOLN COMPARISON:  Right upper quadrant ultrasound and CT abdomen pelvis both dated June 21, 2022. FINDINGS: Lower chest: No acute abnormality. Hepatobiliary: No significant hepatic steatosis. Periportal edema. Slightly mottled appearance of the hepatic parenchyma postcontrast administration. Cholelithiasis in a nondistended gallbladder with thickening of the gallbladder wall thickening of the gallbladder. No choledocholithiasis. No biliary ductal dilation. Pancreas: No mass, inflammatory changes, or other parenchymal abnormality identified. Spleen:  No splenomegaly Adrenals/Urinary Tract: Bilateral adrenal glands appear normal. No hydronephrosis. Kidneys demonstrate symmetric enhancement. Stomach/Bowel: Visualized portions within the abdomen are unremarkable. Vascular/Lymphatic: No pathologically enlarged lymph nodes identified. Portal, splenic and superior mesenteric veins are patent. No abdominal aortic aneurysm demonstrated. Other:  No significant abdominal free fluid. Musculoskeletal: No suspicious bone lesions identified. IMPRESSION: 1. Slightly mottled appearance of the hepatic parenchyma postcontrast administration, periportal edema, cholelithiasis in a nondistended gallbladder and prominent gallbladder wall thickening. Constellation of findings favored to reflect sequela of hepatitis. Although technically acute calculus cholecystitis can not be  excluded is NOT favored given the lack of gallbladder distension and that this degree of gallbladder wall thickening is more commonly associated with a hepatic process over gallbladder inflammation. If continued clinical concern for cholecystitis suggest further evaluation with nuclear medicine HIDA scan to assess cystic duct patency. 2. No choledocholithiasis or biliary ductal dilation. Electronically Signed  By: Dahlia Bailiff M.D.   On: 06/21/2022 15:31   US Abdomen Limited RUQ (LIVER/GB)  Result Date: 06/21/2022 CLINICAL DATA:  Transaminitis. Possible gallbladder wall thickening seen on CT performed earlier today. EXAM: ULTRASOUND ABDOMEN LIMITED RIGHT UPPER QUADRANT COMPARISON:  CT abdomen dated 06/21/2022. FINDINGS: Gallbladder: Multiple small gallstones, largest stone measured at 5 mm. Gallbladder wall is mildly thickened, measuring 4 mm. No pericholecystic fluid seen. No sonographic Murphy's sign elicited during the exam. Common bile duct: Diameter: 5 mm Liver: No focal lesion identified. Within normal limits in parenchymal echogenicity. Portal vein is patent on color Doppler imaging with normal direction of blood flow towards the liver. Other: None. IMPRESSION: 1. Cholelithiasis. 2. Mild gallbladder wall thickening, measuring 4 mm. However, no pericholecystic fluid or sonographic Murphy's sign elicited to confirm an acute cholecystitis. Mild gallbladder wall thickening could indicate a chronic cholecystitis. If clinically needed, consider nuclear medicine HIDA scan to exclude early acute cholecystitis. 3. No bile duct dilatation. Electronically Signed   By: Franki Cabot M.D.   On: 06/21/2022 13:07   CT Abdomen Pelvis W Contrast  Result Date: 06/21/2022 CLINICAL DATA:  Right upper quadrant pain, nausea EXAM: CT ABDOMEN AND PELVIS WITH CONTRAST TECHNIQUE: Multidetector CT imaging of the abdomen and pelvis was performed using the standard protocol following bolus administration of intravenous  contrast. RADIATION DOSE REDUCTION: This exam was performed according to the departmental dose-optimization program which includes automated exposure control, adjustment of the mA and/or kV according to patient size and/or use of iterative reconstruction technique. CONTRAST:  166m OMNIPAQUE IOHEXOL 300 MG/ML  SOLN COMPARISON:  06/09/2014 FINDINGS: Lower chest: Dependent bibasilar atelectasis.  Normal heart size. Hepatobiliary: No focal liver abnormality is identified. Suggestion of mild gallbladder wall thickening. No hyperdense gallstone. No biliary dilatation. Pancreas: Unremarkable. No pancreatic ductal dilatation or surrounding inflammatory changes. Spleen: Normal in size without focal abnormality. Adrenals/Urinary Tract: Unremarkable adrenal glands. There is a wedge-shaped area of decreased enhancement within the lower pole of the right kidney with adjacent perinephric fat stranding (series 2, image 43). The left kidney enhances normally. No focal lesion, stone, or hydronephrosis. Ureters are nondilated. Urinary bladder appears unremarkable. Stomach/Bowel: Stomach is within normal limits. Appendix appears normal (series 2, image 67). No evidence of bowel wall thickening, distention, or inflammatory changes. Vascular/Lymphatic: No significant vascular findings are present. No enlarged abdominal or pelvic lymph nodes. Reproductive: Anteverted uterus.  No adnexal masses. Other: No free fluid. No abdominopelvic fluid collection. No pneumoperitoneum. No abdominal wall hernia. Musculoskeletal: No acute or significant osseous findings. IMPRESSION: 1. Findings suspicious for acute pyelonephritis of the right kidney. Correlation with urinalysis is recommended. 2. Suggestion of mild gallbladder wall thickening. If there is clinical concern for acute cholecystitis, further evaluation with right upper quadrant ultrasound could be performed. Electronically Signed   By: NDavina PokeD.O.   On: 06/21/2022 11:09     Microbiology: Results for orders placed or performed during the hospital encounter of 06/21/22  Urine Culture     Status: Abnormal   Collection Time: 06/21/22  9:13 AM   Specimen: Urine, Clean Catch  Result Value Ref Range Status   Specimen Description   Final    URINE, CLEAN CATCH [facility] [address] [place] [address]   Special Requests   Final    NONE Performed at [facility] [address], [place] [address]   Culture >=100,000 COLONIES/mL ESCHERICHIA COLI (A)  Final   Report Status 06/23/2022  FINAL  Final   Organism ID, Bacteria ESCHERICHIA COLI (A)  Final      Susceptibility   Escherichia coli - MIC*    AMPICILLIN >=32 RESISTANT Resistant     CEFAZOLIN <=4 SENSITIVE Sensitive     CEFEPIME <=0.12 SENSITIVE Sensitive     CEFTRIAXONE <=0.25 SENSITIVE Sensitive     CIPROFLOXACIN <=0.25 SENSITIVE Sensitive     GENTAMICIN <=1 SENSITIVE Sensitive     IMIPENEM <=0.25 SENSITIVE Sensitive     NITROFURANTOIN <=16 SENSITIVE Sensitive     TRIMETH/SULFA <=20 SENSITIVE Sensitive     AMPICILLIN/SULBACTAM 8 SENSITIVE Sensitive     PIP/TAZO <=4 SENSITIVE Sensitive     * >=100,000 COLONIES/mL ESCHERICHIA COLI  SARS Coronavirus 2 by RT PCR (hospital order, performed in [place] hospital lab) *cepheid single result test* Anterior Nasal Swab     Status: None   Collection Time: 06/21/22  2:22 PM   Specimen: Anterior Nasal Swab  Result Value Ref Range Status   SARS Coronavirus 2 by RT PCR NEGATIVE NEGATIVE Final    Comment: (NOTE) SARS-CoV-2 target nucleic acids are NOT DETECTED.  The SARS-CoV-2 RNA is generally detectable in upper and lower respiratory specimens during the acute phase of infection. The lowest concentration of SARS-CoV-2 viral copies this assay can detect is 250 copies / mL. A negative result does not preclude SARS-CoV-2 infection and should not be used as the sole basis for treatment or other patient  management decisions.  A negative result may occur with improper specimen collection / handling, submission of specimen other than nasopharyngeal swab, presence of viral mutation(s) within the areas targeted by this assay, and inadequate number of viral copies (<250 copies / mL). A negative result must be combined with clinical observations, patient history, and epidemiological information.  Fact Sheet for Patients:   https://www.patel.info/  Fact Sheet for Healthcare Providers: https://hall.com/  This test is not yet approved or  cleared by the [place] FDA and has been authorized for detection and/or diagnosis of SARS-CoV-2 by FDA under an Emergency Use Authorization (EUA).  This EUA will remain in effect (meaning this test can be used) for the duration of the COVID-19 declaration under Section 564(b)(1) of the Act, 21 U.S.C. section 360bbb-3(b)(1), unless the authorization is terminated or revoked sooner.  Performed at [facility], [address] [place], [place] [address]     Labs: CBC: Recent Labs  Lab 06/21/22 0913 06/22/22 0436 06/26/22 0005 06/27/22 1126  WBC 7.9 3.4* 5.7 5.1  HGB 12.2 10.8* 13.1 11.6*  HCT 38.2 34.8* 41.1 37.9  MCV 82.3 84.7 82.7 84.8  PLT 359 293 411* 832   Basic Metabolic Panel: Recent Labs  Lab 06/21/22 0913 06/22/22 0436 06/26/22 0005  NA 141 143 137  K 2.9* 3.6 3.6  CL 106 111 102  CO2 '25 26 26  ' GLUCOSE 154* 109* 107*  BUN 6 <5* 10  CREATININE 0.70 0.62 0.65  CALCIUM 9.0 8.6* 9.3   Liver Function Tests: Recent Labs  Lab 06/21/22 0913 06/22/22 0436 06/26/22 0005 06/27/22 1126  AST 638* 303* 594* 106*  ALT 345* 524* 599* 382*  ALKPHOS 224* 199* 303* 186*  BILITOT 1.0 0.7 1.9* 0.5  PROT 7.8 6.5 8.1 6.8  ALBUMIN 3.8 3.0* 4.0 3.3*   CBG: No results for input(s): "GLUCAP" in the last 168 hours.  Discharge time spent: {LESS THAN/GREATER PQDI:26415} 30  minutes.  Signed: Jennye Boroughs, MD [facility] 06/27/2022

## 2022-06-29 ENCOUNTER — Telehealth: Payer: Self-pay

## 2022-06-29 DIAGNOSIS — R7989 Other specified abnormal findings of blood chemistry: Secondary | ICD-10-CM

## 2022-06-29 DIAGNOSIS — K811 Chronic cholecystitis: Secondary | ICD-10-CM

## 2022-06-29 LAB — CMV IGM: CMV IgM: <30 AU/mL (ref 0.0–29.9)

## 2022-06-29 LAB — EPSTEIN-BARR VIRUS VCA, IGM: EBV VCA IgM: <36 U/mL (ref 0.0–35.9)

## 2022-06-29 NOTE — Telephone Encounter (Signed)
Order the labs and informed patient of the information and she states she will go on Thursday

## 2022-06-29 NOTE — Telephone Encounter (Signed)
Placed referral  

## 2022-06-29 NOTE — Telephone Encounter (Signed)
-----   Message from Lin Landsman, MD sent at 06/27/2022  1:26 PM EDT ----- Regarding: Lab work Victoria Villarreal  Please reach out to this patient, order LFTs for Thursday Dx: Abnormal LFTs, hospital follow-up  RV

## 2022-06-29 NOTE — Telephone Encounter (Signed)
-----   Message from Lin Landsman, MD sent at 06/29/2022  8:28 AM EDT ----- Regarding: referral Also, referral to Dr Hampton Abbot Evaluate for chronic cholecystitis  RV

## 2022-07-01 ENCOUNTER — Ambulatory Visit: Payer: Medicaid Other | Admitting: Surgery

## 2022-07-13 ENCOUNTER — Encounter: Payer: Self-pay | Admitting: Surgery

## 2022-07-13 ENCOUNTER — Ambulatory Visit (INDEPENDENT_AMBULATORY_CARE_PROVIDER_SITE_OTHER): Payer: Medicaid Other | Admitting: Surgery

## 2022-07-13 VITALS — BP 137/96 | HR 72 | Temp 98.0°F | Ht 67.0 in | Wt 184.0 lb

## 2022-07-13 DIAGNOSIS — K8020 Calculus of gallbladder without cholecystitis without obstruction: Secondary | ICD-10-CM

## 2022-07-13 NOTE — Patient Instructions (Addendum)
You have requested to have your gallbladder removed. This will be done at [facility] with Dr. Hampton Abbot.  You may use Tylenol, Aleve, or Ibuprofen for pain control between now and the day of your surgery.   You will most likely be out of work 1-2 weeks for this surgery.  If you have FMLA or disability paperwork that needs filled out you may drop this off at our office or this can be faxed to [phone].   You will return after your post-op appointment with a lifting restriction for approximately 4 more weeks.  You will be able to eat anything you would like to following surgery. But, start by eating a bland diet and advance this as tolerated. The Gallbladder diet is below, please go as closely by this diet as possible prior to surgery to avoid any further attacks.  Please see the (blue)pre-care form that you have been given today. Our surgery scheduler will call you to verify surgery date and to go over information.   If you have any questions, please call our office.  Laparoscopic Cholecystectomy Laparoscopic cholecystectomy is surgery to remove the gallbladder. The gallbladder is located in the upper right part of the abdomen, behind the liver. It is a storage sac for bile, which is produced in the liver. Bile aids in the digestion and absorption of fats. Cholecystectomy is often done for inflammation of the gallbladder (cholecystitis). This condition is usually caused by a buildup of gallstones (cholelithiasis) in the gallbladder. Gallstones can block the flow of bile, and that can result in inflammation and pain. In severe cases, emergency surgery may be required. If emergency surgery is not required, you will have time to prepare for the procedure. Laparoscopic surgery is an alternative to open surgery. Laparoscopic surgery has a shorter recovery time. Your common bile duct may also need to be examined during the procedure. If stones are found in the common bile duct, they may be  removed. LET [facility] CARE PROVIDER KNOW ABOUT: Any allergies you have. All medicines you are taking, including vitamins, herbs, eye drops, creams, and over-the-counter medicines. Previous problems you or members of your family have had with the use of anesthetics. Any blood disorders you have. Previous surgeries you have had.  Any medical conditions you have. RISKS AND COMPLICATIONS Generally, this is a safe procedure. However, problems may occur, including: Infection. Bleeding. Allergic reactions to medicines. Damage to other structures or organs. A stone remaining in the common bile duct. A bile leak from the cyst duct that is clipped when your gallbladder is removed. The need to convert to open surgery, which requires a larger incision in the abdomen. This may be necessary if your surgeon thinks that it is not safe to continue with a laparoscopic procedure. BEFORE THE PROCEDURE Ask your health care provider about: Changing or stopping your regular medicines. This is especially important if you are taking diabetes medicines or blood thinners. Taking medicines such as aspirin and ibuprofen. These medicines can thin your blood. Do not take these medicines before your procedure if your health care provider instructs you not to. Follow instructions from your health care provider about eating or drinking restrictions. Let your health care provider know if you develop a cold or an infection before surgery. Plan to have someone take you home after the procedure. Ask your health care provider how your surgical site will be marked or identified. You may be given antibiotic medicine to help prevent infection. PROCEDURE To reduce your risk  of infection: Your health care team will wash or sanitize their hands. Your skin will be washed with soap. An IV tube may be inserted into one of your veins. You will be given a medicine to make you fall asleep (general anesthetic). A breathing tube  will be placed in your mouth. The surgeon will make several small cuts (incisions) in your abdomen. A thin, lighted tube (laparoscope) that has a tiny camera on the end will be inserted through one of the small incisions. The camera on the laparoscope will send a picture to a TV screen (monitor) in the operating room. This will give the surgeon a good view inside your abdomen. A gas will be pumped into your abdomen. This will expand your abdomen to give the surgeon more room to perform the surgery. Other tools that are needed for the procedure will be inserted through the other incisions. The gallbladder will be removed through one of the incisions. After your gallbladder has been removed, the incisions will be closed with stitches (sutures), staples, or skin glue. Your incisions may be covered with a bandage (dressing). The procedure may vary among health care providers and hospitals. AFTER THE PROCEDURE Your blood pressure, heart rate, breathing rate, and blood oxygen level will be monitored often until the medicines you were given have worn off. You will be given medicines as needed to control your pain.   This information is not intended to replace advice given to you by your health care provider. Make sure you discuss any questions you have with your health care provider.   Document Released: 09/28/2005 Document Revised: 06/19/2015 Document Reviewed: 05/10/2013 Elsevier Interactive Patient Education 2016 Elsevier Inc.   Low-Fat Diet for Gallbladder Conditions A low-fat diet can be helpful if you have pancreatitis or a gallbladder condition. With these conditions, your pancreas and gallbladder have trouble digesting fats. A healthy eating plan with less fat will help rest your pancreas and gallbladder and reduce your symptoms. WHAT DO I NEED TO KNOW ABOUT THIS DIET? Eat a low-fat diet. Reduce your fat intake to less than 20-30% of your total daily calories. This is less than 50-60 g of fat  per day. Remember that you need some fat in your diet. Ask your dietician what your daily goal should be. Choose nonfat and low-fat healthy foods. Look for the words "nonfat," "low fat," or "fat free." As a guide, look on the label and choose foods with less than 3 g of fat per serving. Eat only one serving. Avoid alcohol. Do not smoke. If you need help quitting, talk with your health care provider. Eat small frequent meals instead of three large heavy meals. WHAT FOODS CAN I EAT? Grains Include healthy grains and starches such as potatoes, wheat bread, fiber-rich cereal, and brown rice. Choose whole grain options whenever possible. In adults, whole grains should account for 45-65% of your daily calories.  Fruits and Vegetables Eat plenty of fruits and vegetables. Fresh fruits and vegetables add fiber to your diet. Meats and Other Protein Sources Eat lean meat such as chicken and pork. Trim any fat off of meat before cooking it. Eggs, fish, and beans are other sources of protein. In adults, these foods should account for 10-35% of your daily calories. Dairy Choose low-fat milk and dairy options. Dairy includes fat and protein, as well as calcium.  Fats and Oils Limit high-fat foods such as fried foods, sweets, baked goods, sugary drinks.  Other Creamy sauces and condiments, such as mayonnaise, can  add extra fat. Think about whether or not you need to use them, or use smaller amounts or low fat options. WHAT FOODS ARE NOT RECOMMENDED? High fat foods, such as: [employer]. Ice cream. [place] toast. Sweet rolls. Pizza. Cheese bread. Foods covered with batter, butter, creamy sauces, or cheese. Fried foods. Sugary drinks and desserts. Foods that cause gas or bloating   This information is not intended to replace advice given to you by your health care provider. Make sure you discuss any questions you have with your health care provider.   Document Released: 10/03/2013 Document Reviewed:  10/03/2013 Elsevier Interactive Patient Education [employer]

## 2022-07-13 NOTE — Progress Notes (Signed)
07/13/2022  History of Present Illness: Victoria Villarreal is a 26 y.o. female presenting for follow-up for epigastric abdominal pain.  Patient was admitted on 06/21/2022 with upper quadrant abdominal pain.  CT scan was done which showed a right-sided pyelonephritis as well as possible cholecystitis.  Her LFTs were significantly elevated with AST of 638, ALT 345, and alkaline phosphatase 224.  She had an MRCP which did not show any choledocholithiasis but did show slightly mottled appearance of the hepatic parenchyma with periportal edema favoring possible hepatitis.  Work-up for hepatitis was negative and the patient was discharged on 06/23/2022.  She presented again on 06/26/2022 with the same episode of epigastric abdominal pain.  At that time an ultrasound was done of the right upper quadrant which showed cholelithiasis with very mild gallbladder wall thickening but no pericholecystic fluid.  HIDA scan was done the same day which was negative for cholecystitis but showed findings also compatible with hepatitis with delayed clearance of the hepatic radiotracer into the biliary tree.  She was discharged the next day after p.o. trial.  The patient was referred to [place] for evaluation for cholecystectomy given that her hepatitis work-up has been negative.  She reports that since her discharge, she has been maintaining a low-fat diet and that her symptoms have been improving with episodes being much milder after meals compared to how they were before.  She is able to tolerate the discomfort more at home and has not needed to come back to the hospital over the last 2 weeks.  Past Medical History: Past Medical History:  Diagnosis Date   Anemia    Depression    Trichomoniasis      Past Surgical History: Past Surgical History:  Procedure Laterality Date   NO PAST SURGERIES      Home Medications: Prior to Admission medications   Not on File    Allergies: Allergies  Allergen Reactions   Hydrocodone Rash    Tramadol Rash    Review of Systems: Review of Systems  Constitutional:  Negative for chills and fever.  HENT:  Negative for hearing loss.   Respiratory:  Negative for shortness of breath.   Cardiovascular:  Negative for chest pain.  Gastrointestinal:  Positive for abdominal pain and nausea. Negative for constipation, diarrhea and vomiting.  Genitourinary:  Negative for dysuria.  Musculoskeletal:  Negative for myalgias.  Skin:  Negative for rash.  Neurological:  Negative for dizziness.  Psychiatric/Behavioral:  Negative for depression.     Physical Exam BP (!) 137/96   Pulse 72   Temp 98 F (36.7 C)   Ht 5\' 7"  (1.702 m)   Wt 184 lb (83.5 kg)   LMP 07/13/2022 (Exact Date)   SpO2 99%   BMI 28.82 kg/m  CONSTITUTIONAL: No acute distress, well-nourished HEENT:  Normocephalic, atraumatic, extraocular motion intact. NECK: Trachea is midline, no jugular venous distention. RESPIRATORY:  Lungs are clear, and breath sounds are equal bilaterally. Normal respiratory effort without pathologic use of accessory muscles. CARDIOVASCULAR: Heart is regular without murmurs, gallops, or rubs. GI: The abdomen is soft, nondistended, with some discomfort to palpation in the epigastric and right upper quadrant areas.  Negative Murphy's sign.  MUSCULOSKELETAL: Normal gait, no peripheral edema. NEUROLOGIC:  Motor and sensation is grossly normal.  Cranial nerves are grossly intact. PSYCH:  Alert and oriented to person, place and time. Affect is normal.  Labs/Imaging: CT abdomen/pelvis on 06/21/2022: IMPRESSION: 1. Findings suspicious for acute pyelonephritis of the right kidney. Correlation with urinalysis is  recommended. 2. Suggestion of mild gallbladder wall thickening. If there is clinical concern for acute cholecystitis, further evaluation with right upper quadrant ultrasound could be performed.  Ultrasound RUQ on 06/21/2022: IMPRESSION: 1. Cholelithiasis. 2. Mild gallbladder wall  thickening, measuring 4 mm. However, no pericholecystic fluid or sonographic Murphy's sign elicited to confirm an acute cholecystitis. Mild gallbladder wall thickening could indicate a chronic cholecystitis. If clinically needed, consider nuclear medicine HIDA scan to exclude early acute cholecystitis. 3. No bile duct dilatation.  MRCP on 06/21/2022: IMPRESSION: 1. Slightly mottled appearance of the hepatic parenchyma postcontrast administration, periportal edema, cholelithiasis in a nondistended gallbladder and prominent gallbladder wall thickening. Constellation of findings favored to reflect sequela of hepatitis. Although technically acute calculus cholecystitis can not be excluded is NOT favored given the lack of gallbladder distension and that this degree of gallbladder wall thickening is more commonly associated with a hepatic process over gallbladder inflammation. If continued clinical concern for cholecystitis suggest further evaluation with nuclear medicine HIDA scan to assess cystic duct patency. 2. No choledocholithiasis or biliary ductal dilation.  HIDA scan on 06/26/2022: IMPRESSION: Scintigraphic findings compatible with hepatitis.   Patent cystic duct without scintigraphic evidence of acute cholecystitis. However, the delayed visualization of the gallbladder may reflect sequela of hepatitis or chronic cholecystitis. If concern for chronic cholecystitis would suggest repeat nonemergent nuclear medicine HIDA scan with calculation of ejection fraction as an outpatient upon resolution of patient's current symptomatology given that hepatitis can limit the sensitivity and specificity of HIDA scan for cholecystitis.  Assessment and Plan: This is a 26 y.o. female with possible symptomatic cholelithiasis.  - Discussed with the patient the findings on all her imaging studies and laboratory work-up that was done in the hospital during her 2 admissions.  Overall there was  suspicion about hepatitis being more the root cause of her abdominal pain.  However her hepatitis work-up has also been negative with all panels negative.  Discussed with patient that my suspicion now would be that she may be indeed having symptomatic cholelithiasis and potentially passing gallstones through the common bile duct which is causing elevation of the AST and ALT.  The stones are very small as noted on her MRCP and so this may be small enough to pass through but not really cause a blockage.  The patient's symptoms have been milder as she has been adhering to a low-fat diet but she is very to address this in a more definitive manner. -Discussed with the patient then the plan for a robotic assisted cholecystectomy with ICG to evaluate the biliary anatomy.  We will order a CMP preoperatively to evaluate her more recent LFTs given the prior elevations.  Reviewed with her the surgery at length including the risks of bleeding, infection, injury to surrounding structures, the use of ICG, the potential for intraoperative cholangiogram depending on her more recent LFTs, that this would be an outpatient procedure, postoperative activity restrictions, incisions plan, and she is willing to proceed. - Patient will be scheduled for surgery on 08/04/2022.  All of her questions have been answered.  I spent 40 minutes dedicated to the care of this patient on the date of this encounter to include pre-visit review of records, face-to-face time with the patient discussing diagnosis and management, and any post-visit coordination of care.   Melvyn Neth, [facility]

## 2022-07-14 ENCOUNTER — Telehealth: Payer: Self-pay | Admitting: Surgery

## 2022-07-14 NOTE — Telephone Encounter (Signed)
Mailbox full, not able to leave a message.  Please inform patient of the following regarding scheduled surgery.    Pre-Admission date/time, and Surgery date at [facility].  Surgery Date: 08/04/22 Preadmission Testing Date: 07/29/22 (phone 8a-1p)  Also patient will need to call at [phone], between 1-3:00pm the day before surgery, to find out what time to arrive for surgery.  '

## 2022-07-15 NOTE — Telephone Encounter (Signed)
Patient calls back, she is informed of all dates regarding her surgery and reminded the importance of making sure she is available when preadmissions calls for her preop.  Patient verbalized understanding.

## 2022-07-20 ENCOUNTER — Emergency Department
Admission: EM | Admit: 2022-07-20 | Discharge: 2022-07-20 | Disposition: A | Discharge status: Home / Self Care | Payer: Medicaid Other

## 2022-07-29 ENCOUNTER — Other Ambulatory Visit: Payer: Self-pay

## 2022-07-29 ENCOUNTER — Encounter: Payer: Self-pay | Admitting: Urgent Care

## 2022-07-29 ENCOUNTER — Encounter
Admission: RE | Admit: 2022-07-29 | Discharge: 2022-07-29 | Disposition: A | Discharge status: Home / Self Care | Payer: Medicaid Other | Source: Ambulatory Visit | Attending: Surgery | Admitting: Surgery

## 2022-07-29 VITALS — Ht 67.0 in | Wt 184.0 lb

## 2022-07-29 DIAGNOSIS — Z01812 Encounter for preprocedural laboratory examination: Secondary | ICD-10-CM

## 2022-07-29 DIAGNOSIS — K8020 Calculus of gallbladder without cholecystitis without obstruction: Secondary | ICD-10-CM

## 2022-07-29 HISTORY — DX: Tubulo-interstitial nephritis, not specified as acute or chronic: N12

## 2022-07-29 MED ORDER — GABAPENTIN 300 MG PO CAPS: 300.0000 mg | ORAL_CAPSULE | ORAL | Status: DC

## 2022-07-29 NOTE — Patient Instructions (Addendum)
Your procedure is scheduled on: August 04, 2022 TUESDAY Report to the Registration Desk on the 1st floor of the [facility]. To find out your arrival time, please call [phone] between [place] on: August 03, 2022 MONDAY If your arrival time is 6:00 am, do not arrive prior to that time as the [facility] entrance doors do not open until 6:00 am.  REMEMBER: Instructions that are not followed completely may result in serious medical risk, up to and including death; or upon the discretion of your surgeon and anesthesiologist your surgery may need to be rescheduled.  Do not eat food after midnight the night before surgery.  No gum chewing, lozengers or hard candies.  You may however, drink CLEAR liquids up to 2 hours before you are scheduled to arrive for your surgery. Do not drink anything within 2 hours of your scheduled arrival time.  Clear liquids include: - water  - apple juice without pulp - gatorade (not RED colors) - black coffee or tea (Do NOT add milk or creamers to the coffee or tea) Do NOT drink anything that is not on this list.  TAKE THESE MEDICATIONS THE MORNING OF SURGERY WITH A SIP OF WATER: None  One week prior to surgery: Stop Anti-inflammatories (NSAIDS) such as Advil, Aleve, Ibuprofen, Motrin, Naproxen, Naprosyn and Aspirin based products such as Excedrin, Goodys Powder, BC Powder. Stop ANY OVER THE COUNTER supplements until after surgery. You may however, continue to take Tylenol if needed for pain up until the day of surgery.  No Alcohol for 24 hours before or after surgery.  No Smoking including e-cigarettes for 24 hours prior to surgery.  No chewable tobacco products for at least 6 hours prior to surgery.  No nicotine patches on the day of surgery.  Do not use any "recreational" drugs for at least a week prior to your surgery.  Please be advised that the combination of cocaine and anesthesia may have negative outcomes, up to and including death. If  you test positive for cocaine, your surgery will be cancelled.  On the morning of surgery brush your teeth with toothpaste and water, you may rinse your mouth with mouthwash if you wish. Do not swallow any toothpaste or mouthwash.  Use CHG Soap as directed on instruction sheet.  Do not wear jewelry, make-up, hairpins, clips or nail polish.  Do not wear lotions, powders, or perfumes OR DEODORANT  Do not shave body from the neck down 48 hours prior to surgery just in case you cut yourself which could leave a site for infection.  Also, freshly shaved skin may become irritated if using the CHG soap.  Contact lenses, hearing aids and dentures may not be worn into surgery.  Do not bring valuables to the hospital. [facility] is not responsible for any missing/lost belongings or valuables.   Notify your doctor if there is any change in your medical condition (cold, fever, infection).  Wear comfortable clothing (specific to your surgery type) to the hospital.  After surgery, you can help prevent lung complications by doing breathing exercises.  Take deep breaths and cough every 1-2 hours. Your doctor may order a device called an Incentive Spirometer to help you take deep breaths. When coughing or sneezing, hold a pillow firmly against your incision with both hands. This is called "splinting." Doing this helps protect your incision. It also decreases belly discomfort.  If you are being discharged the day of surgery, you will not be allowed to drive  home. You will need a responsible adult (18 years or older) to drive you home and stay with you that night.   If you are taking public transportation, you will need to have a responsible adult (18 years or older) with you. Please confirm with your physician that it is acceptable to use public transportation.   Please call the [place] Dept. at [phone] if you have any questions about these instructions.  Surgery Visitation  Policy:  Patients undergoing a surgery or procedure may have two family members or support persons with them as long as the person is not COVID-19 positive or experiencing its symptoms.

## 2022-07-30 ENCOUNTER — Inpatient Hospital Stay: Admission: RE | Admit: 2022-07-30 | Payer: Medicaid Other | Source: Ambulatory Visit

## 2022-07-31 ENCOUNTER — Encounter
Admission: RE | Admit: 2022-07-31 | Discharge: 2022-07-31 | Disposition: A | Discharge status: Home / Self Care | Payer: Medicaid Other | Source: Ambulatory Visit | Attending: Surgery | Admitting: Surgery

## 2022-07-31 DIAGNOSIS — K8020 Calculus of gallbladder without cholecystitis without obstruction: Secondary | ICD-10-CM | POA: Insufficient documentation

## 2022-07-31 DIAGNOSIS — Z01812 Encounter for preprocedural laboratory examination: Secondary | ICD-10-CM | POA: Diagnosis present

## 2022-07-31 DIAGNOSIS — F1490 Cocaine use, unspecified, uncomplicated: Secondary | ICD-10-CM | POA: Diagnosis not present

## 2022-07-31 HISTORY — DX: Other psychoactive substance abuse, uncomplicated: F19.10

## 2022-07-31 LAB — URINE DRUG SCREEN, QUALITATIVE (ARMC ONLY)
Amphetamines, Ur Screen: NOT DETECTED
Barbiturates, Ur Screen: NOT DETECTED
Benzodiazepine, Ur Scrn: NOT DETECTED
Cannabinoid 50 Ng, Ur ~~LOC~~: POSITIVE — AB
Cocaine Metabolite,Ur ~~LOC~~: POSITIVE — AB
MDMA (Ecstasy)Ur Screen: NOT DETECTED
Methadone Scn, Ur: NOT DETECTED
Opiate, Ur Screen: NOT DETECTED
Phencyclidine (PCP) Ur S: NOT DETECTED
Tricyclic, Ur Screen: NOT DETECTED

## 2022-07-31 LAB — COMPREHENSIVE METABOLIC PANEL
ALT: 17 U/L (ref 0–44)
AST: 19 U/L (ref 15–41)
Albumin: 3.8 g/dL (ref 3.5–5.0)
Alkaline Phosphatase: 70 U/L (ref 38–126)
Anion gap: 7 (ref 5–15)
BUN: 7 mg/dL (ref 6–20)
CO2: 24 mmol/L (ref 22–32)
Calcium: 9.1 mg/dL (ref 8.9–10.3)
Chloride: 107 mmol/L (ref 98–111)
Creatinine, Ser: 0.67 mg/dL (ref 0.44–1.00)
GFR, Estimated: >60 mL/min (ref >60)
Glucose, Bld: 91 mg/dL (ref 70–99)
Potassium: 3.7 mmol/L (ref 3.5–5.1)
Sodium: 138 mmol/L (ref 135–145)
Total Bilirubin: 0.5 mg/dL (ref 0.3–1.2)
Total Protein: 7.3 g/dL (ref 6.5–8.1)

## 2022-07-31 NOTE — Progress Notes (Signed)
  [facility] Perioperative Services: Pre-Admission/Anesthesia Testing  Abnormal Lab Notification   Date: 07/31/22  Name: LIZETT CHOWNING MRN:   [id number]  Re: Abnormal labs noted during PAT appointment   Notified:  Provider Name Provider Role Notification Mode  Olean Ree, MD General Surgery (Surgeon) Routed and/or faxed via [place] and Notes:  ABNORMAL LAB VALUE(S):  Component Date Value   Tricyclic, Ur Screen 34/19/3790 NONE DETECTED    Amphetamines, Ur Screen 07/31/2022 NONE DETECTED    MDMA (Ecstasy)Ur Screen 07/31/2022 NONE DETECTED    Cocaine Metabolite,Ur [place] 07/31/2022 POSITIVE (A)    Opiate, Ur Screen 07/31/2022 NONE DETECTED    Phencyclidine (PCP) Ur S 07/31/2022 NONE DETECTED    Cannabinoid 36 Ng, Ur [place] 07/31/2022 POSITIVE (A)    Barbiturates, Ur Screen 07/31/2022 NONE DETECTED    Benzodiazepine, Ur Scrn 07/31/2022 NONE DETECTED    Methadone Scn, Ur 07/31/2022 NONE DETECTED    Roxan S Sockwell is scheduled for an elective XI ROBOTIC ASSISTED LAPAROSCOPIC CHOLECYSTECTOMY on 08/04/2022. Patient with a PMH (+) for polysubstance abuse.  Preoperative UDS performed in order to determine what substances patient may have potentially ingested prior to upcoming elective orthopedic surgery.   Impression and Plan:  Patient was (+) for both marijuana and cocaine today, with the cocaine being the substance of concern, The main cocaine metabolite tested for in urine, benzoylecgonine, can be detected for up to +/- 10 days (average 7 days) following cocaine use. Cocaine use, in the setting of general anesthesia administration, places the patient at significantly increased risks of potential perioperative complications. Complications can include, but are not limited to, malignant hypertension, tachyarrhythmias/dysrhythmias, prolonged QTc intervals, myocardial ischemia/vasospasm, convulsions, aortic dissection, CVA, and premature death.   She was  advised both verbally and in writing of the above during her PAT appointment. She was asked to refrain from illegal substance use prior to surgery and in the immediate postoperative period. Encouraged cessation all together. Note being sent to primary attending surgeon to make him aware of the results. At this juncture, case will need to be postponed allowing for an adequate 7 day commitment from the patient to refrain from the use of illegal substances as discussed during her appointment with the PAT staff. UDS will be repeated on the day of surgery; order has been entered. Patient is aware that if she is cocaine (+) on the day of her procedure, given the elective nature, her surgery will be cancelled. Patient verbalized understanding of all of the aforementioned.   Results of this testing HAS NOT been discussed with patient. Will ask surgeon to have his office communicate plans with the OR to have case rescheduled, unless of course, case is being deemed urgent /emergent. Documentation from attending surgeon will need to reflect urgent/emergent case status.   Honor Loh, MSN, APRN, FNP-C, CEN [facility]  Peri-operative Services Nurse Practitioner Phone: [phone] Fax: [phone] 07/31/22 2:45 PM

## 2022-08-03 ENCOUNTER — Telehealth: Payer: Self-pay | Admitting: Surgery

## 2022-08-03 NOTE — Telephone Encounter (Signed)
Outgoing call, not able to leave a message, voice mailbox full.  Surgery rescheduled due to patient testing positive for drug test, there is a certain wait period before rescheduling surgery.   If patient calls back, please inform her of the following regarding rescheduled surgery with Dr. Hampton Abbot for robotic cholecystectomy.   Pre-Admission date/time, and Surgery date at [facility].  Surgery Date: 08/18/22 Preadmission Testing Date: 08/12/22 (phone 8a-1p)  Patient will also need to call at [phone], between 1-3:00pm the day before surgery, to find out what time to arrive for surgery.    When speaking with the patient about rescheduling surgery to 08/18/22 she was also informed and reminded that her medicaid will be expiring on 08/11/22 and will need to contact her provider for this to get extended. She verbalized understanding.

## 2022-08-04 DIAGNOSIS — F1490 Cocaine use, unspecified, uncomplicated: Secondary | ICD-10-CM

## 2022-08-04 DIAGNOSIS — R825 Elevated urine levels of drugs, medicaments and biological substances: Secondary | ICD-10-CM

## 2022-08-04 DIAGNOSIS — F1290 Cannabis use, unspecified, uncomplicated: Secondary | ICD-10-CM

## 2022-08-04 DIAGNOSIS — Z01812 Encounter for preprocedural laboratory examination: Secondary | ICD-10-CM

## 2022-08-04 NOTE — Telephone Encounter (Signed)
Called patient back, she is now aware of all dates regarding her surgery.  She is encouraged to refrain from drug use, patient states she really wants to get this done and verbalized understanding.

## 2022-08-12 ENCOUNTER — Inpatient Hospital Stay
Admission: RE | Admit: 2022-08-12 | Discharge: 2022-08-12 | Disposition: A | Discharge status: Home / Self Care | Payer: Medicaid Other | Source: Ambulatory Visit

## 2022-08-12 DIAGNOSIS — F1910 Other psychoactive substance abuse, uncomplicated: Secondary | ICD-10-CM

## 2022-08-12 NOTE — Patient Instructions (Addendum)
Your procedure is scheduled on: August 18, 2022 Thursday Report to the Registration Desk on the 1st floor of the [facility] To find out your arrival time, please call [phone] between 1PM - 3PM on: August 17, 2022 Wednesday If your arrival time is 6:00 am, do not arrive prior to that time as the [facility] entrance doors do not open until 6:00 am.   REMEMBER: Instructions that are not followed completely may result in serious medical risk, up to and including death; or upon the discretion of your surgeon and anesthesiologist your surgery may need to be rescheduled.   Do not eat food after midnight the night before surgery.  No gum chewing, lozengers or hard candies.   You may however, drink CLEAR liquids up to 2 hours before you are scheduled to arrive for your surgery. Do not drink anything within 2 hours of your scheduled arrival time.   Clear liquids include: - water  - apple juice without pulp - gatorade (not RED colors) - black coffee or tea (Do NOT add milk or creamers to the coffee or tea) Do NOT drink anything that is not on this list.   TAKE THESE MEDICATIONS THE MORNING OF SURGERY WITH A SIP OF WATER: None   One week prior to surgery: Stop Anti-inflammatories (NSAIDS) such as Advil, Aleve, Ibuprofen, Motrin, Naproxen, Naprosyn and Aspirin based products such as Excedrin, Goodys Powder, BC Powder. Stop ANY OVER THE COUNTER supplements until after surgery. You may however, continue to take Tylenol if needed for pain up until the day of surgery.   No Alcohol for 24 hours before or after surgery.   No Smoking including e-cigarettes for 24 hours prior to surgery.  No chewable tobacco products for at least 6 hours prior to surgery.  No nicotine patches on the day of surgery.   Do not use any "recreational" drugs for at least a week prior to your surgery.  Please be advised that the combination of cocaine and anesthesia may have negative outcomes, up to and  including death. If you test positive for cocaine, your surgery will be cancelled.   On the morning of surgery brush your teeth with toothpaste and water, you may rinse your mouth with mouthwash if you wish. Do not swallow any toothpaste or mouthwash.   Use CHG Soap as directed on instruction sheet.   Do not wear jewelry, make-up, hairpins, clips or nail polish.   Do not wear lotions, powders, or perfumes OR DEODORANT   Do not shave body from the neck down 48 hours prior to surgery just in case you cut yourself which could leave a site for infection.  Also, freshly shaved skin may become irritated if using the CHG soap.   Contact lenses, hearing aids and dentures may not be worn into surgery.   Do not bring valuables to the hospital. [facility] is not responsible for any missing/lost belongings or valuables.    Notify your doctor if there is any change in your medical condition (cold, fever, infection).   Wear comfortable clothing (specific to your surgery type) to the hospital.   After surgery, you can help prevent lung complications by doing breathing exercises.  Take deep breaths and cough every 1-2 hours. Your doctor may order a device called an Incentive Spirometer to help you take deep breaths. When coughing or sneezing, hold a pillow firmly against your incision with both hands. This is called "splinting." Doing this helps protect your incision. It also decreases  belly discomfort.   If you are being discharged the day of surgery, you will not be allowed to drive home. You will need a responsible adult (18 years or older) to drive you home and stay with you that night.    If you are taking public transportation, you will need to have a responsible adult (18 years or older) with you. Please confirm with your physician that it is acceptable to use public transportation.    Please call the [facility] at [phone] if you have any questions about these  instructions.   Surgery Visitation Policy:   Patients undergoing a surgery or procedure may have two family members or support persons with them as long as the person is not COVID-19 positive or experiencing its symptoms.

## 2022-08-17 ENCOUNTER — Encounter: Payer: Self-pay | Admitting: Urgent Care

## 2022-08-17 ENCOUNTER — Inpatient Hospital Stay: Admission: RE | Admit: 2022-08-17 | Payer: Medicaid Other | Source: Ambulatory Visit

## 2022-08-17 DIAGNOSIS — F1910 Other psychoactive substance abuse, uncomplicated: Secondary | ICD-10-CM

## 2022-08-17 MED ORDER — FAMOTIDINE 20 MG PO TABS: 20.0000 mg | ORAL_TABLET | Freq: Once | ORAL | Status: DC

## 2022-08-17 MED ORDER — CEFAZOLIN SODIUM-DEXTROSE 2-4 GM/100ML-% IV SOLN: 2.0000 g | Freq: Once | INTRAVENOUS | Status: DC

## 2022-08-18 ENCOUNTER — Ambulatory Visit: Admission: RE | Admit: 2022-08-18 | Payer: Medicaid Other | Source: Home / Self Care | Admitting: Surgery

## 2022-08-18 ENCOUNTER — Encounter: Admission: RE | Payer: Self-pay | Source: Home / Self Care

## 2022-08-18 DIAGNOSIS — F1290 Cannabis use, unspecified, uncomplicated: Secondary | ICD-10-CM

## 2022-08-18 DIAGNOSIS — F1490 Cocaine use, unspecified, uncomplicated: Secondary | ICD-10-CM

## 2022-08-18 DIAGNOSIS — Z01812 Encounter for preprocedural laboratory examination: Secondary | ICD-10-CM

## 2022-08-18 DIAGNOSIS — R825 Elevated urine levels of drugs, medicaments and biological substances: Secondary | ICD-10-CM

## 2022-08-18 DIAGNOSIS — F1910 Other psychoactive substance abuse, uncomplicated: Secondary | ICD-10-CM

## 2022-08-18 SURGERY — CHOLECYSTECTOMY, ROBOT-ASSISTED, LAPAROSCOPIC
Anesthesia: General

## 2022-08-18 NOTE — Progress Notes (Signed)
RN reached out to patient to check due to no call/ no/show for scheduled surgery but patient stated she is out of town and can't make it. Team made aware.

## 2022-08-19 ENCOUNTER — Telehealth: Payer: Self-pay | Admitting: Surgery

## 2022-08-19 NOTE — Telephone Encounter (Signed)
This patient no showed her surgery scheduled for 08/18/22. She has already been rescheduled due to drug use and positive testing.  If patient calls, will need to check with Dr. Aleen Campi first before scheduling appointment or rescheduling of surgery.

## 2023-01-15 DIAGNOSIS — R1030 Lower abdominal pain, unspecified: Secondary | ICD-10-CM | POA: Diagnosis not present

## 2023-01-15 DIAGNOSIS — R112 Nausea with vomiting, unspecified: Secondary | ICD-10-CM | POA: Insufficient documentation

## 2023-01-15 DIAGNOSIS — Z5321 Procedure and treatment not carried out due to patient leaving prior to being seen by health care provider: Secondary | ICD-10-CM | POA: Insufficient documentation

## 2023-01-15 LAB — CBC
HCT: 39.6 % (ref 36.0–46.0)
Hemoglobin: 12.6 g/dL (ref 12.0–15.0)
MCH: 28.8 pg (ref 26.0–34.0)
MCHC: 31.8 g/dL (ref 30.0–36.0)
MCV: 90.4 fL (ref 80.0–100.0)
Platelets: 328 10*3/uL (ref 150–400)
RBC: 4.38 MIL/uL (ref 3.87–5.11)
RDW: 15.1 % (ref 11.5–15.5)
WBC: 8.1 10*3/uL (ref 4.0–10.5)
nRBC: 0 % (ref 0.0–0.2)

## 2023-01-15 LAB — COMPREHENSIVE METABOLIC PANEL
ALT: 14 U/L (ref 0–44)
AST: 15 U/L (ref 15–41)
Albumin: 3.7 g/dL (ref 3.5–5.0)
Alkaline Phosphatase: 56 U/L (ref 38–126)
Anion gap: 8 (ref 5–15)
BUN: 10 mg/dL (ref 6–20)
CO2: 25 mmol/L (ref 22–32)
Calcium: 9.3 mg/dL (ref 8.9–10.3)
Chloride: 102 mmol/L (ref 98–111)
Creatinine, Ser: 0.61 mg/dL (ref 0.44–1.00)
GFR, Estimated: >60 mL/min (ref >60)
Glucose, Bld: 106 mg/dL — ABNORMAL HIGH (ref 70–99)
Potassium: 4 mmol/L (ref 3.5–5.1)
Sodium: 135 mmol/L (ref 135–145)
Total Bilirubin: 0.4 mg/dL (ref 0.3–1.2)
Total Protein: 7.8 g/dL (ref 6.5–8.1)

## 2023-01-15 LAB — URINALYSIS, ROUTINE W REFLEX MICROSCOPIC
Bilirubin Urine: NEGATIVE
Glucose, UA: NEGATIVE mg/dL
Hgb urine dipstick: NEGATIVE
Ketones, ur: NEGATIVE mg/dL
Leukocytes,Ua: NEGATIVE
Nitrite: NEGATIVE
Protein, ur: NEGATIVE mg/dL
Specific Gravity, Urine: 1.015 (ref 1.005–1.030)
pH: 6 (ref 5.0–8.0)

## 2023-01-15 LAB — POC URINE PREG, ED: Preg Test, Ur: NEGATIVE

## 2023-01-15 LAB — LIPASE, BLOOD: Lipase: 31 U/L (ref 11–51)

## 2023-01-15 NOTE — ED Triage Notes (Signed)
Pt presents ambulatory to triage via [facility] complaints of lower abdominal pain with associated N/V that started an hour ago. She notes having pain following the consumption of her dinner. She states her emesis was the food she consumed and she was able to tolerate tylenol. A&Ox4 at this time. Denies urinary sx, fevers, chills, diarrhea, CP or SOB.

## 2023-01-16 ENCOUNTER — Emergency Department
Admission: EM | Admit: 2023-01-16 | Discharge: 2023-01-16 | Discharge status: Against Medical Advice | Payer: Medicaid Other | Attending: Emergency Medicine | Admitting: Emergency Medicine

## 2023-01-16 NOTE — ED Notes (Signed)
No answer when called for a room, not visualized in the lobby.  

## 2023-02-05 ENCOUNTER — Emergency Department
Admission: EM | Admit: 2023-02-05 | Discharge: 2023-02-05 | Disposition: A | Discharge status: Home / Self Care | Payer: Medicaid Other | Attending: Emergency Medicine | Admitting: Emergency Medicine

## 2023-02-05 ENCOUNTER — Other Ambulatory Visit: Payer: Self-pay

## 2023-02-05 ENCOUNTER — Emergency Department: Payer: Medicaid Other

## 2023-02-05 DIAGNOSIS — R1013 Epigastric pain: Secondary | ICD-10-CM | POA: Diagnosis present

## 2023-02-05 DIAGNOSIS — K8020 Calculus of gallbladder without cholecystitis without obstruction: Secondary | ICD-10-CM | POA: Diagnosis not present

## 2023-02-05 LAB — COMPREHENSIVE METABOLIC PANEL
ALT: 16 U/L (ref 0–44)
AST: 17 U/L (ref 15–41)
Albumin: 3.6 g/dL (ref 3.5–5.0)
Alkaline Phosphatase: 50 U/L (ref 38–126)
Anion gap: 9 (ref 5–15)
BUN: 12 mg/dL (ref 6–20)
CO2: 24 mmol/L (ref 22–32)
Calcium: 8.8 mg/dL — ABNORMAL LOW (ref 8.9–10.3)
Chloride: 106 mmol/L (ref 98–111)
Creatinine, Ser: 0.69 mg/dL (ref 0.44–1.00)
GFR, Estimated: >60 mL/min (ref >60)
Glucose, Bld: 117 mg/dL — ABNORMAL HIGH (ref 70–99)
Potassium: 3.7 mmol/L (ref 3.5–5.1)
Sodium: 139 mmol/L (ref 135–145)
Total Bilirubin: 0.5 mg/dL (ref 0.3–1.2)
Total Protein: 7.6 g/dL (ref 6.5–8.1)

## 2023-02-05 LAB — URINE DRUG SCREEN, QUALITATIVE (ARMC ONLY)
Amphetamines, Ur Screen: NOT DETECTED
Barbiturates, Ur Screen: NOT DETECTED
Benzodiazepine, Ur Scrn: NOT DETECTED
Cannabinoid 50 Ng, Ur ~~LOC~~: POSITIVE — AB
Cocaine Metabolite,Ur ~~LOC~~: POSITIVE — AB
MDMA (Ecstasy)Ur Screen: NOT DETECTED
Methadone Scn, Ur: NOT DETECTED
Opiate, Ur Screen: NOT DETECTED
Phencyclidine (PCP) Ur S: NOT DETECTED
Tricyclic, Ur Screen: POSITIVE — AB

## 2023-02-05 LAB — CBC WITH DIFFERENTIAL/PLATELET
Abs Immature Granulocytes: 0.01 10*3/uL (ref 0.00–0.07)
Basophils Absolute: 0 10*3/uL (ref 0.0–0.1)
Basophils Relative: 0 %
Eosinophils Absolute: 0.3 10*3/uL (ref 0.0–0.5)
Eosinophils Relative: 4 %
HCT: 41.2 % (ref 36.0–46.0)
Hemoglobin: 13.1 g/dL (ref 12.0–15.0)
Immature Granulocytes: 0 %
Lymphocytes Relative: 32 %
Lymphs Abs: 2.5 10*3/uL (ref 0.7–4.0)
MCH: 28.4 pg (ref 26.0–34.0)
MCHC: 31.8 g/dL (ref 30.0–36.0)
MCV: 89.2 fL (ref 80.0–100.0)
Monocytes Absolute: 0.6 10*3/uL (ref 0.1–1.0)
Monocytes Relative: 7 %
Neutro Abs: 4.3 10*3/uL (ref 1.7–7.7)
Neutrophils Relative %: 57 %
Platelets: 352 10*3/uL (ref 150–400)
RBC: 4.62 MIL/uL (ref 3.87–5.11)
RDW: 15 % (ref 11.5–15.5)
WBC: 7.7 10*3/uL (ref 4.0–10.5)
nRBC: 0 % (ref 0.0–0.2)

## 2023-02-05 LAB — URINALYSIS, ROUTINE W REFLEX MICROSCOPIC
Bilirubin Urine: NEGATIVE
Glucose, UA: NEGATIVE mg/dL
Hgb urine dipstick: NEGATIVE
Ketones, ur: NEGATIVE mg/dL
Leukocytes,Ua: NEGATIVE
Nitrite: NEGATIVE
Protein, ur: NEGATIVE mg/dL
Specific Gravity, Urine: 1.027 (ref 1.005–1.030)
pH: 5 (ref 5.0–8.0)

## 2023-02-05 LAB — LACTIC ACID, PLASMA: Lactic Acid, Venous: 1.3 mmol/L (ref 0.5–1.9)

## 2023-02-05 LAB — LIPASE, BLOOD: Lipase: 27 U/L (ref 11–51)

## 2023-02-05 LAB — POC URINE PREG, ED: Preg Test, Ur: NEGATIVE

## 2023-02-05 MED ORDER — FENTANYL CITRATE PF 50 MCG/ML IJ SOSY
50.0000 ug | PREFILLED_SYRINGE | Freq: Once | INTRAMUSCULAR | Status: AC
  Administered 2023-02-05: 50 ug via INTRAVENOUS
  Filled 2023-02-05: qty 1

## 2023-02-05 MED ORDER — SODIUM CHLORIDE 0.9 % IV BOLUS
1000.0000 mL | Freq: Once | INTRAVENOUS | Status: AC
  Administered 2023-02-05: 1000 mL via INTRAVENOUS

## 2023-02-05 MED ORDER — DICYCLOMINE HCL 10 MG PO CAPS: 10.0000 mg | ORAL_CAPSULE | Freq: Three times a day (TID) | ORAL | 1 refills | Status: DC

## 2023-02-05 MED ORDER — ONDANSETRON 8 MG PO TBDP: 8.0000 mg | ORAL_TABLET | Freq: Three times a day (TID) | ORAL | 0 refills | Status: DC | PRN

## 2023-02-05 NOTE — ED Triage Notes (Signed)
Patient arrives ambualtory to ER via [facility] for sudden onset central abdominal and lower back pain starting this AM at approximately 03:00 AM.  Currently denies any nausea or vomiting.  Endorses multiple episodes of diarrhea this morning.   Patient has had several episodes similar to this in the past year and undergone numerous workup revealing cholelithiasis and was recommended to undergo cholecystectomy.

## 2023-02-05 NOTE — ED Provider Notes (Signed)
[facility] Provider Note   Event Date/Time   First MD Initiated Contact with Patient 02/05/23 0700     (approximate) History  Abdominal Pain  HPI Victoria Villarreal is a 27 y.o. female with a known past medical history of symptomatic cholelithiasis who presents complaining of epigastric and right upper quadrant abdominal pain that began this morning after eating with associated nausea/vomiting.  Patient states that this pain is similar to gallbladder colic that she has had in the past with 8/10, burning/aching pain that radiates through to her back. ROS: Patient currently denies any vision changes, tinnitus, difficulty speaking, facial droop, sore throat, chest pain, shortness of breath, diarrhea, dysuria, or weakness/numbness/paresthesias in any extremity   Physical Exam  Triage Vital Signs: ED Triage Vitals [02/05/23 0702]  Enc Vitals Group     BP      Pulse      Resp      Temp      Temp src      SpO2      Weight 197 lb (89.4 kg)     Height      Head Circumference      Peak Flow      Pain Score 8     Pain Loc      Pain Edu?      Excl. in GC?    Most recent vital signs: Vitals:   02/05/23 0702 02/05/23 0830  BP: 114/80 109/71  Pulse: (!) 59 (!) 54  Resp:  18  Temp: 97.9 F (36.6 C)   SpO2: 99% 97%   General: Asleep and easily awoken with voice, oriented x4. CV:  Good peripheral perfusion.  Resp:  Normal effort.  Abd:  No distention.  Mild right upper quadrant tenderness to palpation Other:  Young adult African-American female laying in bed asleep  ED Results / Procedures / Treatments  Labs (all labs ordered are listed, but only abnormal results are displayed) Labs Reviewed  COMPREHENSIVE METABOLIC PANEL - Abnormal; Notable for the following components:      Result Value   Glucose, Bld 117 (*)    Calcium 8.8 (*)    All other components within normal limits  URINALYSIS, ROUTINE W REFLEX MICROSCOPIC - Abnormal; Notable for the following  components:   Color, Urine YELLOW (*)    APPearance HAZY (*)    All other components within normal limits  URINE DRUG SCREEN, QUALITATIVE ([facility] ONLY) - Abnormal; Notable for the following components:   Tricyclic, Ur Screen POSITIVE (*)    Cocaine Metabolite,Ur [place] POSITIVE (*)    Cannabinoid 50 Ng, Ur  POSITIVE (*)    All other components within normal limits  LIPASE, BLOOD  CBC WITH DIFFERENTIAL/PLATELET  LACTIC ACID, PLASMA  LACTIC ACID, PLASMA  POC URINE PREG, ED   EKG ED ECG REPORT I, Merwyn Katos, the attending physician, personally viewed and interpreted this ECG. Date: 02/05/2023 EKG Time: 0804 Rate: 54 Rhythm: normal sinus rhythm QRS Axis: normal Intervals: normal ST/T Wave abnormalities: normal Narrative Interpretation: no evidence of acute ischemia RADIOLOGY ED MD interpretation: Right upper quadrant ultrasound interpreted independently by me shows gallstones without any gallbladder wall thickening, pericholecystic fluid, or sonographic Murphy sign -Agree with radiology assessment Official radiology report(s): US Abdomen Limited RUQ (LIVER/GB)  Result Date: 02/05/2023 CLINICAL DATA:  Gallstones. EXAM: ULTRASOUND ABDOMEN LIMITED RIGHT UPPER QUADRANT COMPARISON:  06/26/2022 FINDINGS: Gallbladder: Stone within the gallbladder fundus measures 7 mm. No gallbladder wall thickening, pericholecystic fluid or sonographic  Murphy's sign. Common bile duct: Diameter: 2 mm Liver: No focal lesion identified. Within normal limits in parenchymal echogenicity. Portal vein is patent on color Doppler imaging with normal direction of blood flow towards the liver. Other: None. IMPRESSION: Gallstone. No gallbladder wall thickening, pericholecystic fluid or sonographic Murphy's sign. Electronically Signed   By: Signa Kell M.D.   On: 02/05/2023 08:14   PROCEDURES: Critical Care performed: No .1-3 Lead EKG Interpretation  Performed by: Merwyn Katos, MD Authorized by: Merwyn Katos,  MD     Interpretation: normal     ECG rate:  71   ECG rate assessment: normal     Rhythm: sinus rhythm     Ectopy: none     Conduction: normal    MEDICATIONS ORDERED IN ED: Medications  sodium chloride 0.9 % bolus 1,000 mL (0 mLs Intravenous Stopped 02/05/23 0847)  fentaNYL (SUBLIMAZE) injection 50 mcg (50 mcg Intravenous Given 02/05/23 0727)   IMPRESSION / MDM / ASSESSMENT AND PLAN / ED COURSE  I reviewed the triage vital signs and the nursing notes.                             The patient is on the cardiac monitor to evaluate for evidence of arrhythmia and/or significant heart rate changes. Patient's presentation is most consistent with acute presentation with potential threat to life or bodily function. Presentation most consistent with biliary colic, without evidence of infection. History and exam AAA, pancreatitis, SBO, appendicitis, mesenteric ischemia, nephrolithiasis, pyelonephritis, or diverticulitis.  ED Interventions: analgesia PRN ED Workup: CBC, BMP, LFTs, Lipase, Abdominal US  Disposition: Refer to general surgery outpatient. Discharge home with SRP and instructions for primary care follow up within 24-48 hours.   FINAL CLINICAL IMPRESSION(S) / ED DIAGNOSES   Final diagnoses:  Epigastric pain  Symptomatic cholelithiasis   Rx / DC Orders   ED Discharge Orders          Ordered    dicyclomine (BENTYL) 10 MG capsule  3 times daily before meals & bedtime        02/05/23 0915    ondansetron (ZOFRAN-ODT) 8 MG disintegrating tablet  Every 8 hours PRN        02/05/23 0915           Note:  This document was prepared using Dragon voice recognition software and may include unintentional dictation errors.   Merwyn Katos, MD 02/05/23 [phone]

## 2023-02-14 ENCOUNTER — Emergency Department
Admission: EM | Admit: 2023-02-14 | Discharge: 2023-02-14 | Discharge status: Against Medical Advice | Payer: Medicaid Other | Attending: Emergency Medicine | Admitting: Emergency Medicine

## 2023-02-14 ENCOUNTER — Other Ambulatory Visit: Payer: Self-pay

## 2023-02-14 DIAGNOSIS — Z5321 Procedure and treatment not carried out due to patient leaving prior to being seen by health care provider: Secondary | ICD-10-CM | POA: Insufficient documentation

## 2023-02-14 DIAGNOSIS — M549 Dorsalgia, unspecified: Secondary | ICD-10-CM | POA: Insufficient documentation

## 2023-02-14 DIAGNOSIS — R1030 Lower abdominal pain, unspecified: Secondary | ICD-10-CM | POA: Insufficient documentation

## 2023-02-14 LAB — CBC WITH DIFFERENTIAL/PLATELET
Abs Immature Granulocytes: 0.01 10*3/uL (ref 0.00–0.07)
Basophils Absolute: 0 10*3/uL (ref 0.0–0.1)
Basophils Relative: 0 %
Eosinophils Absolute: 0.5 10*3/uL (ref 0.0–0.5)
Eosinophils Relative: 6 %
HCT: 40.2 % (ref 36.0–46.0)
Hemoglobin: 12.6 g/dL (ref 12.0–15.0)
Immature Granulocytes: 0 %
Lymphocytes Relative: 58 %
Lymphs Abs: 4.4 10*3/uL — ABNORMAL HIGH (ref 0.7–4.0)
MCH: 28.8 pg (ref 26.0–34.0)
MCHC: 31.3 g/dL (ref 30.0–36.0)
MCV: 91.8 fL (ref 80.0–100.0)
Monocytes Absolute: 0.8 10*3/uL (ref 0.1–1.0)
Monocytes Relative: 10 %
Neutro Abs: 2.1 10*3/uL (ref 1.7–7.7)
Neutrophils Relative %: 26 %
Platelets: 351 10*3/uL (ref 150–400)
RBC: 4.38 MIL/uL (ref 3.87–5.11)
RDW: 15 % (ref 11.5–15.5)
WBC: 7.8 10*3/uL (ref 4.0–10.5)
nRBC: 0 % (ref 0.0–0.2)

## 2023-02-14 LAB — COMPREHENSIVE METABOLIC PANEL
ALT: 15 U/L (ref 0–44)
AST: 18 U/L (ref 15–41)
Albumin: 3.6 g/dL (ref 3.5–5.0)
Alkaline Phosphatase: 50 U/L (ref 38–126)
Anion gap: 8 (ref 5–15)
BUN: 11 mg/dL (ref 6–20)
CO2: 26 mmol/L (ref 22–32)
Calcium: 8.6 mg/dL — ABNORMAL LOW (ref 8.9–10.3)
Chloride: 107 mmol/L (ref 98–111)
Creatinine, Ser: 0.75 mg/dL (ref 0.44–1.00)
GFR, Estimated: >60 mL/min (ref >60)
Glucose, Bld: 126 mg/dL — ABNORMAL HIGH (ref 70–99)
Potassium: 3.5 mmol/L (ref 3.5–5.1)
Sodium: 141 mmol/L (ref 135–145)
Total Bilirubin: 0.5 mg/dL (ref 0.3–1.2)
Total Protein: 7.3 g/dL (ref 6.5–8.1)

## 2023-02-14 LAB — LIPASE, BLOOD: Lipase: 30 U/L (ref 11–51)

## 2023-02-14 NOTE — ED Notes (Signed)
This RN called patient to room her. Pt states "I'm not even hurting no more and I can get my medicine filled at the pharmacy now". This RN explained patient going back to a room. Pt states she just needed a minute to notify her ride that she was going back to a room. This RN visualized patient walk out of ED, walk up to a vehicle, get in and the vehicle drive away, pt did not return and was aware that this RN was calling patient to take her to a room.

## 2023-02-14 NOTE — ED Triage Notes (Signed)
Pt arrives via [facility] with CC of lower abdominal and back pain that has been ongoing and pt relates to hx of gallstones. Pt denies N/V at this time.

## 2023-02-18 ENCOUNTER — Other Ambulatory Visit: Payer: Self-pay

## 2023-02-18 ENCOUNTER — Encounter: Payer: Self-pay | Admitting: Emergency Medicine

## 2023-02-18 ENCOUNTER — Emergency Department
Admission: EM | Admit: 2023-02-18 | Discharge: 2023-02-18 | Discharge status: Against Medical Advice | Payer: Medicaid Other | Attending: Emergency Medicine | Admitting: Emergency Medicine

## 2023-02-18 DIAGNOSIS — R1030 Lower abdominal pain, unspecified: Secondary | ICD-10-CM | POA: Insufficient documentation

## 2023-02-18 DIAGNOSIS — R110 Nausea: Secondary | ICD-10-CM | POA: Insufficient documentation

## 2023-02-18 DIAGNOSIS — Z5321 Procedure and treatment not carried out due to patient leaving prior to being seen by health care provider: Secondary | ICD-10-CM | POA: Diagnosis not present

## 2023-02-18 LAB — COMPREHENSIVE METABOLIC PANEL
ALT: 16 U/L (ref 0–44)
AST: 20 U/L (ref 15–41)
Albumin: 4.2 g/dL (ref 3.5–5.0)
Alkaline Phosphatase: 58 U/L (ref 38–126)
Anion gap: 8 (ref 5–15)
BUN: 11 mg/dL (ref 6–20)
CO2: 21 mmol/L — ABNORMAL LOW (ref 22–32)
Calcium: 9.3 mg/dL (ref 8.9–10.3)
Chloride: 106 mmol/L (ref 98–111)
Creatinine, Ser: 0.65 mg/dL (ref 0.44–1.00)
GFR, Estimated: >60 mL/min (ref >60)
Glucose, Bld: 152 mg/dL — ABNORMAL HIGH (ref 70–99)
Potassium: 4 mmol/L (ref 3.5–5.1)
Sodium: 135 mmol/L (ref 135–145)
Total Bilirubin: 0.7 mg/dL (ref 0.3–1.2)
Total Protein: 8.3 g/dL — ABNORMAL HIGH (ref 6.5–8.1)

## 2023-02-18 LAB — CBC WITH DIFFERENTIAL/PLATELET
Abs Immature Granulocytes: 0.01 10*3/uL (ref 0.00–0.07)
Basophils Absolute: 0 10*3/uL (ref 0.0–0.1)
Basophils Relative: 0 %
Eosinophils Absolute: 0.4 10*3/uL (ref 0.0–0.5)
Eosinophils Relative: 5 %
HCT: 41.1 % (ref 36.0–46.0)
Hemoglobin: 13 g/dL (ref 12.0–15.0)
Immature Granulocytes: 0 %
Lymphocytes Relative: 56 %
Lymphs Abs: 4 10*3/uL (ref 0.7–4.0)
MCH: 28.1 pg (ref 26.0–34.0)
MCHC: 31.6 g/dL (ref 30.0–36.0)
MCV: 89 fL (ref 80.0–100.0)
Monocytes Absolute: 0.6 10*3/uL (ref 0.1–1.0)
Monocytes Relative: 8 %
Neutro Abs: 2.2 10*3/uL (ref 1.7–7.7)
Neutrophils Relative %: 31 %
Platelets: 365 10*3/uL (ref 150–400)
RBC: 4.62 MIL/uL (ref 3.87–5.11)
RDW: 15 % (ref 11.5–15.5)
WBC: 7.2 10*3/uL (ref 4.0–10.5)
nRBC: 0 % (ref 0.0–0.2)

## 2023-02-18 LAB — LIPASE, BLOOD: Lipase: 40 U/L (ref 11–51)

## 2023-02-18 NOTE — ED Triage Notes (Signed)
Pt to triage via w/c, tearful; reports lower abd pain radiating into back accomp by nausea tonight; st hx gallstones

## 2023-03-28 ENCOUNTER — Emergency Department
Admission: EM | Admit: 2023-03-28 | Discharge: 2023-03-28 | Disposition: A | Discharge status: Home / Self Care | Payer: Medicaid Other | Attending: Emergency Medicine | Admitting: Emergency Medicine

## 2023-03-28 ENCOUNTER — Other Ambulatory Visit: Payer: Self-pay

## 2023-03-28 ENCOUNTER — Encounter: Payer: Self-pay | Admitting: Emergency Medicine

## 2023-03-28 DIAGNOSIS — R1011 Right upper quadrant pain: Secondary | ICD-10-CM | POA: Insufficient documentation

## 2023-03-28 DIAGNOSIS — R1013 Epigastric pain: Secondary | ICD-10-CM | POA: Diagnosis present

## 2023-03-28 LAB — CBC
HCT: 43 % (ref 36.0–46.0)
Hemoglobin: 13.6 g/dL (ref 12.0–15.0)
MCH: 28 pg (ref 26.0–34.0)
MCHC: 31.6 g/dL (ref 30.0–36.0)
MCV: 88.7 fL (ref 80.0–100.0)
Platelets: 329 10*3/uL (ref 150–400)
RBC: 4.85 MIL/uL (ref 3.87–5.11)
RDW: 14.3 % (ref 11.5–15.5)
WBC: 8.3 10*3/uL (ref 4.0–10.5)
nRBC: 0 % (ref 0.0–0.2)

## 2023-03-28 LAB — POC URINE PREG, ED: Preg Test, Ur: NEGATIVE

## 2023-03-28 LAB — COMPREHENSIVE METABOLIC PANEL
ALT: 13 U/L (ref 0–44)
AST: 15 U/L (ref 15–41)
Albumin: 3.9 g/dL (ref 3.5–5.0)
Alkaline Phosphatase: 51 U/L (ref 38–126)
Anion gap: 10 (ref 5–15)
BUN: 12 mg/dL (ref 6–20)
CO2: 22 mmol/L (ref 22–32)
Calcium: 8.8 mg/dL — ABNORMAL LOW (ref 8.9–10.3)
Chloride: 106 mmol/L (ref 98–111)
Creatinine, Ser: 0.68 mg/dL (ref 0.44–1.00)
GFR, Estimated: >60 mL/min (ref >60)
Glucose, Bld: 126 mg/dL — ABNORMAL HIGH (ref 70–99)
Potassium: 3.7 mmol/L (ref 3.5–5.1)
Sodium: 138 mmol/L (ref 135–145)
Total Bilirubin: 0.5 mg/dL (ref 0.3–1.2)
Total Protein: 7.4 g/dL (ref 6.5–8.1)

## 2023-03-28 LAB — URINALYSIS, ROUTINE W REFLEX MICROSCOPIC
Bilirubin Urine: NEGATIVE
Glucose, UA: NEGATIVE mg/dL
Hgb urine dipstick: NEGATIVE
Ketones, ur: NEGATIVE mg/dL
Nitrite: NEGATIVE
Protein, ur: NEGATIVE mg/dL
Specific Gravity, Urine: 1.025 (ref 1.005–1.030)
pH: 5 (ref 5.0–8.0)

## 2023-03-28 LAB — LIPASE, BLOOD: Lipase: 28 U/L (ref 11–51)

## 2023-03-28 MED ORDER — DROPERIDOL 2.5 MG/ML IJ SOLN
2.5000 mg | Freq: Once | INTRAMUSCULAR | Status: AC
  Administered 2023-03-28: 2.5 mg via INTRAVENOUS
  Filled 2023-03-28: qty 2

## 2023-03-28 MED ORDER — KETOROLAC TROMETHAMINE 30 MG/ML IJ SOLN
15.0000 mg | Freq: Once | INTRAMUSCULAR | Status: AC
  Administered 2023-03-28: 15 mg via INTRAVENOUS
  Filled 2023-03-28: qty 1

## 2023-03-28 NOTE — Discharge Instructions (Signed)
Your blood work was all reassuring.  Your pain could be related to marijuana use or your gallstones.  Your pain returns and is not going away or you not able to keep fluids down or you develop fever then please return to the emergency department.  I recommend refraining from marijuana use as this can cause worsening abdominal pain and vomiting.

## 2023-03-28 NOTE — ED Triage Notes (Signed)
Pt to ED via POV, pt ambulatory into ED without difficulty or distress. In triage pt is crying stating "I have a gallstone and my stomach hurt". Pt states that she was supposed to see a surgeon last year but she did not call to schedule the appointment. Pt states that her pain started around 0800. Pt states that she has also been vomiting, sweating, and shaking.

## 2023-03-28 NOTE — ED Provider Notes (Signed)
[facility] Provider Note    Event Date/Time   First MD Initiated Contact with Patient 03/28/23 1101     (approximate)   History   Abdominal Pain   HPI  Victoria Villarreal is a 27 y.o. female with past medical history of symptomatic cholelithiasis who presents with upper abdominal pain.  Symptoms started around 8 AM this morning.  It is a throbbing pain that is constant.  Pain is located both in the upper epigastric region and the lower abdomen.  Has been vomiting.  Did have BM this morning no diarrhea.  Denies fevers or chills.  Denies dysuria urgency frequency or abnormal vaginal discharge.  This does feel similar to prior episodes of abdominal pain she has had.  Does use marijuana regularly.  Patient was seen in the ED about 2 months ago for upper abdominal pain and had a right upper artery ultrasound that showed a gallstone but no evidence of cholecystitis.  She has seen general surgery in the past and was post to have her gallbladder removed for symptomatic cholelithiasis but never had surgery, she tells me because she had drugs in her system.    Past Medical History:  Diagnosis Date   Anemia    Depression    Polysubstance abuse (HCC)    a.) marijuana + cocaine   Pyelonephritis    Trichomoniasis     Patient Active Problem List   Diagnosis Date Noted   Abnormal LFTs of undetermined etiology 06/27/2022   History of E. coli pyelonephritis 06/21/2022 06/26/2022   Acute hepatitis 06/26/2022   Pyelonephritis 06/22/2022   Abdominal pain 06/21/2022   Transaminitis 06/21/2022   Acute pyelonephritis 06/21/2022   Anxiety 06/21/2022   Abdominal pain, right upper quadrant    Normal labor and delivery 04/23/2022   Gestational hypertension 04/20/2022   Elevated glucose tolerance test 04/20/2022   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 04/20/2022   Tobacco smoking affecting pregnancy in third trimester 11/18/2021   History of preterm delivery, currently  pregnant in second trimester 11/18/2021   Supervision of high risk pregnancy in third trimester 11/10/2021     Physical Exam  Triage Vital Signs: ED Triage Vitals [03/28/23 1029]  Enc Vitals Group     BP (!) 140/110     Pulse Rate 81     Resp 16     Temp 97.9 F (36.6 C)     Temp Source Oral     SpO2 99 %     Weight 195 lb (88.5 kg)     Height 5\' 7"  (1.702 m)     Head Circumference      Peak Flow      Pain Score 10     Pain Loc      Pain Edu?      Excl. in GC?     Most recent vital signs: Vitals:   03/28/23 1029  BP: (!) 140/110  Pulse: 81  Resp: 16  Temp: 97.9 F (36.6 C)  SpO2: 99%     General: Awake, patient appears uncomfortable holding emesis bag CV:  Good peripheral perfusion.  Resp:  Normal effort.  Abd:  No distention.  Abdomen is soft there is tenderness both in the epigastric region and suprapubic region and right upper quadrant but no guarding Neuro:             Awake, Alert, Oriented x 3  Other:     ED Results / Procedures / Treatments  Labs (all labs  ordered are listed, but only abnormal results are displayed) Labs Reviewed  COMPREHENSIVE METABOLIC PANEL - Abnormal; Notable for the following components:      Result Value   Glucose, Bld 126 (*)    Calcium 8.8 (*)    All other components within normal limits  URINALYSIS, ROUTINE W REFLEX MICROSCOPIC - Abnormal; Notable for the following components:   Color, Urine YELLOW (*)    APPearance HAZY (*)    Leukocytes,Ua SMALL (*)    Bacteria, UA FEW (*)    All other components within normal limits  LIPASE, BLOOD  CBC  POC URINE PREG, ED     EKG     RADIOLOGY I personally performed and interpreted bedside ultrasound of the right upper quadrant which shows a small gallstone but no pericholecystic fluid gallbladder wall of 2 mm and negative Murphy sign   PROCEDURES:  Critical Care performed: No  Procedures   MEDICATIONS ORDERED IN ED: Medications  droperidol (INAPSINE) 2.5 MG/ML  injection 2.5 mg (2.5 mg Intravenous Given 03/28/23 1130)  ketorolac (TORADOL) 30 MG/ML injection 15 mg (15 mg Intravenous Given 03/28/23 1130)     IMPRESSION / MDM / ASSESSMENT AND PLAN / ED COURSE  I reviewed the triage vital signs and the nursing notes.                              Patient's presentation is most consistent with acute complicated illness / injury requiring diagnostic workup.  Differential diagnosis includes, but is not limited to, symptomatic cholelithiasis, cholecystitis, pancreatitis, gastritis, cannabinol hyperemesis syndrome, pyelonephritis  Patient is a 27 year old female with history of gallstones who presents because of abdominal pain that started around 8 AM today.  It is located both in the epigastric region and lower abdomen is associated with nausea vomiting and is constant pain.  Has not eaten today.  She has history of symptomatic cholelithiasis and was supposed to get her gallbladder removed last November but the surgery was canceled because her UDS was positive for marijuana.  Patient does continue to use marijuana regularly.  Hypertensive on arrival afebrile vital signs otherwise reassuring.  She does look uncomfortable and is holding an emesis bag full of vomit.  Abdominal exam overall is benign she is tender both in the epigastric region and right upper quadrant there is no guarding negative Eulah Pont sign she also has some suprapubic tenderness. Suspect either symptomatic cholelithiasis or that this is cannabinol hyperemesis syndrome.  His labs reveal normal white count no LFT abnormalities normal lipase.  Plan to give Toradol droperidol and I will perform a bedside ultrasound of the right upper quadrant to evaluate for cholecystitis.  I did perform a bedside ultrasound of the right upper quadrant.  Patient does have a small gallstones but no gallbladder wall thickening no pericholecystic fluid and a negative Murphy sign.  Overall this is not consistent with  cholecystitis.  After droperidol and Toradol patient did quickly improve which makes me suspicious that this is potentially cannabinoid related.  Discussed refraining from marijuana use.  I have also given her Dr. Adelene Idler contact information, as he was who she had followed up with in the past.       FINAL CLINICAL IMPRESSION(S) / ED DIAGNOSES   Final diagnoses:  Epigastric pain     Rx / DC Orders   ED Discharge Orders     None        Note:  This document was  prepared using Conservation officer, historic buildings and may include unintentional dictation errors.   Georga Hacking, MD 03/28/23 [phone]

## 2023-04-02 ENCOUNTER — Emergency Department
Admission: EM | Admit: 2023-04-02 | Discharge: 2023-04-02 | Disposition: A | Discharge status: Home / Self Care | Payer: Medicaid Other | Attending: Emergency Medicine | Admitting: Emergency Medicine

## 2023-04-02 ENCOUNTER — Other Ambulatory Visit: Payer: Self-pay

## 2023-04-02 DIAGNOSIS — N3000 Acute cystitis without hematuria: Secondary | ICD-10-CM | POA: Diagnosis not present

## 2023-04-02 DIAGNOSIS — R112 Nausea with vomiting, unspecified: Secondary | ICD-10-CM

## 2023-04-02 DIAGNOSIS — R1084 Generalized abdominal pain: Secondary | ICD-10-CM | POA: Diagnosis present

## 2023-04-02 LAB — URINALYSIS, ROUTINE W REFLEX MICROSCOPIC
Bilirubin Urine: NEGATIVE
Glucose, UA: NEGATIVE mg/dL
Hgb urine dipstick: NEGATIVE
Ketones, ur: 5 mg/dL — AB
Nitrite: POSITIVE — AB
Protein, ur: NEGATIVE mg/dL
Specific Gravity, Urine: 1.018 (ref 1.005–1.030)
pH: 6 (ref 5.0–8.0)

## 2023-04-02 LAB — COMPREHENSIVE METABOLIC PANEL
ALT: 16 U/L (ref 0–44)
AST: 17 U/L (ref 15–41)
Albumin: 3.7 g/dL (ref 3.5–5.0)
Alkaline Phosphatase: 52 U/L (ref 38–126)
Anion gap: 6 (ref 5–15)
BUN: 7 mg/dL (ref 6–20)
CO2: 23 mmol/L (ref 22–32)
Calcium: 8.6 mg/dL — ABNORMAL LOW (ref 8.9–10.3)
Chloride: 106 mmol/L (ref 98–111)
Creatinine, Ser: 0.63 mg/dL (ref 0.44–1.00)
GFR, Estimated: >60 mL/min (ref >60)
Glucose, Bld: 165 mg/dL — ABNORMAL HIGH (ref 70–99)
Potassium: 3.6 mmol/L (ref 3.5–5.1)
Sodium: 135 mmol/L (ref 135–145)
Total Bilirubin: 0.2 mg/dL — ABNORMAL LOW (ref 0.3–1.2)
Total Protein: 7.7 g/dL (ref 6.5–8.1)

## 2023-04-02 LAB — LIPASE, BLOOD: Lipase: 24 U/L (ref 11–51)

## 2023-04-02 LAB — CBC
HCT: 41.3 % (ref 36.0–46.0)
Hemoglobin: 12.8 g/dL (ref 12.0–15.0)
MCH: 27.9 pg (ref 26.0–34.0)
MCHC: 31 g/dL (ref 30.0–36.0)
MCV: 90.2 fL (ref 80.0–100.0)
Platelets: 340 10*3/uL (ref 150–400)
RBC: 4.58 MIL/uL (ref 3.87–5.11)
RDW: 14.7 % (ref 11.5–15.5)
WBC: 8.1 10*3/uL (ref 4.0–10.5)
nRBC: 0 % (ref 0.0–0.2)

## 2023-04-02 MED ORDER — KETOROLAC TROMETHAMINE 30 MG/ML IJ SOLN
15.0000 mg | Freq: Once | INTRAMUSCULAR | Status: AC
  Administered 2023-04-02: 15 mg via INTRAVENOUS
  Filled 2023-04-02: qty 1

## 2023-04-02 MED ORDER — CEPHALEXIN 500 MG PO CAPS: 500.0000 mg | ORAL_CAPSULE | Freq: Three times a day (TID) | ORAL | 0 refills | Status: AC

## 2023-04-02 MED ORDER — DROPERIDOL 2.5 MG/ML IJ SOLN
2.5000 mg | Freq: Once | INTRAMUSCULAR | Status: AC
  Administered 2023-04-02: 2.5 mg via INTRAVENOUS
  Filled 2023-04-02: qty 2

## 2023-04-02 NOTE — ED Triage Notes (Addendum)
Pt brought in by [facility] with co mid abd states no hx of the same. Pt given zofran and fentanyl by EMS. States pain started at 0400 with vomiting no diarrhea. WAs here recently for the same and told she has gallstones. Has apprm with surgery on Friday.

## 2023-04-02 NOTE — ED Provider Notes (Signed)
[facility] Provider Note    Event Date/Time   First MD Initiated Contact with Patient 04/02/23 0809     (approximate)   History   Abdominal Pain   HPI  FONTAINE KOSSMAN is a 27 y.o. female with past medical history of symptomatic cholelithiasis, pyelonephritis, transaminitis, polysubstance abuse and as listed in EMR presents to the emergency department for treatment and evaluation of diffuse abdominal pain and vomiting.  Since about 4 AM.  Patient states that this pain is exactly like how she feels with her symptomatic gallstones.  She denies dysuria or urinary frequency.  She does smoke marijuana regularly.     Physical Exam   Triage Vital Signs: ED Triage Vitals [04/02/23 0743]  Enc Vitals Group     BP (!) 148/102     Pulse Rate 66     Resp 18     Temp 98.2 F (36.8 C)     Temp Source Oral     SpO2 100 %     Weight      Height      Head Circumference      Peak Flow      Pain Score 10     Pain Loc      Pain Edu?      Excl. in GC?     Most recent vital signs: Vitals:   04/02/23 0743 04/02/23 0958  BP: (!) 148/102 130/73  Pulse: 66 64  Resp: 18 17  Temp: 98.2 F (36.8 C) 98.2 F (36.8 C)  SpO2: 100% 99%    General: Awake, no distress.  CV:  Good peripheral perfusion.  Resp:  Normal effort.  Breath sounds are clear Abd:  No distention.  Abdomen is soft.  Bowel sounds are present x 4 quadrants. Other:     ED Results / Procedures / Treatments   Labs (all labs ordered are listed, but only abnormal results are displayed) Labs Reviewed  COMPREHENSIVE METABOLIC PANEL - Abnormal; Notable for the following components:      Result Value   Glucose, Bld 165 (*)    Calcium 8.6 (*)    Total Bilirubin 0.2 (*)    All other components within normal limits  URINALYSIS, ROUTINE W REFLEX MICROSCOPIC - Abnormal; Notable for the following components:   Color, Urine YELLOW (*)    APPearance HAZY (*)    Ketones, ur 5 (*)    Nitrite POSITIVE  (*)    Leukocytes,Ua SMALL (*)    Bacteria, UA MANY (*)    All other components within normal limits  CBC  LIPASE, BLOOD  POC URINE PREG, ED     EKG  Not indicated   RADIOLOGY  Image and radiology report reviewed and interpreted by me. Radiology report consistent with the same.  Indicated  PROCEDURES:  Critical Care performed: No  Procedures   MEDICATIONS ORDERED IN ED:  Medications  droperidol (INAPSINE) 2.5 MG/ML injection 2.5 mg (2.5 mg Intravenous Given 04/02/23 0840)  ketorolac (TORADOL) 30 MG/ML injection 15 mg (15 mg Intravenous Given 04/02/23 0839)     IMPRESSION / MDM / ASSESSMENT AND PLAN / ED COURSE   I have reviewed the triage note.  Differential diagnosis includes, but is not limited to, symptomatic cholelithiasis, cholecystitis, cannabinoid hyperemesis syndrome pyelonephritis  Patient's presentation is most consistent with acute complicated illness / injury requiring diagnostic workup.  27 year old female presenting to the emergency department for treatment and evaluation of vomiting and abdominal pain since about 4  AM.  She also reports 2 episodes of vomiting.  She feels that this is related to her gallstones.  She denies any fever.  On exam, she does not have any focal abdominal tenderness including right upper quadrant or epigastric area.   She tells me that the medication she was given last time she was here worked well. Review of note from 6/16 she received droperidol and Toradol. This seems reasonable as she is reporting the same symptoms.   No indication for [place] today. She has a normal WBC count, and pain symptoms are now controlled.  Urinalysis is consistent with acute cystitis and will be treated with Keflex. Otherwise labs are unremarkable.  She is to call and schedule an appointment with primary care for recurrent abdominal pain or surgery as previously advised at last 2 ER visits.      FINAL CLINICAL IMPRESSION(S) / ED DIAGNOSES    Final diagnoses:  Acute cystitis without hematuria  Nausea and vomiting, unspecified vomiting type     Rx / DC Orders   ED Discharge Orders          Ordered    cephALEXin (KEFLEX) 500 MG capsule  3 times daily        04/02/23 1002             Note:  This document was prepared using Dragon voice recognition software and may include unintentional dictation errors.   Chinita Pester, FNP 04/02/23 1049    Merwyn Katos, MD 04/02/23 [phone]

## 2023-04-09 ENCOUNTER — Ambulatory Visit: Payer: Medicaid Other | Admitting: Surgery

## 2023-04-12 ENCOUNTER — Encounter: Payer: Self-pay | Admitting: Surgery

## 2023-04-12 ENCOUNTER — Ambulatory Visit (INDEPENDENT_AMBULATORY_CARE_PROVIDER_SITE_OTHER): Payer: Medicaid Other | Admitting: Surgery

## 2023-04-12 VITALS — BP 150/90 | HR 79 | Temp 98.0°F | Ht 67.0 in | Wt 189.0 lb

## 2023-04-12 DIAGNOSIS — K8020 Calculus of gallbladder without cholecystitis without obstruction: Secondary | ICD-10-CM

## 2023-04-12 NOTE — Patient Instructions (Signed)
You have requested to have your gallbladder removed. This will be done at [facility] with Dr. Piscoya.  You will most likely be out of work 1-2 weeks for this surgery.  If you have FMLA or disability paperwork that needs filled out you may drop this off at our office or this can be faxed to [phone].  You will return after your post-op appointment with a lifting restriction for approximately 4 more weeks.  You will be able to eat anything you would like to following surgery. But, start by eating a bland diet and advance this as tolerated. The Gallbladder diet is below, please go as closely by this diet as possible prior to surgery to avoid any further attacks.  Please see the (blue)pre-care form that you have been given today. Our surgery scheduler will call you to verify surgery date and to go over information.   If you have any questions, please call our office.  Laparoscopic Cholecystectomy Laparoscopic cholecystectomy is surgery to remove the gallbladder. The gallbladder is located in the upper right part of the abdomen, behind the liver. It is a storage sac for bile, which is produced in the liver. Bile aids in the digestion and absorption of fats. Cholecystectomy is often done for inflammation of the gallbladder (cholecystitis). This condition is usually caused by a buildup of gallstones (cholelithiasis) in the gallbladder. Gallstones can block the flow of bile, and that can result in inflammation and pain. In severe cases, emergency surgery may be required. If emergency surgery is not required, you will have time to prepare for the procedure. Laparoscopic surgery is an alternative to open surgery. Laparoscopic surgery has a shorter recovery time. Your common bile duct may also need to be examined during the procedure. If stones are found in the common bile duct, they may be removed. LET YOUR HEALTH CARE PROVIDER KNOW ABOUT: Any allergies you have. All medicines you are taking,  including vitamins, herbs, eye drops, creams, and over-the-counter medicines. Previous problems you or members of your family have had with the use of anesthetics. Any blood disorders you have. Previous surgeries you have had.  Any medical conditions you have. RISKS AND COMPLICATIONS Generally, this is a safe procedure. However, problems may occur, including: Infection. Bleeding. Allergic reactions to medicines. Damage to other structures or organs. A stone remaining in the common bile duct. A bile leak from the cyst duct that is clipped when your gallbladder is removed. The need to convert to open surgery, which requires a larger incision in the abdomen. This may be necessary if your surgeon thinks that it is not safe to continue with a laparoscopic procedure. BEFORE THE PROCEDURE Ask your health care provider about: Changing or stopping your regular medicines. This is especially important if you are taking diabetes medicines or blood thinners. Taking medicines such as aspirin and ibuprofen. These medicines can thin your blood. Do not take these medicines before your procedure if your health care provider instructs you not to. Follow instructions from your health care provider about eating or drinking restrictions. Let your health care provider know if you develop a cold or an infection before surgery. Plan to have someone take you home after the procedure. Ask your health care provider how your surgical site will be marked or identified. You may be given antibiotic medicine to help prevent infection. PROCEDURE To reduce your risk of infection: Your health care team will wash or sanitize their hands. Your skin will be washed with soap. An IV   tube may be inserted into one of your veins. You will be given a medicine to make you fall asleep (general anesthetic). A breathing tube will be placed in your mouth. The surgeon will make several small cuts (incisions) in your abdomen. A thin,  lighted tube (laparoscope) that has a tiny camera on the end will be inserted through one of the small incisions. The camera on the laparoscope will send a picture to a TV screen (monitor) in the operating room. This will give the surgeon a good view inside your abdomen. A gas will be pumped into your abdomen. This will expand your abdomen to give the surgeon more room to perform the surgery. Other tools that are needed for the procedure will be inserted through the other incisions. The gallbladder will be removed through one of the incisions. After your gallbladder has been removed, the incisions will be closed with stitches (sutures), staples, or skin glue. Your incisions may be covered with a bandage (dressing). The procedure may vary among health care providers and hospitals. AFTER THE PROCEDURE Your blood pressure, heart rate, breathing rate, and blood oxygen level will be monitored often until the medicines you were given have worn off. You will be given medicines as needed to control your pain.   This information is not intended to replace advice given to you by your health care provider. Make sure you discuss any questions you have with your health care provider.   Document Released: 09/28/2005 Document Revised: 06/19/2015 Document Reviewed: 05/10/2013 Elsevier Interactive Patient Education 2016 [employer]   Low-Fat Diet for Gallbladder Conditions A low-fat diet can be helpful if you have pancreatitis or a gallbladder condition. With these conditions, your pancreas and gallbladder have trouble digesting fats. A healthy eating plan with less fat will help rest your pancreas and gallbladder and reduce your symptoms. WHAT DO I NEED TO KNOW ABOUT THIS DIET? Eat a low-fat diet. Reduce your fat intake to less than 20-30% of your total daily calories. This is less than 50-60 g of fat per day. Remember that you need some fat in your diet. Ask your dietician what your daily goal should  be. Choose nonfat and low-fat healthy foods. Look for the words "nonfat," "low fat," or "fat free." As a guide, look on the label and choose foods with less than 3 g of fat per serving. Eat only one serving. Avoid alcohol. Do not smoke. If you need help quitting, talk with your health care provider. Eat small frequent meals instead of three large heavy meals. WHAT FOODS CAN I EAT? Grains Include healthy grains and starches such as potatoes, wheat bread, fiber-rich cereal, and brown rice. Choose whole grain options whenever possible. In adults, whole grains should account for 45-65% of your daily calories.  Fruits and Vegetables Eat plenty of fruits and vegetables. Fresh fruits and vegetables add fiber to your diet. Meats and Other Protein Sources Eat lean meat such as chicken and pork. Trim any fat off of meat before cooking it. Eggs, fish, and beans are other sources of protein. In adults, these foods should account for 10-35% of your daily calories. Dairy Choose low-fat milk and dairy options. Dairy includes fat and protein, as well as calcium.  Fats and Oils Limit high-fat foods such as fried foods, sweets, baked goods, sugary drinks.  Other Creamy sauces and condiments, such as mayonnaise, can add extra fat. Think about whether or not you need to use them, or use smaller amounts or low fat options.   WHAT FOODS ARE NOT RECOMMENDED? High fat foods, such as: Baked goods. Ice cream. French toast. Sweet rolls. Pizza. Cheese bread. Foods covered with batter, butter, creamy sauces, or cheese. Fried foods. Sugary drinks and desserts. Foods that cause gas or bloating   This information is not intended to replace advice given to you by your health care provider. Make sure you discuss any questions you have with your health care provider.   Document Released: 10/03/2013 Document Reviewed: 10/03/2013 Elsevier Interactive Patient Education 2016 Elsevier Inc.   

## 2023-04-12 NOTE — Progress Notes (Signed)
04/12/2023  History of Present Illness: Victoria Villarreal is a 27 y.o. female presenting for follow up of symptomatic cholelithiasis.  She has had multiple admissions and hospital visits for this and was originally scheduled for robotic cholecystectomy last year but her surgery was canceled due to a positive urine drug test and then she did not show up for the day of surgery on the 2nd attempt.  She was lost to follow up.  She has continued having abdominal pain.  She has made some dietary changes but she is now ready to have surgery.  Continues having RUQ abdominal discomfort after meals.  Denies any nausea/vomiting.  She has also been avoiding drug use.  Past Medical History: Past Medical History:  Diagnosis Date   Anemia    Depression    Polysubstance abuse (HCC)    a.) marijuana + cocaine   Pyelonephritis    Trichomoniasis      Past Surgical History: Past Surgical History:  Procedure Laterality Date   NO PAST SURGERIES      Home Medications: Prior to Admission medications   Medication Sig Start Date End Date Taking? Authorizing Provider  naproxen sodium (ALEVE) 220 MG tablet Take 220 mg by mouth.   Yes [provider]  ondansetron (ZOFRAN-ODT) 8 MG disintegrating tablet Take 1 tablet (8 mg total) by mouth every 8 (eight) hours as needed for nausea or vomiting. 02/05/23  Yes Bradler, Clent Jacks, MD  dicyclomine (BENTYL) 10 MG capsule Take 1 capsule (10 mg total) by mouth 4 (four) times daily -  before meals and at bedtime. Patient not taking: Reported on 04/12/2023 02/05/23   Merwyn Katos, MD    Allergies: Allergies  Allergen Reactions   Gabapentin Rash   Hydrocodone Rash   Tramadol Rash    Review of Systems: Review of Systems  Constitutional:  Negative for chills and fever.  Respiratory:  Negative for shortness of breath.   Cardiovascular:  Negative for chest pain.  Gastrointestinal:  Positive for abdominal pain and nausea. Negative for vomiting.  Genitourinary:   Negative for dysuria.    Physical Exam BP (!) 150/90   Pulse 79   Temp 98 F (36.7 C)   Ht 5\' 7"  (1.702 m)   Wt 189 lb (85.7 kg)   LMP 02/26/2023 (Exact Date)   SpO2 97%   BMI 29.60 kg/m  CONSTITUTIONAL: No acute distress, well nourished. HEENT:  Normocephalic, atraumatic, extraocular motion intact. RESPIRATORY:  Lungs are clear, and breath sounds are equal bilaterally. Normal respiratory effort without pathologic use of accessory muscles. CARDIOVASCULAR: Heart is regular without murmurs, gallops, or rubs. GI: The abdomen is soft, non-distended, with soreness in RUQ.  Negative Murphy's sign.  NEUROLOGIC:  Motor and sensation is grossly normal.  Cranial nerves are grossly intact. PSYCH:  Alert and oriented to person, place and time. Affect is normal.  Labs/Imaging: Labs from 04/02/23: Sodium 135, potassium 3.6, chloride 106, CO2 23, BUN 7, creatinine 0.63.  Total bilirubin 0.2, AST 17, ALT 16, alkaline phosphatase 52, albumin 3.7.  WBC 8.1, hemoglobin 12.8, hematocrit 41.3, platelets 340.  Ultrasound RUQ on 02/05/2023: IMPRESSION: Gallstone. No gallbladder wall thickening, pericholecystic fluid or sonographic Murphy's sign.  Assessment and Plan: This is a 27 y.o. female with symptomatic cholelithiasis.  - Discussed with the patient again the findings on her laboratory studies and imaging studies from the past.  Overall she continues having cholelithiasis and this has been continuing to cause symptoms.  The patient was not ready for surgery  she wants to schedule surgery again.  She has been abstaining from drugs. - Discussed with the patient the plan for robotic assisted cholecystectomy again and reviewed the surgery 1 more time with her including the planned incisions, risks of bleeding, infection, injury to surrounding structures, the use of ICG to better evaluate the biliary anatomy, that this would be an outpatient procedure, postoperative activity restrictions, pain control, and  she is willing to proceed. - Patient will be scheduled for surgery on 04/27/2023.  All of her questions have been answered.  I spent 30 minutes dedicated to the care of this patient on the date of this encounter to include pre-visit review of records, face-to-face time with the patient discussing diagnosis and management, and any post-visit coordination of care.   Howie Ill, MD [facility]

## 2023-04-13 ENCOUNTER — Telehealth: Payer: Self-pay | Admitting: Surgery

## 2023-04-13 NOTE — Telephone Encounter (Signed)
Unable to leave message.  If patient calls back, please inform her of the following regarding scheduled surgery with Dr. Aleen Campi.   Pre-Admission date/time, and Surgery date at [facility].  Surgery Date: 04/29/23 Preadmission Testing Date: 04/21/23 (phone 8a-1p)  Also patient will need to call at [phone], between 1-3:00pm the day before surgery, to find out what time to arrive for surgery.

## 2023-04-14 NOTE — Telephone Encounter (Signed)
Another attempt is made to contact the patient to give surgery information.  Unable to leave message, no voice mail available.

## 2023-04-20 NOTE — Telephone Encounter (Signed)
Multiple attempts have been made to reach patient, unable to leave voice message.

## 2023-04-21 ENCOUNTER — Inpatient Hospital Stay
Admission: RE | Admit: 2023-04-21 | Discharge: 2023-04-21 | Disposition: A | Discharge status: Home / Self Care | Payer: Medicaid Other | Source: Ambulatory Visit

## 2023-04-21 NOTE — Patient Instructions (Signed)
Your procedure is scheduled on: 04/29/23 - Thursday Report to the Registration Desk on the 1st floor of the [facility]. To find out your arrival time, please call [phone] between 1PM - 3PM on: 04/28/23 - Wednesday If your arrival time is 6:00 am, do not arrive before that time as the [facility] entrance doors do not open until 6:00 am.  REMEMBER: Instructions that are not followed completely may result in serious medical risk, up to and including death; or upon the discretion of your surgeon and anesthesiologist your surgery may need to be rescheduled.  Do not eat food after midnight the night before surgery.  No gum chewing or hard candies.  You may however, drink CLEAR liquids up to 2 hours before you are scheduled to arrive for your surgery. Do not drink anything within 2 hours of your scheduled arrival time.  Clear liquids include: - water  - apple juice without pulp - gatorade (not RED colors) - black coffee or tea (Do NOT add milk or creamers to the coffee or tea) Do NOT drink anything that is not on this list.   One week prior to surgery: Stop Anti-inflammatories (NSAIDS) such as Advil, Aleve, Ibuprofen, Motrin, Naproxen, Naprosyn and Aspirin based products such as Excedrin, Goody's Powder, BC Powder. You may take Tylenol if needed for pain up until the day of surgery.  Stop ANY OVER THE COUNTER supplements until after surgery.   Continue taking all prescribed medications with the exception of the following:  **Follow guidelines for insulin and diabetes medications**  Follow recommendations from Cardiologist or PCP regarding stopping blood thinners.  TAKE ONLY THESE MEDICATIONS THE MORNING OF SURGERY WITH A SIP OF WATER:  NONE   No Alcohol for 24 hours before or after surgery.  No Smoking including e-cigarettes for 24 hours before surgery.  No chewable tobacco products for at least 6 hours before surgery.  No nicotine patches on the day of surgery.  Do  not use any "recreational" drugs for at least a week (preferably 2 weeks) before your surgery.  Please be advised that the combination of cocaine and anesthesia may have negative outcomes, up to and including death. If you test positive for cocaine, your surgery will be cancelled.  On the morning of surgery brush your teeth with toothpaste and water, you may rinse your mouth with mouthwash if you wish. Do not swallow any toothpaste or mouthwash.  Use CHG Soap or wipes as directed on instruction sheet.  Do not wear jewelry, make-up, hairpins, clips or nail polish.  Do not wear lotions, powders, or perfumes.   Do not shave body hair from the neck down 48 hours before surgery.  Contact lenses, hearing aids and dentures may not be worn into surgery.  Do not bring valuables to the hospital. [facility] is not responsible for any missing/lost belongings or valuables.   Notify your doctor if there is any change in your medical condition (cold, fever, infection).  Wear comfortable clothing (specific to your surgery type) to the hospital.  After surgery, you can help prevent lung complications by doing breathing exercises.  Take deep breaths and cough every 1-2 hours. Your doctor may order a device called an Incentive Spirometer to help you take deep breaths. When coughing or sneezing, hold a pillow firmly against your incision with both hands. This is called "splinting." Doing this helps protect your incision. It also decreases belly discomfort.  If you are being admitted to the hospital overnight, leave your  suitcase in the car. After surgery it may be brought to your room.  In case of increased patient census, it may be necessary for you, the patient, to continue your postoperative care in the Same Day Surgery department.  If you are being discharged the day of surgery, you will not be allowed to drive home. You will need a responsible individual to drive you home and stay with you for 24  hours after surgery.   If you are taking public transportation, you will need to have a responsible individual with you.  Please call the Pre-admissions Testing Dept. at [phone] if you have any questions about these instructions.  Surgery Visitation Policy:  Patients having surgery or a procedure may have two visitors.  Children under the age of 83 must have an adult with them who is not the patient.  Inpatient Visitation:    Visiting hours are 7 a.m. to 8 p.m. Up to four visitors are allowed at one time in a patient room. The visitors may rotate out with other people during the day.  One visitor age 81 or older may stay with the patient overnight and must be in the room by 8 p.m.    Preparing for Surgery with CHLORHEXIDINE GLUCONATE (CHG) Soap  Chlorhexidine Gluconate (CHG) Soap  o An antiseptic cleaner that kills germs and bonds with the skin to continue killing germs even after washing  o Used for showering the night before surgery and morning of surgery  Before surgery, you can play an important role by reducing the number of germs on your skin.  CHG (Chlorhexidine gluconate) soap is an antiseptic cleanser which kills germs and bonds with the skin to continue killing germs even after washing.  Please do not use if you have an allergy to CHG or antibacterial soaps. If your skin becomes reddened/irritated stop using the CHG.  1. Shower the NIGHT BEFORE SURGERY and the MORNING OF SURGERY with CHG soap.  2. If you choose to wash your hair, wash your hair first as usual with your normal shampoo.  3. After shampooing, rinse your hair and body thoroughly to remove the shampoo.  4. Use CHG as you would any other liquid soap. You can apply CHG directly to the skin and wash gently with a scrungie or a clean washcloth.  5. Apply the CHG soap to your body only from the neck down. Do not use on open wounds or open sores. Avoid contact with your eyes, ears, mouth, and genitals  (private parts). Wash face and genitals (private parts) with your normal soap.  6. Wash thoroughly, paying special attention to the area where your surgery will be performed.  7. Thoroughly rinse your body with warm water.  8. Do not shower/wash with your normal soap after using and rinsing off the CHG soap.  9. Pat yourself dry with a clean towel.  10. Wear clean pajamas to bed the night before surgery.  12. Place clean sheets on your bed the night of your first shower and do not sleep with pets.  13. Shower again with the CHG soap on the day of surgery prior to arriving at the hospital.  14. Do not apply any deodorants/lotions/powders.  15. Please wear clean clothes to the hospital.

## 2023-04-21 NOTE — Progress Notes (Signed)
Multiple attempts to call patient to complete pre-op interview unsuccessful. Unable to leave voicemail message and mother's phone number is no longer in service. Dr. Adelene Idler office made aware.

## 2023-04-26 ENCOUNTER — Telehealth: Payer: Self-pay | Admitting: Surgery

## 2023-04-26 NOTE — Telephone Encounter (Signed)
Patient's surgery for 04/29/23 is cancelled due to non-compliance.  Numerous attempts from the office and hospital has been made and patient has yet to return any calls.  She was informed when last here that we and the hospital will be calling her.  All numbers in the chart at time of visit were verified by the patient.  To date, patient has yet to call back.  Surgery for 04/29/23 is cancelled at provider request and she is dismissed from the practice.

## 2023-04-27 ENCOUNTER — Telehealth: Payer: Self-pay | Admitting: Surgery

## 2023-04-27 NOTE — Telephone Encounter (Signed)
Patient calls back, finally.  It is explained to her that due to the non compliance on her end that surgery is cancelled.  The patient tried to blame the situation on her phone and that it was not working, she just "now got it back".  It was explained to her that not only myself but multiple people from the hospital have been trying to get in touch with her since July 2nd.  Patient was aware that at the time of visit, it was explained to her that she would start receiving calls within the next day or two regarding her surgery.  Patient did not take any responsibility on her end regarding surgery.  It was explained to her that we would have to ask before even trying to reschedule surgery or come back in for an office appointment.  Patient then hung up on me.

## 2023-04-29 ENCOUNTER — Encounter: Admission: RE | Payer: Self-pay | Source: Home / Self Care

## 2023-04-29 ENCOUNTER — Ambulatory Visit: Admission: RE | Admit: 2023-04-29 | Payer: Medicaid Other | Source: Home / Self Care | Admitting: Surgery

## 2023-04-29 SURGERY — CHOLECYSTECTOMY, ROBOT-ASSISTED, LAPAROSCOPIC
Anesthesia: General

## 2023-05-30 ENCOUNTER — Encounter: Payer: Self-pay | Admitting: Emergency Medicine

## 2023-05-30 ENCOUNTER — Emergency Department
Admission: EM | Admit: 2023-05-30 | Discharge: 2023-05-30 | Disposition: A | Discharge status: Home / Self Care | Payer: Medicaid Other | Source: Home / Self Care | Attending: Emergency Medicine | Admitting: Emergency Medicine

## 2023-05-30 ENCOUNTER — Emergency Department: Payer: Medicaid Other

## 2023-05-30 ENCOUNTER — Other Ambulatory Visit: Payer: Self-pay

## 2023-05-30 DIAGNOSIS — K2970 Gastritis, unspecified, without bleeding: Secondary | ICD-10-CM

## 2023-05-30 DIAGNOSIS — K8020 Calculus of gallbladder without cholecystitis without obstruction: Secondary | ICD-10-CM

## 2023-05-30 DIAGNOSIS — R1010 Upper abdominal pain, unspecified: Secondary | ICD-10-CM | POA: Diagnosis present

## 2023-05-30 LAB — COMPREHENSIVE METABOLIC PANEL
ALT: 14 U/L (ref 0–44)
AST: 12 U/L — ABNORMAL LOW (ref 15–41)
Albumin: 3.7 g/dL (ref 3.5–5.0)
Alkaline Phosphatase: 46 U/L (ref 38–126)
Anion gap: 10 (ref 5–15)
BUN: 6 mg/dL (ref 6–20)
CO2: 21 mmol/L — ABNORMAL LOW (ref 22–32)
Calcium: 8.4 mg/dL — ABNORMAL LOW (ref 8.9–10.3)
Chloride: 104 mmol/L (ref 98–111)
Creatinine, Ser: 0.53 mg/dL (ref 0.44–1.00)
GFR, Estimated: >60 mL/min (ref >60)
Glucose, Bld: 123 mg/dL — ABNORMAL HIGH (ref 70–99)
Potassium: 3.6 mmol/L (ref 3.5–5.1)
Sodium: 135 mmol/L (ref 135–145)
Total Bilirubin: 0.5 mg/dL (ref 0.3–1.2)
Total Protein: 7.1 g/dL (ref 6.5–8.1)

## 2023-05-30 LAB — URINALYSIS, ROUTINE W REFLEX MICROSCOPIC
Bilirubin Urine: NEGATIVE
Glucose, UA: NEGATIVE mg/dL
Hgb urine dipstick: NEGATIVE
Ketones, ur: 5 mg/dL — AB
Nitrite: NEGATIVE
Protein, ur: NEGATIVE mg/dL
Specific Gravity, Urine: 1.021 (ref 1.005–1.030)
pH: 5 (ref 5.0–8.0)

## 2023-05-30 LAB — CBC
HCT: 45.3 % (ref 36.0–46.0)
Hemoglobin: 14.4 g/dL (ref 12.0–15.0)
MCH: 28.1 pg (ref 26.0–34.0)
MCHC: 31.8 g/dL (ref 30.0–36.0)
MCV: 88.5 fL (ref 80.0–100.0)
Platelets: 345 K/uL (ref 150–400)
RBC: 5.12 MIL/uL — ABNORMAL HIGH (ref 3.87–5.11)
RDW: 14.6 % (ref 11.5–15.5)
WBC: 7.6 K/uL (ref 4.0–10.5)
nRBC: 0 % (ref 0.0–0.2)

## 2023-05-30 LAB — POC URINE PREG, ED: Preg Test, Ur: NEGATIVE

## 2023-05-30 LAB — LIPASE, BLOOD: Lipase: 24 U/L (ref 11–51)

## 2023-05-30 MED ORDER — METOCLOPRAMIDE HCL 10 MG PO TABS: 10.0000 mg | ORAL_TABLET | Freq: Four times a day (QID) | ORAL | 0 refills | Status: DC | PRN

## 2023-05-30 MED ORDER — ONDANSETRON HCL 4 MG/2ML IJ SOLN
4.0000 mg | Freq: Once | INTRAMUSCULAR | Status: AC
  Administered 2023-05-30: 4 mg via INTRAVENOUS
  Filled 2023-05-30: qty 2

## 2023-05-30 MED ORDER — FAMOTIDINE 20 MG PO TABS: 20.0000 mg | ORAL_TABLET | Freq: Two times a day (BID) | ORAL | 0 refills | Status: DC

## 2023-05-30 MED ORDER — SUCRALFATE 1 G PO TABS: 1.0000 g | ORAL_TABLET | Freq: Four times a day (QID) | ORAL | 1 refills | Status: DC

## 2023-05-30 MED ORDER — MORPHINE SULFATE (PF) 4 MG/ML IV SOLN
4.0000 mg | Freq: Once | INTRAVENOUS | Status: AC
  Administered 2023-05-30: 4 mg via INTRAVENOUS
  Filled 2023-05-30: qty 1

## 2023-05-30 MED ORDER — KETOROLAC TROMETHAMINE 15 MG/ML IJ SOLN
15.0000 mg | Freq: Once | INTRAMUSCULAR | Status: AC
  Administered 2023-05-30: 15 mg via INTRAVENOUS
  Filled 2023-05-30: qty 1

## 2023-05-30 MED ORDER — ALUM & MAG HYDROXIDE-SIMETH 200-200-20 MG/5ML PO SUSP
30.0000 mL | Freq: Once | ORAL | Status: AC
  Administered 2023-05-30: 30 mL via ORAL
  Filled 2023-05-30: qty 30

## 2023-05-30 MED ORDER — PANTOPRAZOLE SODIUM 40 MG IV SOLR
40.0000 mg | Freq: Once | INTRAVENOUS | Status: AC
  Administered 2023-05-30: 40 mg via INTRAVENOUS
  Filled 2023-05-30: qty 10

## 2023-05-30 MED ORDER — SODIUM CHLORIDE 0.9 % IV BOLUS
1000.0000 mL | Freq: Once | INTRAVENOUS | Status: AC
  Administered 2023-05-30: 1000 mL via INTRAVENOUS

## 2023-05-30 NOTE — ED Provider Notes (Signed)
[facility] Provider Note    Event Date/Time   First MD Initiated Contact with Patient 05/30/23 [phone]     (approximate)   History   Chief Complaint: Abdominal Pain   HPI  Victoria Villarreal is a 27 y.o. female with a history of cholelithiasis who comes to the ED complaining of upper abdominal pain for the past 24 hours, severe, radiating around to her back.  No fever or chills.  She reports that she is afraid to eat because it is painful and she feels more nauseated when she eats.  She reports being recommended for cholecystectomy in the past but due to changes in her cell phone service, lost contact with the surgery clinic.     Physical Exam   Triage Vital Signs: ED Triage Vitals  Encounter Vitals Group     BP 05/30/23 0815 (!) 137/92     Systolic BP Percentile --      Diastolic BP Percentile --      Pulse Rate 05/30/23 0815 83     Resp 05/30/23 0815 20     Temp 05/30/23 0815 98.2 F (36.8 C)     Temp Source 05/30/23 0815 Oral     SpO2 05/30/23 0815 100 %     Weight 05/30/23 0754 188 lb 15 oz (85.7 kg)     Height 05/30/23 0754 5\' 7"  (1.702 m)     Head Circumference --      Peak Flow --      Pain Score 05/30/23 0753 6     Pain Loc --      Pain Education --      Exclude from Growth Chart --     Most recent vital signs: Vitals:   05/30/23 1130 05/30/23 1300  BP: 125/82 122/80  Pulse: 69 68  Resp: 18 16  Temp:  98 F (36.7 C)  SpO2: 100% 100%    General: Awake, no distress.  CV:  Good peripheral perfusion.  Regular rate and rhythm Resp:  Normal effort.  Clear to auscultation bilaterally Abd:  No distention.  Soft with diffuse upper abdominal tenderness, worse in the right upper quadrant Other:  Moist oral mucosa   ED Results / Procedures / Treatments   Labs (all labs ordered are listed, but only abnormal results are displayed) Labs Reviewed  COMPREHENSIVE METABOLIC PANEL - Abnormal; Notable for the following components:      Result  Value   CO2 21 (*)    Glucose, Bld 123 (*)    Calcium 8.4 (*)    AST 12 (*)    All other components within normal limits  CBC - Abnormal; Notable for the following components:   RBC 5.12 (*)    All other components within normal limits  URINALYSIS, ROUTINE W REFLEX MICROSCOPIC - Abnormal; Notable for the following components:   Color, Urine YELLOW (*)    APPearance HAZY (*)    Ketones, ur 5 (*)    Leukocytes,Ua SMALL (*)    Bacteria, UA RARE (*)    All other components within normal limits  LIPASE, BLOOD  POC URINE PREG, ED     EKG    RADIOLOGY Ultrasound right upper quadrant interpreted by me, shows cholelithiasis without signs of cholecystitis.  Radiology report reviewed   PROCEDURES:  Procedures   MEDICATIONS ORDERED IN ED: Medications  sodium chloride 0.9 % bolus 1,000 mL (0 mLs Intravenous Stopped 05/30/23 1226)  ondansetron (ZOFRAN) injection 4 mg (4 mg Intravenous Given  05/30/23 0907)  ketorolac (TORADOL) 15 MG/ML injection 15 mg (15 mg Intravenous Given 05/30/23 0908)  morphine (PF) 4 MG/ML injection 4 mg (4 mg Intravenous Given 05/30/23 0909)  pantoprazole (PROTONIX) injection 40 mg (40 mg Intravenous Given 05/30/23 1010)  alum & mag hydroxide-simeth (MAALOX/MYLANTA) 200-200-20 MG/5ML suspension 30 mL (30 mLs Oral Given 05/30/23 1011)     IMPRESSION / MDM / ASSESSMENT AND PLAN / ED COURSE  I reviewed the triage vital signs and the nursing notes.  DDx: Cholecystitis, choledocholithiasis, pancreatitis, gastritis, pregnancy, dehydration, electrolyte abnormality  Patient's presentation is most consistent with acute presentation with potential threat to life or bodily function.  Patient presents with upper abdominal pain and tenderness in the setting of known cholelithiasis.  Vital signs are normal, she is nontoxic, labs unremarkable.  Ultrasound negative for signs of cholecystitis.  Suspect gastritis, will try  antacids.   ----------------------------------------- 1:07 PM on 05/30/2023 ----------------------------------------- Feeling better.  Pain dramatically improved by Maalox.  Tolerating oral intake.  Repeat abdominal exam is improved.  Stable for discharge      FINAL CLINICAL IMPRESSION(S) / ED DIAGNOSES   Final diagnoses:  Pain of upper abdomen  Calculus of gallbladder without cholecystitis without obstruction  Gastritis without bleeding, unspecified chronicity, unspecified gastritis type     Rx / DC Orders   ED Discharge Orders          Ordered    sucralfate (CARAFATE) 1 g tablet  4 times daily        05/30/23 1307    famotidine (PEPCID) 20 MG tablet  2 times daily        05/30/23 1307    metoCLOPramide (REGLAN) 10 MG tablet  Every 6 hours PRN        05/30/23 1307             Note:  This document was prepared using Dragon voice recognition software and may include unintentional dictation errors.   Sharman Cheek, MD 05/30/23 1537

## 2023-05-30 NOTE — ED Triage Notes (Signed)
C/O abdominal pain x 24 hours.  History of gallstones, was to have surgery July 18th, but missed her appointment. Was told she needs a referral to reschedule surgery.  Per EMS report, patient states pain is epigastric pain radiating around RUQ to back and some lower abdominal pain as well.  Pain similar to last gallbladder attack.  VS wnl.

## 2023-06-07 ENCOUNTER — Other Ambulatory Visit: Payer: Self-pay

## 2023-06-07 ENCOUNTER — Encounter: Payer: Self-pay | Admitting: Emergency Medicine

## 2023-06-07 DIAGNOSIS — Z79899 Other long term (current) drug therapy: Secondary | ICD-10-CM

## 2023-06-07 DIAGNOSIS — F17210 Nicotine dependence, cigarettes, uncomplicated: Secondary | ICD-10-CM | POA: Diagnosis present

## 2023-06-07 DIAGNOSIS — Z885 Allergy status to narcotic agent status: Secondary | ICD-10-CM

## 2023-06-07 DIAGNOSIS — K660 Peritoneal adhesions (postprocedural) (postinfection): Secondary | ICD-10-CM | POA: Diagnosis present

## 2023-06-07 DIAGNOSIS — Z1152 Encounter for screening for COVID-19: Secondary | ICD-10-CM

## 2023-06-07 DIAGNOSIS — J449 Chronic obstructive pulmonary disease, unspecified: Secondary | ICD-10-CM | POA: Diagnosis present

## 2023-06-07 DIAGNOSIS — I2510 Atherosclerotic heart disease of native coronary artery without angina pectoris: Secondary | ICD-10-CM | POA: Diagnosis present

## 2023-06-07 DIAGNOSIS — E876 Hypokalemia: Secondary | ICD-10-CM | POA: Diagnosis present

## 2023-06-07 DIAGNOSIS — D72819 Decreased white blood cell count, unspecified: Secondary | ICD-10-CM | POA: Diagnosis not present

## 2023-06-07 DIAGNOSIS — R7989 Other specified abnormal findings of blood chemistry: Secondary | ICD-10-CM | POA: Diagnosis not present

## 2023-06-07 DIAGNOSIS — Z888 Allergy status to other drugs, medicaments and biological substances status: Secondary | ICD-10-CM

## 2023-06-07 DIAGNOSIS — K8067 Calculus of gallbladder and bile duct with acute and chronic cholecystitis with obstruction: Principal | ICD-10-CM | POA: Diagnosis present

## 2023-06-07 DIAGNOSIS — E785 Hyperlipidemia, unspecified: Secondary | ICD-10-CM | POA: Diagnosis present

## 2023-06-07 NOTE — ED Triage Notes (Addendum)
EMS brings pt in from home for c/o abd/back pain that began PTA; seen recently for same and dx gallstones but st her surgeon has canceled her as a pt; pt to triage via w/c with no distress noted

## 2023-06-08 ENCOUNTER — Encounter
Admission: EM | Disposition: A | Discharge status: Home / Self Care | Payer: Self-pay | Source: Home / Self Care | Attending: Internal Medicine

## 2023-06-08 ENCOUNTER — Emergency Department: Payer: Medicaid Other

## 2023-06-08 ENCOUNTER — Encounter: Payer: Self-pay | Admitting: Internal Medicine

## 2023-06-08 ENCOUNTER — Inpatient Hospital Stay: Payer: Medicaid Other

## 2023-06-08 ENCOUNTER — Inpatient Hospital Stay: Payer: Medicaid Other | Admitting: Anesthesiology

## 2023-06-08 ENCOUNTER — Inpatient Hospital Stay
Admission: EM | Admit: 2023-06-08 | Discharge: 2023-06-09 | DRG: 419 | Disposition: A | Discharge status: Home / Self Care | Payer: Medicaid Other | Attending: Internal Medicine | Admitting: Internal Medicine

## 2023-06-08 DIAGNOSIS — Z1152 Encounter for screening for COVID-19: Secondary | ICD-10-CM | POA: Diagnosis not present

## 2023-06-08 DIAGNOSIS — K660 Peritoneal adhesions (postprocedural) (postinfection): Secondary | ICD-10-CM | POA: Diagnosis present

## 2023-06-08 DIAGNOSIS — K8043 Calculus of bile duct with acute cholecystitis with obstruction: Secondary | ICD-10-CM

## 2023-06-08 DIAGNOSIS — K8031 Calculus of bile duct with cholangitis, unspecified, with obstruction: Secondary | ICD-10-CM | POA: Insufficient documentation

## 2023-06-08 DIAGNOSIS — Z885 Allergy status to narcotic agent status: Secondary | ICD-10-CM | POA: Diagnosis not present

## 2023-06-08 DIAGNOSIS — Z79899 Other long term (current) drug therapy: Secondary | ICD-10-CM | POA: Diagnosis not present

## 2023-06-08 DIAGNOSIS — K8050 Calculus of bile duct without cholangitis or cholecystitis without obstruction: Secondary | ICD-10-CM | POA: Diagnosis not present

## 2023-06-08 DIAGNOSIS — K8067 Calculus of gallbladder and bile duct with acute and chronic cholecystitis with obstruction: Secondary | ICD-10-CM | POA: Diagnosis present

## 2023-06-08 DIAGNOSIS — R7989 Other specified abnormal findings of blood chemistry: Secondary | ICD-10-CM | POA: Diagnosis not present

## 2023-06-08 DIAGNOSIS — K810 Acute cholecystitis: Secondary | ICD-10-CM

## 2023-06-08 DIAGNOSIS — K811 Chronic cholecystitis: Secondary | ICD-10-CM

## 2023-06-08 DIAGNOSIS — J449 Chronic obstructive pulmonary disease, unspecified: Secondary | ICD-10-CM | POA: Diagnosis present

## 2023-06-08 DIAGNOSIS — R7401 Elevation of levels of liver transaminase levels: Secondary | ICD-10-CM | POA: Diagnosis present

## 2023-06-08 DIAGNOSIS — I2510 Atherosclerotic heart disease of native coronary artery without angina pectoris: Secondary | ICD-10-CM | POA: Diagnosis present

## 2023-06-08 DIAGNOSIS — E876 Hypokalemia: Secondary | ICD-10-CM | POA: Diagnosis present

## 2023-06-08 DIAGNOSIS — F17210 Nicotine dependence, cigarettes, uncomplicated: Secondary | ICD-10-CM | POA: Diagnosis present

## 2023-06-08 DIAGNOSIS — Z888 Allergy status to other drugs, medicaments and biological substances status: Secondary | ICD-10-CM | POA: Diagnosis not present

## 2023-06-08 DIAGNOSIS — E785 Hyperlipidemia, unspecified: Secondary | ICD-10-CM | POA: Diagnosis present

## 2023-06-08 DIAGNOSIS — K8020 Calculus of gallbladder without cholecystitis without obstruction: Secondary | ICD-10-CM

## 2023-06-08 DIAGNOSIS — D72819 Decreased white blood cell count, unspecified: Secondary | ICD-10-CM | POA: Diagnosis not present

## 2023-06-08 HISTORY — PX: REMOVAL OF STONES: SHX5545

## 2023-06-08 HISTORY — PX: ENDOSCOPIC RETROGRADE CHOLANGIOPANCREATOGRAPHY (ERCP) WITH PROPOFOL: SHX5810

## 2023-06-08 HISTORY — PX: SPHINCTEROTOMY: SHX5544

## 2023-06-08 LAB — COMPREHENSIVE METABOLIC PANEL
ALT: 20 U/L (ref 0–44)
AST: 36 U/L (ref 15–41)
Albumin: 3.2 g/dL — ABNORMAL LOW (ref 3.5–5.0)
Alkaline Phosphatase: 62 U/L (ref 38–126)
Anion gap: 7 (ref 5–15)
BUN: 10 mg/dL (ref 6–20)
CO2: 25 mmol/L (ref 22–32)
Calcium: 8.4 mg/dL — ABNORMAL LOW (ref 8.9–10.3)
Chloride: 107 mmol/L (ref 98–111)
Creatinine, Ser: 0.66 mg/dL (ref 0.44–1.00)
GFR, Estimated: >60 mL/min (ref >60)
Glucose, Bld: 131 mg/dL — ABNORMAL HIGH (ref 70–99)
Potassium: 3.3 mmol/L — ABNORMAL LOW (ref 3.5–5.1)
Sodium: 139 mmol/L (ref 135–145)
Total Bilirubin: 0.5 mg/dL (ref 0.3–1.2)
Total Protein: 6.3 g/dL — ABNORMAL LOW (ref 6.5–8.1)

## 2023-06-08 LAB — CBC WITH DIFFERENTIAL/PLATELET
Abs Immature Granulocytes: 0.02 10*3/uL (ref 0.00–0.07)
Basophils Absolute: 0 10*3/uL (ref 0.0–0.1)
Basophils Relative: 0 %
Eosinophils Absolute: 0.3 10*3/uL (ref 0.0–0.5)
Eosinophils Relative: 3 %
HCT: 44.1 % (ref 36.0–46.0)
Hemoglobin: 13.9 g/dL (ref 12.0–15.0)
Immature Granulocytes: 0 %
Lymphocytes Relative: 47 %
Lymphs Abs: 5.1 10*3/uL — ABNORMAL HIGH (ref 0.7–4.0)
MCH: 28.1 pg (ref 26.0–34.0)
MCHC: 31.5 g/dL (ref 30.0–36.0)
MCV: 89.1 fL (ref 80.0–100.0)
Monocytes Absolute: 0.7 10*3/uL (ref 0.1–1.0)
Monocytes Relative: 6 %
Neutro Abs: 4.9 10*3/uL (ref 1.7–7.7)
Neutrophils Relative %: 44 %
Platelets: 322 10*3/uL (ref 150–400)
RBC: 4.95 MIL/uL (ref 3.87–5.11)
RDW: 14.6 % (ref 11.5–15.5)
WBC: 11.1 10*3/uL — ABNORMAL HIGH (ref 4.0–10.5)
nRBC: 0 % (ref 0.0–0.2)

## 2023-06-08 LAB — POC URINE PREG, ED: Preg Test, Ur: NEGATIVE

## 2023-06-08 LAB — LIPASE, BLOOD: Lipase: 32 U/L (ref 11–51)

## 2023-06-08 SURGERY — ENDOSCOPIC RETROGRADE CHOLANGIOPANCREATOGRAPHY (ERCP) WITH PROPOFOL
Anesthesia: General

## 2023-06-08 MED ORDER — ENOXAPARIN SODIUM 40 MG/0.4ML IJ SOSY
40.0000 mg | PREFILLED_SYRINGE | INTRAMUSCULAR | Status: DC
  Administered 2023-06-08: 40 mg via SUBCUTANEOUS
  Filled 2023-06-08: qty 0.4

## 2023-06-08 MED ORDER — ONDANSETRON HCL 4 MG PO TABS: 4.0000 mg | ORAL_TABLET | Freq: Four times a day (QID) | ORAL | Status: DC | PRN

## 2023-06-08 MED ORDER — LACTATED RINGERS IV SOLN: INTRAVENOUS | Status: DC

## 2023-06-08 MED ORDER — GLYCOPYRROLATE 0.2 MG/ML IJ SOLN
INTRAMUSCULAR | Status: AC
  Filled 2023-06-08: qty 1

## 2023-06-08 MED ORDER — PROPOFOL 10 MG/ML IV BOLUS
INTRAVENOUS | Status: AC
  Filled 2023-06-08: qty 40

## 2023-06-08 MED ORDER — INDOCYANINE GREEN 25 MG IV SOLR: 2.5000 mg | Freq: Once | INTRAVENOUS | Status: DC

## 2023-06-08 MED ORDER — BISACODYL 5 MG PO TBEC: 5.0000 mg | DELAYED_RELEASE_TABLET | Freq: Every day | ORAL | Status: DC | PRN

## 2023-06-08 MED ORDER — FAMOTIDINE 20 MG PO TABS
20.0000 mg | ORAL_TABLET | Freq: Two times a day (BID) | ORAL | Status: DC
  Administered 2023-06-08: 20 mg via ORAL
  Filled 2023-06-08 (×2): qty 1

## 2023-06-08 MED ORDER — HYDROMORPHONE HCL 1 MG/ML IJ SOLN
0.5000 mg | INTRAMUSCULAR | Status: DC | PRN
  Administered 2023-06-08 – 2023-06-09 (×4): 1 mg via INTRAVENOUS
  Filled 2023-06-08 (×4): qty 1

## 2023-06-08 MED ORDER — ONDANSETRON HCL 4 MG/2ML IJ SOLN
INTRAMUSCULAR | Status: AC
  Filled 2023-06-08: qty 2

## 2023-06-08 MED ORDER — ONDANSETRON HCL 4 MG/2ML IJ SOLN: 4.0000 mg | Freq: Four times a day (QID) | INTRAMUSCULAR | Status: DC | PRN

## 2023-06-08 MED ORDER — GLYCOPYRROLATE 0.2 MG/ML IJ SOLN
INTRAMUSCULAR | Status: DC | PRN
Start: 2023-06-08 — End: 2023-06-08
  Administered 2023-06-08: .2 mg via INTRAVENOUS

## 2023-06-08 MED ORDER — PROPOFOL 10 MG/ML IV BOLUS
INTRAVENOUS | Status: DC | PRN
  Administered 2023-06-08: 100 mg via INTRAVENOUS
  Administered 2023-06-08 (×2): 30 mg via INTRAVENOUS

## 2023-06-08 MED ORDER — DICLOFENAC SUPPOSITORY 100 MG: 100.0000 mg | Freq: Once | RECTAL | Status: DC

## 2023-06-08 MED ORDER — MORPHINE SULFATE (PF) 4 MG/ML IV SOLN
4.0000 mg | Freq: Once | INTRAVENOUS | Status: AC
  Administered 2023-06-08: 4 mg via INTRAVENOUS
  Filled 2023-06-08: qty 1

## 2023-06-08 MED ORDER — DEXMEDETOMIDINE HCL IN NACL 80 MCG/20ML IV SOLN
INTRAVENOUS | Status: DC | PRN
  Administered 2023-06-08: 8 ug via INTRAVENOUS

## 2023-06-08 MED ORDER — LIDOCAINE VISCOUS HCL 2 % MT SOLN
15.0000 mL | Freq: Once | OROMUCOSAL | Status: AC
  Administered 2023-06-08: 15 mL via ORAL
  Filled 2023-06-08: qty 15

## 2023-06-08 MED ORDER — FENTANYL CITRATE PF 50 MCG/ML IJ SOSY
50.0000 ug | PREFILLED_SYRINGE | INTRAMUSCULAR | Status: DC | PRN
  Administered 2023-06-08: 50 ug via INTRAVENOUS
  Filled 2023-06-08: qty 1

## 2023-06-08 MED ORDER — LACTATED RINGERS IV SOLN
INTRAVENOUS | Status: DC
  Administered 2023-06-08: 1000 mL via INTRAVENOUS

## 2023-06-08 MED ORDER — GADOBUTROL 1 MMOL/ML IV SOLN
7.5000 mL | Freq: Once | INTRAVENOUS | Status: AC | PRN
  Administered 2023-06-08: 7.5 mL via INTRAVENOUS

## 2023-06-08 MED ORDER — SODIUM CHLORIDE 0.9 % IV SOLN
INTRAVENOUS | Status: AC
  Administered 2023-06-09: 1000 mL via INTRAVENOUS

## 2023-06-08 MED ORDER — DICLOFENAC SUPPOSITORY 100 MG
RECTAL | Status: AC
  Filled 2023-06-08: qty 1

## 2023-06-08 MED ORDER — LIDOCAINE HCL (CARDIAC) PF 100 MG/5ML IV SOSY
PREFILLED_SYRINGE | INTRAVENOUS | Status: DC | PRN
  Administered 2023-06-08: 80 mg via INTRAVENOUS

## 2023-06-08 MED ORDER — PROPOFOL 500 MG/50ML IV EMUL
INTRAVENOUS | Status: DC | PRN
  Administered 2023-06-08: 150 ug/kg/min via INTRAVENOUS

## 2023-06-08 MED ORDER — ONDANSETRON HCL 4 MG/2ML IJ SOLN
4.0000 mg | Freq: Four times a day (QID) | INTRAMUSCULAR | Status: DC | PRN
  Administered 2023-06-09: 4 mg via INTRAVENOUS
  Filled 2023-06-08: qty 2

## 2023-06-08 MED ORDER — ONDANSETRON HCL 4 MG PO TABS: 4.0000 mg | ORAL_TABLET | Freq: Every day | ORAL | 1 refills | Status: AC | PRN

## 2023-06-08 MED ORDER — PIPERACILLIN-TAZOBACTAM 3.375 G IVPB
3.3750 g | Freq: Three times a day (TID) | INTRAVENOUS | Status: DC
  Administered 2023-06-08 – 2023-06-09 (×2): 3.375 g via INTRAVENOUS
  Filled 2023-06-08 (×2): qty 50

## 2023-06-08 MED ORDER — POTASSIUM CHLORIDE 10 MEQ/100ML IV SOLN
10.0000 meq | INTRAVENOUS | Status: AC
  Administered 2023-06-08 (×2): 10 meq via INTRAVENOUS
  Filled 2023-06-08 (×2): qty 100

## 2023-06-08 MED ORDER — ALUM & MAG HYDROXIDE-SIMETH 200-200-20 MG/5ML PO SUSP
30.0000 mL | Freq: Once | ORAL | Status: AC
  Administered 2023-06-08: 30 mL via ORAL
  Filled 2023-06-08: qty 30

## 2023-06-08 MED ORDER — ONDANSETRON HCL 4 MG/2ML IJ SOLN
4.0000 mg | Freq: Once | INTRAMUSCULAR | Status: AC
  Administered 2023-06-08: 4 mg via INTRAVENOUS
  Filled 2023-06-08: qty 2

## 2023-06-08 MED ORDER — PIPERACILLIN-TAZOBACTAM 3.375 G IVPB 30 MIN
3.3750 g | Freq: Once | INTRAVENOUS | Status: AC
  Administered 2023-06-08: 3.375 g via INTRAVENOUS
  Filled 2023-06-08: qty 50

## 2023-06-08 MED ORDER — LIDOCAINE HCL (PF) 2 % IJ SOLN
INTRAMUSCULAR | Status: AC
  Filled 2023-06-08: qty 5

## 2023-06-08 MED ORDER — PANTOPRAZOLE SODIUM 40 MG IV SOLR
40.0000 mg | Freq: Once | INTRAVENOUS | Status: AC
  Administered 2023-06-08: 40 mg via INTRAVENOUS
  Filled 2023-06-08: qty 10

## 2023-06-08 MED ORDER — FENTANYL CITRATE (PF) 100 MCG/2ML IJ SOLN
INTRAMUSCULAR | Status: AC
  Filled 2023-06-08: qty 2

## 2023-06-08 MED ORDER — SUCRALFATE 1 G PO TABS
1.0000 g | ORAL_TABLET | Freq: Four times a day (QID) | ORAL | Status: DC
  Administered 2023-06-08 (×2): 1 g via ORAL
  Filled 2023-06-08 (×3): qty 1

## 2023-06-08 MED ORDER — KETOROLAC TROMETHAMINE 15 MG/ML IJ SOLN
15.0000 mg | Freq: Once | INTRAMUSCULAR | Status: AC
  Administered 2023-06-08: 15 mg via INTRAVENOUS
  Filled 2023-06-08: qty 1

## 2023-06-08 MED ORDER — LORAZEPAM 2 MG/ML IJ SOLN: 1.0000 mg | Freq: Once | INTRAMUSCULAR | Status: DC | PRN

## 2023-06-08 MED ORDER — FENTANYL CITRATE (PF) 100 MCG/2ML IJ SOLN
INTRAMUSCULAR | Status: DC | PRN
  Administered 2023-06-08 (×2): 50 ug via INTRAVENOUS

## 2023-06-08 MED ORDER — SENNOSIDES-DOCUSATE SODIUM 8.6-50 MG PO TABS: 1.0000 | ORAL_TABLET | Freq: Every evening | ORAL | Status: DC | PRN

## 2023-06-08 MED ORDER — CHLORHEXIDINE GLUCONATE CLOTH 2 % EX PADS: 6.0000 | MEDICATED_PAD | Freq: Every day | CUTANEOUS | Status: DC

## 2023-06-08 MED ORDER — ONDANSETRON HCL 4 MG/2ML IJ SOLN
INTRAMUSCULAR | Status: DC | PRN
  Administered 2023-06-08: 4 mg via INTRAVENOUS

## 2023-06-08 NOTE — Transfer of Care (Signed)
Immediate Anesthesia Transfer of Care Note  Patient: Victoria Villarreal  Procedure(s) Performed: ENDOSCOPIC RETROGRADE CHOLANGIOPANCREATOGRAPHY (ERCP) WITH PROPOFOL  Patient Location: PACU  Anesthesia Type:MAC  Level of Consciousness: awake and patient cooperative  Airway & Oxygen Therapy: Patient Spontanous Breathing and Patient connected to nasal cannula oxygen  Post-op Assessment: Report given to RN and Post -op Vital signs reviewed and stable  Post vital signs: Reviewed and stable  Last Vitals:  Vitals Value Taken Time  BP 136/84 06/08/23 1444  Temp    Pulse 74 06/08/23 1445  Resp 15 06/08/23 1445  SpO2 100 % 06/08/23 1445  Vitals shown include unfiled device data.  Last Pain:  Vitals:   06/08/23 1302  TempSrc: Temporal  PainSc:          Complications: No notable events documented.

## 2023-06-08 NOTE — Op Note (Signed)
[facility] Gastroenterology Patient Name: Victoria Villarreal Procedure Date: 06/08/2023 1:34 PM MRN: [id number] Account #: [id number] Date of Birth: [birth year] Admit Type: Outpatient Age: 27 Room: [facility] ENDO ROOM 4 Gender: Female Note Status: Finalized Instrument Name: Nolon Stalls [id number] Procedure:             ERCP Indications:           Common bile duct stone(s) Providers:             [name] MD, MD Referring MD:          No Local Md, MD (Referring MD) Medicines:             Propofol per Anesthesia Complications:         No immediate complications. Procedure:             Pre-Anesthesia Assessment:                        - Prior to the procedure, a History and Physical was                         performed, and patient medications and allergies were                         reviewed. The patient's tolerance of previous                         anesthesia was also reviewed. The risks and benefits                         of the procedure and the sedation options and risks                         were discussed with the patient. All questions were                         answered, and informed consent was obtained. Prior                         Anticoagulants: The patient has taken no anticoagulant                         or antiplatelet agents. ASA Grade Assessment: II - A                         patient with mild systemic disease. After reviewing                         the risks and benefits, the patient was deemed in                         satisfactory condition to undergo the procedure.                        After obtaining informed consent, the scope was passed                         under direct vision. Throughout the procedure, the  patient's blood pressure, pulse, and oxygen                         saturations were monitored continuously. The                         Duodenoscope was introduced through the mouth, and                          used to inject contrast into and used to inject                         contrast into the bile duct. The ERCP was accomplished                         without difficulty. The patient tolerated the                         procedure well. Findings:      The scout film was normal. The esophagus was successfully intubated       under direct vision. The scope was advanced to a normal major papilla in       the descending duodenum without detailed examination of the pharynx,       larynx and associated structures, and upper GI tract. The upper GI tract       was grossly normal. The bile duct was deeply cannulated with the       short-nosed traction sphincterotome. Contrast was injected. I personally       interpreted the bile duct images. There was brisk flow of contrast       through the ducts. Image quality was excellent. A wire was passed into       the biliary tree. A 7 mm biliary sphincterotomy was made with a traction       (standard) sphincterotome using ERBE electrocautery. There was no       post-sphincterotomy bleeding. The biliary tree was swept with an 18 mm       balloon starting at the bifurcation. One stone was removed. No stones       remained. Impression:            - Choledocholithiasis was found. Complete removal was                         accomplished by biliary sphincterotomy and balloon                         extraction.                        - A biliary sphincterotomy was performed.                        - The biliary tree was swept. Recommendation:        - Return patient to hospital ward for ongoing care.                        - Clear liquid diet. Procedure Code(s):     --- Professional ---  [phone], Endoscopic retrograde cholangiopancreatography                         (ERCP); with removal of calculi/debris from                         biliary/pancreatic duct(s)                        [id number], Endoscopic retrograde cholangiopancreatography                          (ERCP); with sphincterotomy/papillotomy                        [phone], Endoscopic catheterization of the biliary                         ductal system, radiological supervision and                         interpretation Diagnosis Code(s):     --- Professional ---                        K80.50, Calculus of bile duct without cholangitis or                         cholecystitis without obstruction CPT copyright 2022 [employer]. All rights reserved. The codes documented in this report are preliminary and upon coder review may  be revised to meet current compliance requirements. [name] MD, MD 06/08/2023 2:49:30 PM This report has been signed electronically. Number of Addenda: 0 Note Initiated On: 06/08/2023 1:34 PM Estimated Blood Loss:  Estimated blood loss: none.      [facility]

## 2023-06-08 NOTE — H&P (Signed)
History and Physical    Victoria Villarreal ZOX:[id number] DOB: October 10, 1996 DOA: 06/08/2023  PCP: [facility] (Confirm with patient/family/NH records and if not entered, this has to be entered at [facility] point of entry) Patient coming from: Home  I have personally briefly reviewed patient's old medical records in [facility]  Chief Complaint: Abdominal pain,  HPI: Victoria Villarreal is a 27 y.o. female with medical history significant of cholelithiasis, presented with worsening of abdominal pain.  Patient was first diagnosed with cholelithiasis last year during her pregnancy, incidentally by abdominal ultrasound she was asymptomatic at that point.  This year, about 4-5 months ago she started to have intermittent RUQ abdominal pain and has had several ED visit.  She was diagnosed with symptomatic gallstones and referred to see general surgeon.  Her last ED visit was 1 week ago, when she was stabilized and discharged home.  She was given the follow-up information however she said due to technical problem of her cell phone she has not follow-up and missed the outpatient elective surgery.  Last night she started to have a severe 10/10 cramping-like RUQ abdominal pain radiating to the back feeling nausea but no vomiting denies any fever or chills.  ED Course: Afebrile, no tachycardia no hypotension.  WBC 11.1, K3.3 AST ALT within normal limits bilirubin<1.0.  MRCP showed cholelithiasis with gallbladder wall thickening and enhancement concerning for acute cholecystitis and abrupt termination of distal CBD with a possible 3 mm impact gallstone.  Patient was started on Zosyn and GI was consulted who planned for emergency ERCP.  Review of Systems: As per HPI otherwise 14 point review of systems negative.    Past Medical History:  Diagnosis Date   Anemia    Depression    Polysubstance abuse (HCC)    a.) marijuana + cocaine   Pyelonephritis    Trichomoniasis     Past Surgical History:   Procedure Laterality Date   NO PAST SURGERIES       reports that she has been smoking cigarettes. She has been exposed to tobacco smoke. She has never used smokeless tobacco. She reports that she does not currently use alcohol after a past usage of about 1.0 standard drink of alcohol per week. She reports current drug use. Drug: Marijuana.  Allergies  Allergen Reactions   Gabapentin Rash   Hydrocodone Rash   Tramadol Rash    History reviewed. No pertinent family history.   Prior to Admission medications   Medication Sig Start Date End Date Taking? Authorizing Provider  ondansetron (ZOFRAN) 4 MG tablet Take 1 tablet (4 mg total) by mouth daily as needed. 06/08/23 06/07/24 Yes Pilar Jarvis, MD  dicyclomine (BENTYL) 10 MG capsule Take 1 capsule (10 mg total) by mouth 4 (four) times daily -  before meals and at bedtime. Patient not taking: Reported on 04/12/2023 02/05/23   Merwyn Katos, MD  famotidine (PEPCID) 20 MG tablet Take 1 tablet (20 mg total) by mouth 2 (two) times daily. 05/30/23   Sharman Cheek, MD  metoCLOPramide (REGLAN) 10 MG tablet Take 1 tablet (10 mg total) by mouth every 6 (six) hours as needed. 05/30/23   Sharman Cheek, MD  naproxen sodium (ALEVE) 220 MG tablet Take 220 mg by mouth.    [provider]  sucralfate (CARAFATE) 1 g tablet Take 1 tablet (1 g total) by mouth 4 (four) times daily. 05/30/23   Sharman Cheek, MD    Physical Exam: Vitals:   06/07/23 2347 06/07/23  2353 06/08/23 0500 06/08/23 0815  BP: 130/87  112/71 124/72  Pulse: 84  (!) 57 87  Resp: 18   17  Temp: 97.7 F (36.5 C)     TempSrc: Oral     SpO2: 99%  97% 95%  Weight:  86.2 kg    Height:  5\' 7"  (1.702 m)      Constitutional: NAD, calm, comfortable Vitals:   06/07/23 2347 06/07/23 2353 06/08/23 0500 06/08/23 0815  BP: 130/87  112/71 124/72  Pulse: 84  (!) 57 87  Resp: 18   17  Temp: 97.7 F (36.5 C)     TempSrc: Oral     SpO2: 99%  97% 95%  Weight:  86.2 kg     Height:  5\' 7"  (1.702 m)     Eyes: PERRL, lids and conjunctivae normal ENMT: Mucous membranes are moist. Posterior pharynx clear of any exudate or lesions.Normal dentition.  Neck: normal, supple, no masses, no thyromegaly Respiratory: clear to auscultation bilaterally, no wheezing, no crackles. Normal respiratory effort. No accessory muscle use.  Cardiovascular: Regular rate and rhythm, no murmurs / rubs / gallops. No extremity edema. 2+ pedal pulses. No carotid bruits.  Abdomen: tenderness on RUQ, no rebound, no guarding, no masses palpated. No hepatosplenomegaly. Bowel sounds positive.  Musculoskeletal: no clubbing / cyanosis. No joint deformity upper and lower extremities. Good ROM, no contractures. Normal muscle tone.  Skin: no rashes, lesions, ulcers. No induration Neurologic: CN 2-12 grossly intact. Sensation intact, DTR normal. Strength 5/5 in all 4.  Psychiatric: Normal judgment and insight. Alert and oriented x 3. Normal mood.     Labs on Admission: I have personally reviewed following labs and imaging studies  CBC: Recent Labs  Lab 06/08/23 0000  WBC 11.1*  NEUTROABS 4.9  HGB 13.9  HCT 44.1  MCV 89.1  PLT 322   Basic Metabolic Panel: Recent Labs  Lab 06/08/23 0000  NA 139  K 3.3*  CL 107  CO2 25  GLUCOSE 131*  BUN 10  CREATININE 0.66  CALCIUM 8.4*   GFR: Estimated Creatinine Clearance: 119.1 mL/min (by C-G formula based on SCr of 0.66 mg/dL). Liver Function Tests: Recent Labs  Lab 06/08/23 0000  AST 36  ALT 20  ALKPHOS 62  BILITOT 0.5  PROT 6.3*  ALBUMIN 3.2*   Recent Labs  Lab 06/08/23 0000  LIPASE 32   No results for input(s): "AMMONIA" in the last 168 hours. Coagulation Profile: No results for input(s): "INR", "PROTIME" in the last 168 hours. Cardiac Enzymes: No results for input(s): "CKTOTAL", "CKMB", "CKMBINDEX", "TROPONINI" in the last 168 hours. BNP (last 3 results) No results for input(s): "PROBNP" in the last 8760  hours. HbA1C: No results for input(s): "HGBA1C" in the last 72 hours. CBG: No results for input(s): "GLUCAP" in the last 168 hours. Lipid Profile: No results for input(s): "CHOL", "HDL", "LDLCALC", "TRIG", "CHOLHDL", "LDLDIRECT" in the last 72 hours. Thyroid Function Tests: No results for input(s): "TSH", "T4TOTAL", "FREET4", "T3FREE", "THYROIDAB" in the last 72 hours. Anemia Panel: No results for input(s): "VITAMINB12", "FOLATE", "FERRITIN", "TIBC", "IRON", "RETICCTPCT" in the last 72 hours. Urine analysis:    Component Value Date/Time   COLORURINE YELLOW (A) 05/30/2023 0818   APPEARANCEUR HAZY (A) 05/30/2023 0818   APPEARANCEUR Turbid 06/09/2014 1114   LABSPEC 1.021 05/30/2023 0818   LABSPEC 1.014 06/09/2014 1114   PHURINE 5.0 05/30/2023 0818   GLUCOSEU NEGATIVE 05/30/2023 0818   GLUCOSEU Negative 06/09/2014 1114   HGBUR NEGATIVE 05/30/2023 0818  BILIRUBINUR NEGATIVE 05/30/2023 0818   BILIRUBINUR Negative 06/09/2014 1114   KETONESUR 5 (A) 05/30/2023 0818   PROTEINUR NEGATIVE 05/30/2023 0818   NITRITE NEGATIVE 05/30/2023 0818   LEUKOCYTESUR SMALL (A) 05/30/2023 0818   LEUKOCYTESUR 3+ 06/09/2014 1114    Radiological Exams on Admission: MR 3D Recon At Scanner  Result Date: 06/08/2023 : CLINICAL DATA: Right upper quadrant pain. Biliary disease suspected. EXAM: MRI ABDOMEN WITHOUT AND WITH CONTRAST (INCLUDING MRCP) TECHNIQUE: Multiplanar multisequence MR imaging of the abdomen was performed both before and after the administration of intravenous contrast. Heavily T2-weighted images of the biliary and pancreatic ducts were obtained, and three-dimensional MRCP images were rendered by post processing. CONTRAST: 7.36mL GADAVIST GADOBUTROL 1 MMOL/ML IV SOLN COMPARISON: MRI 06/21/2022 FINDINGS: Lower chest: No acute abnormality. Hepatobiliary: No suspicious enhancing liver lesions. Sludge noted within the dependent portion of the gallbladder. Gallbladder wall the largest stone measures 7 mm  as noted on the ultrasound from earlier today. Gallbladder wall enhancement with thickening noted measuring up to 5 mm. Mild intrahepatic bile duct dilatation. Common bile duct is increased in caliber measuring 8 mm. Abrupt termination of the distal common bile duct is noted where a possible 3 mm impacted stone is noted, image 12/21. No other signs of choledocholithiasis identified. Pancreas: No mass, inflammatory changes, or other parenchymal abnormality identified. Spleen: Within normal limits in size and appearance. Adrenals/Urinary Tract: Normal adrenal glands. No kidney mass, hydronephrosis or nephrolithiasis. Stomach/Bowel: Visualized portions within the abdomen are unremarkable. Vascular/Lymphatic: No pathologically enlarged lymph nodes identified. No abdominal aortic aneurysm demonstrated. Other: No ascites or focal fluid collections identified. Musculoskeletal: No suspicious bone lesions identified. IMPRESSION: 1. Cholelithiasis with gallbladder wall thickening and enhancement. Findings are concerning for acute cholecystitis. 2. Mild intrahepatic and extrahepatic bile duct dilatation. Abrupt termination of the distal common bile duct is noted where a possible 3 mm impacted stone is noted. No other signs of choledocholithiasis identified. Electronically Signed   By: Signa Kell M.D.   On: 06/08/2023 08:15   MR ABDOMEN MRCP W WO CONTAST  Result Date: 06/08/2023 CLINICAL DATA:  Right upper quadrant pain. Biliary disease suspected. EXAM: MRI ABDOMEN WITHOUT AND WITH CONTRAST (INCLUDING MRCP) TECHNIQUE: Multiplanar multisequence MR imaging of the abdomen was performed both before and after the administration of intravenous contrast. Heavily T2-weighted images of the biliary and pancreatic ducts were obtained, and three-dimensional MRCP images were rendered by post processing. CONTRAST:  7.45mL GADAVIST GADOBUTROL 1 MMOL/ML IV SOLN COMPARISON:  MRI 06/21/2022 FINDINGS: Lower chest: No acute abnormality.  Hepatobiliary: No suspicious enhancing liver lesions. Sludge noted within the dependent portion of the gallbladder. Gallbladder wall the largest stone measures 7 mm as noted on the ultrasound from earlier today. Gallbladder wall enhancement with thickening noted measuring up to 5 mm. Mild intrahepatic bile duct dilatation. Common bile duct is increased in caliber measuring 8 mm. Abrupt termination of the distal common bile duct is noted where a possible 3 mm impacted stone is noted, image 12/21. No other signs of choledocholithiasis identified. Pancreas: No mass, inflammatory changes, or other parenchymal abnormality identified. Spleen:  Within normal limits in size and appearance. Adrenals/Urinary Tract: Normal adrenal glands. No kidney mass, hydronephrosis or nephrolithiasis. Stomach/Bowel: Visualized portions within the abdomen are unremarkable. Vascular/Lymphatic: No pathologically enlarged lymph nodes identified. No abdominal aortic aneurysm demonstrated. Other:  No ascites or focal fluid collections identified. Musculoskeletal: No suspicious bone lesions identified. IMPRESSION: 1. Cholelithiasis with gallbladder wall thickening and enhancement. Findings are concerning for acute cholecystitis. 2. Mild intrahepatic  and extrahepatic bile duct dilatation. Abrupt termination of the distal common bile duct is noted where a possible 3 mm impacted stone is noted. No other signs of choledocholithiasis identified. Electronically Signed   By: Signa Kell M.D.   On: 06/08/2023 07:34   US ABDOMEN LIMITED RUQ (LIVER/GB)  Result Date: 06/08/2023 CLINICAL DATA:  Right upper quadrant pain EXAM: ULTRASOUND ABDOMEN LIMITED RIGHT UPPER QUADRANT COMPARISON:  05/30/2023 FINDINGS: Gallbladder: Gallbladder is well distended with sludge and gallstones. Mild wall thickening at 3.9 mm is noted. Common bile duct: Diameter: 7 mm. Echogenicity is noted in the distal common bile duct with shadowing which may represent  choledocholithiasis. Liver: No focal lesion identified. Within normal limits in parenchymal echogenicity. Portal vein is patent on color Doppler imaging with normal direction of blood flow towards the liver. Other: None. IMPRESSION: Cholelithiasis with gallbladder sludge. Negative sonographic Murphy's sign is elicited all though the patient is medicated. Increase in the common bile duct diameter to 7 mm from 3.6 mm on the most recent exam. There are findings suggestive of distal choledocholithiasis. Electronically Signed   By: Alcide Clever M.D.   On: 06/08/2023 03:32    EKG: None  Assessment/Plan Principal Problem:   Acute cholecystitis Active Problems:   Cholangitis due to bile duct calculus with obstruction  (please populate well all problems here in Problem List. (For example, if patient is on BP meds at home and you resume or decide to hold them, it is a problem that needs to be her. Same for CAD, COPD, HLD and so on)  Acute cholecystitis -Discussed with on-call surgeon Dr. Everlene Farrier, plan for surgery tomorrow -Continue Zosyn -Trend LFTs  Acute cholelithiasis -3 mm CBD stone identified on MRCP.  Normal LFTs as of now.  GI Dr. Servando Snare plans for ERCP this morning, continue n.p.o. and IV fluid and pain control. -Trend LFTs  Hypokalemia -IV replacement, repeat level tomorrow   DVT prophylaxis: Lovenox Code Status: Full code Family Communication: Sister at bedside Disposition Plan: Patient is sick with acute cholecystitis with gallstone and concurrent CBD obstruction requiring inpatient GI and inpatient surgical intervention, expect more than 2 midnight hospital stay Consults called: GI and general surgery Admission status: MedSurg admission   Emeline General MD [facility] Pager [phone]  06/08/2023, 10:18 AM

## 2023-06-08 NOTE — ED Provider Notes (Signed)
Emergency department handoff note  Care of this patient was signed out to me at the end of the previous provider shift.  All pertinent patient information was conveyed and all questions were answered.  Patient pending MRCP which shows evidence of cholecystitis with likely common bile duct stone.  I spoke to on-call gastroenterologist, Dr. Servando Snare, who agrees with plan for admission with ERCP for symptomatic resolution.  I discussed plan with patient who agrees and all questions were answered.  Dispo: Admit to medicine   Merwyn Katos, MD 06/08/23 [phone]

## 2023-06-08 NOTE — ED Notes (Signed)
Pt unable to provide a urine sample at this time. Pt states, "I feel like I would fall if I try to stand up." Pt informed not to stand without assistance so that she does not fall.

## 2023-06-08 NOTE — Discharge Instructions (Addendum)
In addition to included general post-operative instructions,  Diet: Resume home diet. Recommend avoiding or limiting fatty/greasy foods over the next few days/week. If you do eat these, you may (or may not) notice diarrhea. This is expected while your body adjusts to not having a gallbladder, and it typically resolves with time.    Activity: No heavy lifting >20 pounds (children, pets, laundry, garbage) or strenuous activity for 4 weeks, but light activity and walking are encouraged. Do not drive or drink alcohol if taking narcotic pain medications or having pain that might distract from driving.  Wound care: 2 days after surgery (08/30), you may shower/get incision wet with soapy water and pat dry (do not rub incisions), but no baths or submerging incision underwater until follow-up.   Medications: Resume all home medications. For mild to moderate pain: acetaminophen (Tylenol) or ibuprofen/naproxen (if no kidney disease). Combining Tylenol with alcohol can substantially increase your risk of causing liver disease. Narcotic pain medications, if prescribed, can be used for severe pain, though may cause nausea, constipation, and drowsiness. Do not combine Tylenol and Percocet (or similar) within a 6 hour period as Percocet (and similar) contain(s) Tylenol. If you do not need the narcotic pain medication, you do not need to fill the prescription.  Call office [phone] / [phone]) at any time if any questions, worsening pain, fevers/chills, bleeding, drainage from incision site, or other concerns.

## 2023-06-08 NOTE — Consult Note (Signed)
Patient ID: Victoria Villarreal, female   DOB: 1996-02-07, 27 y.o.   MRN: [id number]  HPI Victoria Villarreal is a 27 y.o. female seen in consultation at the request of Dr. Chipper Herb.  She presented to the emergency room complaining of abdominal pain, patient reports the pain is moderate to severe and radiated to the back, it is intermittent and dull She also endorses some nausea vomiting and decreased appetite.  Denies any fevers or chills.  Of note she saw Dr. Kerby Less last year and she canceled her surgery multiple times.  She has not been the most reliable patient.  Ultrasound and MRCP personally reviewed showing evidence of cholelithiasis with increased diameter of the CBD up to 7 mm and there is evidence of choledocholithiasis. CMP is completely normal.  Lipase and CBC is normal. She is Able to perform more than 4 METS w/o SOB or C/P HPI  Past Medical History:  Diagnosis Date   Anemia    Depression    Polysubstance abuse (HCC)    a.) marijuana + cocaine   Pyelonephritis    Trichomoniasis     Past Surgical History:  Procedure Laterality Date   NO PAST SURGERIES      History reviewed. No pertinent family history.  Social History Social History   Tobacco Use   Smoking status: Every Day    Current packs/day: 0.25    Types: Cigarettes    Passive exposure: Past   Smokeless tobacco: Never  Vaping Use   Vaping status: Never Used  Substance Use Topics   Alcohol use: Not Currently    Alcohol/week: 1.0 standard drink of alcohol    Types: 1 Glasses of wine per week    Comment: intermittently   Drug use: Yes    Types: Marijuana    Allergies  Allergen Reactions   Gabapentin Rash   Hydrocodone Rash   Tramadol Rash    Current Facility-Administered Medications  Medication Dose Route Frequency Provider Last Rate Last Admin   0.9 %  sodium chloride infusion   Intravenous Continuous Emeline General, MD 125 mL/hr at 06/08/23 1119 New Bag at 06/08/23 1119   bisacodyl (DULCOLAX) EC tablet 5 mg   5 mg Oral Daily PRN Emeline General, MD       [START ON 06/09/2023] Chlorhexidine Gluconate Cloth 2 % PADS 6 each  6 each Topical Q0600 Araceli Coufal F, MD       enoxaparin (LOVENOX) injection 40 mg  40 mg Subcutaneous Q24H Mikey College T, MD       famotidine (PEPCID) tablet 20 mg  20 mg Oral BID Mikey College T, MD       fentaNYL (SUBLIMAZE) injection 50 mcg  50 mcg Intravenous Q2H PRN Merwyn Katos, MD   50 mcg at 06/08/23 0820   HYDROmorphone (DILAUDID) injection 0.5-1 mg  0.5-1 mg Intravenous Q2H PRN Emeline General, MD       [START ON 06/09/2023] indocyanine green (IC-GREEN) injection 2.5 mg  2.5 mg Intravenous Once Graylin Sperling F, MD       LORazepam (ATIVAN) injection 1 mg  1 mg Intravenous Once PRN Pilar Jarvis, MD       ondansetron [facility]) injection 4 mg  4 mg Intravenous Q6H PRN Mikey College T, MD       piperacillin-tazobactam (ZOSYN) IVPB 3.375 g  3.375 g Intravenous Q8H Rockwell Alexandria, RPH       potassium chloride 10 mEq in 100 mL IVPB  10 mEq Intravenous Q1 Hr  x 2 Mikey College T, MD 100 mL/hr at 06/08/23 1227 10 mEq at 06/08/23 1227   senna-docusate (Senokot-S) tablet 1 tablet  1 tablet Oral QHS PRN Emeline General, MD       sucralfate (CARAFATE) tablet 1 g  1 g Oral QID Emeline General, MD       Current Outpatient Medications  Medication Sig Dispense Refill   ondansetron (ZOFRAN) 4 MG tablet Take 1 tablet (4 mg total) by mouth daily as needed. 30 tablet 1   dicyclomine (BENTYL) 10 MG capsule Take 1 capsule (10 mg total) by mouth 4 (four) times daily -  before meals and at bedtime. (Patient not taking: Reported on 04/12/2023) 60 capsule 1   famotidine (PEPCID) 20 MG tablet Take 1 tablet (20 mg total) by mouth 2 (two) times daily. (Patient not taking: Reported on 06/08/2023) 60 tablet 0   metoCLOPramide (REGLAN) 10 MG tablet Take 1 tablet (10 mg total) by mouth every 6 (six) hours as needed. (Patient not taking: Reported on 06/08/2023) 30 tablet 0   naproxen sodium (ALEVE) 220 MG tablet Take 220 mg  by mouth. (Patient not taking: Reported on 06/08/2023)     sucralfate (CARAFATE) 1 g tablet Take 1 tablet (1 g total) by mouth 4 (four) times daily. (Patient not taking: Reported on 06/08/2023) 120 tablet 1     Review of Systems Full ROS  was asked and was negative except for the information on the HPI  Physical Exam Blood pressure 120/81, pulse 62, temperature 97.6 F (36.4 C), temperature source Oral, resp. rate 17, height 5\' 7"  (1.702 m), weight 86.2 kg, last menstrual period 05/30/2023, SpO2 95%, not currently breastfeeding. CONSTITUTIONAL: NAD. EYES: Pupils are equal, round, and reactive to light, Sclera are non-icteric. EARS, NOSE, MOUTH AND THROAT: The oropharynx is clear. The oral mucosa is pink and moist. Hearing is intact to voice. LYMPH NODES:  Lymph nodes in the neck are normal. RESPIRATORY:  Lungs are clear. There is normal respiratory effort, with equal breath sounds bilaterally, and without pathologic use of accessory muscles. CARDIOVASCULAR: Heart is regular without murmurs, gallops, or rubs. GI: The abdomen is  soft, tenderness on RUQ, no rebound, no guarding  There are no palpable masses. There is no hepatosplenomegaly. There are normal bowel sounds  GU: Rectal deferred.   MUSCULOSKELETAL: Normal muscle strength and tone. No cyanosis or edema.   SKIN: Turgor is good and there are no pathologic skin lesions or ulcers. NEUROLOGIC: Motor and sensation is grossly normal. Cranial nerves are grossly intact. PSYCH:  Oriented to person, place and time. Affect is normal.  Data Reviewed  I have personally reviewed the patient's imaging, laboratory findings and medical records.    Assessment/Plan 27 year old female with history of cholelithiasis and choledocholithiasis.  Discussed with patient in detail about her dx process.  Definitely recommend ERCP and cholecystectomy during this hospitalization.  The risks, benefits, complications, treatment options, and expected outcomes  were discussed with the patient. The possibilities of bleeding, recurrent infection, finding a normal gallbladder, perforation of viscus organs, damage to surrounding structures, bile leak, abscess formation, needing a drain placed, the need for additional procedures, reaction to medication, pulmonary aspiration,  failure to diagnose a condition, the possible need to convert to an open procedure, and creating a complication requiring transfusion or operation were discussed with the patient. The patient and/or family concurred with the proposed plan, giving informed consent.     Sterling Big, MD FACS General Surgeon 06/08/2023, 12:39 PM

## 2023-06-08 NOTE — Progress Notes (Signed)
Pharmacy Antibiotic Note  Victoria MCCRITE is a 27 y.o. female admitted on 06/08/2023 with  Intra-abdominal infection . Patient presents with RUQ pain with nausea and vomiting. Patient had RUQ [place] on 05/30/23 showing gallstones without signs of acute cholecystitis. In ED, patient is afebrile with WBC 11.1 and MRCP shows cholelithiasis with evidence of acute cholecystitis. Pharmacy has been consulted for Zosyn dosing.  Plan: Start Zosyn 3.375 g IV every 8 hours based on current renal function Monitor renal function, clinical status, culture data, and LOT F/u gastroenterology plans for ERCP  Height: 5\' 7"  (170.2 cm) Weight: 86.2 kg (190 lb) IBW/kg (Calculated) : 61.6  Temp (24hrs), Avg:97.7 F (36.5 C), Min:97.7 F (36.5 C), Max:97.7 F (36.5 C)  Recent Labs  Lab 06/08/23 0000  WBC 11.1*  CREATININE 0.66    Estimated Creatinine Clearance: 119.1 mL/min (by C-G formula based on SCr of 0.66 mg/dL).    Allergies  Allergen Reactions   Gabapentin Rash   Hydrocodone Rash   Tramadol Rash    Antimicrobials this admission: Zosyn 8/27 >>   Dose adjustments this admission: N/A  Microbiology results: N/A  Thank you for involving pharmacy in this patient's care.   Rockwell Alexandria, PharmD Clinical Pharmacist 06/08/2023 10:23 AM

## 2023-06-08 NOTE — Consult Note (Signed)
[name], MD [facility]  [address] [place], [place] [address] Phone: [phone] Fax : [phone]  Consultation  Referring Provider:     Dr. Chipper Herb Primary Care Physician:  [facility] Primary Gastroenterologist:       Dr. Allegra Lai    Reason for Consultation:     CBD stone  Date of Admission:  06/08/2023 Date of Consultation:  06/08/2023         HPI:   Victoria Villarreal is a 27 y.o. female who has a history of gallstones who came to the emergency department with right upper quadrant pain with nausea and vomiting.  The patient had an MRCP that showed her to have a distal common bile duct stone.  The patient's lipase was normal.  Her LFTs were also normal but her white cell count was 11.1.  The MRCP showed:  IMPRESSION: 1. Cholelithiasis with gallbladder wall thickening and enhancement. Findings are concerning for acute cholecystitis. 2. Mild intrahepatic and extrahepatic bile duct dilatation. Abrupt termination of the distal common bile duct is noted where a possible 3 mm impacted stone is noted. No other signs of choledocholithiasis identified.   I am now being consulted for an ERCP.  Past Medical History:  Diagnosis Date  . Anemia   . Depression   . Polysubstance abuse (HCC)    a.) marijuana + cocaine  . Pyelonephritis   . Trichomoniasis     Past Surgical History:  Procedure Laterality Date  . NO PAST SURGERIES      Prior to Admission medications   Medication Sig Start Date End Date Taking? Authorizing Provider  ondansetron (ZOFRAN) 4 MG tablet Take 1 tablet (4 mg total) by mouth daily as needed. 06/08/23 06/07/24 Yes Pilar Jarvis, MD  dicyclomine (BENTYL) 10 MG capsule Take 1 capsule (10 mg total) by mouth 4 (four) times daily -  before meals and at bedtime. Patient not taking: Reported on 04/12/2023 02/05/23   Merwyn Katos, MD  famotidine (PEPCID) 20 MG tablet Take 1 tablet (20 mg total) by mouth 2 (two) times daily. Patient not taking:  Reported on 06/08/2023 05/30/23   Sharman Cheek, MD  metoCLOPramide (REGLAN) 10 MG tablet Take 1 tablet (10 mg total) by mouth every 6 (six) hours as needed. Patient not taking: Reported on 06/08/2023 05/30/23   Sharman Cheek, MD  naproxen sodium (ALEVE) 220 MG tablet Take 220 mg by mouth. Patient not taking: Reported on 06/08/2023    [provider]  sucralfate (CARAFATE) 1 g tablet Take 1 tablet (1 g total) by mouth 4 (four) times daily. Patient not taking: Reported on 06/08/2023 05/30/23   Sharman Cheek, MD    History reviewed. No pertinent family history.   Social History   Tobacco Use  . Smoking status: Every Day    Current packs/day: 0.50    Types: Cigarettes    Passive exposure: Past  . Smokeless tobacco: Never  Vaping Use  . Vaping status: Never Used  Substance Use Topics  . Alcohol use: Not Currently    Alcohol/week: 1.0 standard drink of alcohol    Types: 1 Glasses of wine per week    Comment: intermittently  . Drug use: Yes    Types: Marijuana, Cocaine    Allergies as of 06/07/2023 - Review Complete 06/07/2023  Allergen Reaction Noted  . Gabapentin Rash 07/29/2022  . Hydrocodone Rash 02/23/2017  . Tramadol Rash 02/23/2017    Review of Systems:    All systems reviewed  and negative except where noted in HPI.   Physical Exam:  Vital signs in last 24 hours: Temp:  [96.5 F (35.8 C)-97.7 F (36.5 C)] 96.6 F (35.9 C) (08/27 1444) Pulse Rate:  [56-87] 63 (08/27 1302) Resp:  [15-18] 18 (08/27 1302) BP: (112-136)/(71-87) 136/84 (08/27 1444) SpO2:  [95 %-100 %] 100 % (08/27 1302) Weight:  [86.2 kg] 86.2 kg (08/26 2353)   General:   Pleasant, cooperative in NAD Head:  Normocephalic and atraumatic. Eyes:   No icterus.   Conjunctiva pink. PERRLA. Ears:  Normal auditory acuity. Neck:  Supple; no masses or thyroidomegaly Lungs: Respirations even and unlabored. Lungs clear to auscultation bilaterally.   No wheezes, crackles, or rhonchi.  Heart:   Regular rate and rhythm;  Without murmur, clicks, rubs or gallops Abdomen:  Soft, nondistended, nontender. Normal bowel sounds. No appreciable masses or hepatomegaly.  No rebound or guarding.  Rectal:  Not performed. Msk:  Symmetrical without gross deformities.    Extremities:  Without edema, cyanosis or clubbing. Neurologic:  Alert and oriented x3;  grossly normal neurologically. Skin:  Intact without significant lesions or rashes. Cervical Nodes:  No significant cervical adenopathy. Psych:  Alert and cooperative. Normal affect.  LAB RESULTS: Recent Labs    06/08/23 0000  WBC 11.1*  HGB 13.9  HCT 44.1  PLT 322   BMET Recent Labs    06/08/23 0000  NA 139  K 3.3*  CL 107  CO2 25  GLUCOSE 131*  BUN 10  CREATININE 0.66  CALCIUM 8.4*   LFT Recent Labs    06/08/23 0000  PROT 6.3*  ALBUMIN 3.2*  AST 36  ALT 20  ALKPHOS 62  BILITOT 0.5   PT/INR No results for input(s): "LABPROT", "INR" in the last 72 hours.  STUDIES: MR 3D Recon At Scanner  Result Date: 06/08/2023 : CLINICAL DATA: Right upper quadrant pain. Biliary disease suspected. EXAM: MRI ABDOMEN WITHOUT AND WITH CONTRAST (INCLUDING MRCP) TECHNIQUE: Multiplanar multisequence MR imaging of the abdomen was performed both before and after the administration of intravenous contrast. Heavily T2-weighted images of the biliary and pancreatic ducts were obtained, and three-dimensional MRCP images were rendered by post processing. CONTRAST: 7.60mL GADAVIST GADOBUTROL 1 MMOL/ML IV SOLN COMPARISON: MRI 06/21/2022 FINDINGS: Lower chest: No acute abnormality. Hepatobiliary: No suspicious enhancing liver lesions. Sludge noted within the dependent portion of the gallbladder. Gallbladder wall the largest stone measures 7 mm as noted on the ultrasound from earlier today. Gallbladder wall enhancement with thickening noted measuring up to 5 mm. Mild intrahepatic bile duct dilatation. Common bile duct is increased in caliber measuring 8 mm.  Abrupt termination of the distal common bile duct is noted where a possible 3 mm impacted stone is noted, image 12/21. No other signs of choledocholithiasis identified. Pancreas: No mass, inflammatory changes, or other parenchymal abnormality identified. Spleen: Within normal limits in size and appearance. Adrenals/Urinary Tract: Normal adrenal glands. No kidney mass, hydronephrosis or nephrolithiasis. Stomach/Bowel: Visualized portions within the abdomen are unremarkable. Vascular/Lymphatic: No pathologically enlarged lymph nodes identified. No abdominal aortic aneurysm demonstrated. Other: No ascites or focal fluid collections identified. Musculoskeletal: No suspicious bone lesions identified. IMPRESSION: 1. Cholelithiasis with gallbladder wall thickening and enhancement. Findings are concerning for acute cholecystitis. 2. Mild intrahepatic and extrahepatic bile duct dilatation. Abrupt termination of the distal common bile duct is noted where a possible 3 mm impacted stone is noted. No other signs of choledocholithiasis identified. Electronically Signed   By: Signa Kell M.D.   On: 06/08/2023  08:15   MR ABDOMEN MRCP W WO CONTAST  Result Date: 06/08/2023 CLINICAL DATA:  Right upper quadrant pain. Biliary disease suspected. EXAM: MRI ABDOMEN WITHOUT AND WITH CONTRAST (INCLUDING MRCP) TECHNIQUE: Multiplanar multisequence MR imaging of the abdomen was performed both before and after the administration of intravenous contrast. Heavily T2-weighted images of the biliary and pancreatic ducts were obtained, and three-dimensional MRCP images were rendered by post processing. CONTRAST:  7.75mL GADAVIST GADOBUTROL 1 MMOL/ML IV SOLN COMPARISON:  MRI 06/21/2022 FINDINGS: Lower chest: No acute abnormality. Hepatobiliary: No suspicious enhancing liver lesions. Sludge noted within the dependent portion of the gallbladder. Gallbladder wall the largest stone measures 7 mm as noted on the ultrasound from earlier today.  Gallbladder wall enhancement with thickening noted measuring up to 5 mm. Mild intrahepatic bile duct dilatation. Common bile duct is increased in caliber measuring 8 mm. Abrupt termination of the distal common bile duct is noted where a possible 3 mm impacted stone is noted, image 12/21. No other signs of choledocholithiasis identified. Pancreas: No mass, inflammatory changes, or other parenchymal abnormality identified. Spleen:  Within normal limits in size and appearance. Adrenals/Urinary Tract: Normal adrenal glands. No kidney mass, hydronephrosis or nephrolithiasis. Stomach/Bowel: Visualized portions within the abdomen are unremarkable. Vascular/Lymphatic: No pathologically enlarged lymph nodes identified. No abdominal aortic aneurysm demonstrated. Other:  No ascites or focal fluid collections identified. Musculoskeletal: No suspicious bone lesions identified. IMPRESSION: 1. Cholelithiasis with gallbladder wall thickening and enhancement. Findings are concerning for acute cholecystitis. 2. Mild intrahepatic and extrahepatic bile duct dilatation. Abrupt termination of the distal common bile duct is noted where a possible 3 mm impacted stone is noted. No other signs of choledocholithiasis identified. Electronically Signed   By: Signa Kell M.D.   On: 06/08/2023 07:34   US ABDOMEN LIMITED RUQ (LIVER/GB)  Result Date: 06/08/2023 CLINICAL DATA:  Right upper quadrant pain EXAM: ULTRASOUND ABDOMEN LIMITED RIGHT UPPER QUADRANT COMPARISON:  05/30/2023 FINDINGS: Gallbladder: Gallbladder is well distended with sludge and gallstones. Mild wall thickening at 3.9 mm is noted. Common bile duct: Diameter: 7 mm. Echogenicity is noted in the distal common bile duct with shadowing which may represent choledocholithiasis. Liver: No focal lesion identified. Within normal limits in parenchymal echogenicity. Portal vein is patent on color Doppler imaging with normal direction of blood flow towards the liver. Other: None.  IMPRESSION: Cholelithiasis with gallbladder sludge. Negative sonographic Murphy's sign is elicited all though the patient is medicated. Increase in the common bile duct diameter to 7 mm from 3.6 mm on the most recent exam. There are findings suggestive of distal choledocholithiasis. Electronically Signed   By: Alcide Clever M.D.   On: 06/08/2023 03:32      Impression / Plan:   Assessment: Principal Problem:   Acute cholecystitis Active Problems:   Cholangitis due to bile duct calculus with obstruction   Biliary colic   Choledocholithiasis   Victoria Villarreal is a 27 y.o. y/o female with with a history of gallstones who came in with nausea vomiting and abdominal pain in the right upper quadrant that was found to represent acute cholecystitis and a CBD stone with mild intra and extrahepatic ductal dilatation.  Plan:  Due to the findings above the patient will be brought to the endoscopy unit and an ERCP with stone extraction will be performed.  The patient has been explained the risks including pancreatitis bleeding perforation and death.  The patient has agreed to undergoing the procedure.  The patient should also have her gallbladder out this  admission if she does not have post ERCP pancreatitis.  The patient has been explained the plan and agrees with it.  Thank you for involving me in the care of this patient.      LOS: 0 days   [name], MD, [facility] 06/08/2023, 3:08 PM,  Pager [phone] 7am-5pm  Check AMION for 5pm -7am coverage and on weekends   Note: This dictation was prepared with Dragon dictation along with smaller phrase technology. Any transcriptional errors that result from this process are unintentional.

## 2023-06-08 NOTE — ED Notes (Signed)
Pt sitting on stretcher watching tv and using personal cell phone. Pt not in distress at this time. Respirations are even and unlabored, skin AfE. WCTM.

## 2023-06-08 NOTE — ED Provider Notes (Signed)
[facility] Provider Note    Event Date/Time   First MD Initiated Contact with Patient 06/08/23 0129     (approximate)   History   Abdominal Pain   HPI  Victoria Villarreal is a 27 y.o. female   Past medical history of known gallstones who presents to the emergency department with postprandial right upper quadrant pain and nausea and vomiting.  Reminiscent of her biliary colic diagnosed in the past, referred to surgery but unable to follow-up.  She denies any changes in her symptoms, denies any GI bleeding or urinary symptoms.  No fever or chills.   External Medical Documents Reviewed: Upper quadrant ultrasound obtained earlier this month with sludge and gallstones without signs of acute cholecystitis      Physical Exam   Triage Vital Signs: ED Triage Vitals  Encounter Vitals Group     BP 06/07/23 2347 130/87     Systolic BP Percentile --      Diastolic BP Percentile --      Pulse Rate 06/07/23 2347 84     Resp 06/07/23 2347 18     Temp 06/07/23 2347 97.7 F (36.5 C)     Temp Source 06/07/23 2347 Oral     SpO2 06/07/23 2347 99 %     Weight 06/07/23 2353 190 lb (86.2 kg)     Height 06/07/23 2353 5\' 7"  (1.702 m)     Head Circumference --      Peak Flow --      Pain Score 06/07/23 2353 10     Pain Loc --      Pain Education --      Exclude from Growth Chart --     Most recent vital signs: Vitals:   06/07/23 2347  BP: 130/87  Pulse: 84  Resp: 18  Temp: 97.7 F (36.5 C)  SpO2: 99%    General: Awake, no distress.  CV:  Good peripheral perfusion.  Resp:  Normal effort.  Abd:  No distention.  Other:  Right upper quadrant tenderness without rigidity or guarding.  Afebrile normal hemodynamics, nontoxic appearance.   ED Results / Procedures / Treatments   Labs (all labs ordered are listed, but only abnormal results are displayed) Labs Reviewed  CBC WITH DIFFERENTIAL/PLATELET - Abnormal; Notable for the following components:       Result Value   WBC 11.1 (*)    Lymphs Abs 5.1 (*)    All other components within normal limits  COMPREHENSIVE METABOLIC PANEL - Abnormal; Notable for the following components:   Potassium 3.3 (*)    Glucose, Bld 131 (*)    Calcium 8.4 (*)    Total Protein 6.3 (*)    Albumin 3.2 (*)    All other components within normal limits  LIPASE, BLOOD     I ordered and reviewed the above labs they are notable for mild leukocytosis 11.1, LFTs and lipase normal   RADIOLOGY I independently reviewed and interpreted US OF THE RIGHT UPPER QUADRANT AND SEE GALLSTONES BUT NO OBVIOUS EVIDENCE OF ACUTE CHOLECYSTITIS I also reviewed radiologist's formal read.   PROCEDURES:  Critical Care performed: No  Procedures   MEDICATIONS ORDERED IN ED: Medications  LORazepam (ATIVAN) injection 1 mg (has no administration in time range)  alum & mag hydroxide-simeth (MAALOX/MYLANTA) 200-200-20 MG/5ML suspension 30 mL (30 mLs Oral Given 06/08/23 0158)    And  lidocaine (XYLOCAINE) 2 % viscous mouth solution 15 mL (15 mLs Oral Given 06/08/23 0159)  ondansetron (ZOFRAN) injection 4 mg (4 mg Intravenous Given 06/08/23 0157)  pantoprazole (PROTONIX) injection 40 mg (40 mg Intravenous Given 06/08/23 0157)  ketorolac (TORADOL) 15 MG/ML injection 15 mg (15 mg Intravenous Given 06/08/23 0158)  morphine (PF) 4 MG/ML injection 4 mg (4 mg Intravenous Given 06/08/23 0159)  morphine (PF) 4 MG/ML injection 4 mg (4 mg Intravenous Given 06/08/23 0422)     IMPRESSION / MDM / ASSESSMENT AND PLAN / ED COURSE  I reviewed the triage vital signs and the nursing notes.                                Patient's presentation is most consistent with acute presentation with potential threat to life or bodily function.  Differential diagnosis includes, but is not limited to, cholecystitis, biliary colic, gallstones, pancreatitis, appendicitis   The patient is on the cardiac monitor to evaluate for evidence of arrhythmia and/or  significant heart rate changes.  MDM:    Symptoms consistent with her biliary colic and known gallstones.   Considered cholecystitis given her tenderness will take ultrasound to check for sonographic findings of cholecystitis.  However given her overall nontoxic appearance, no marked leukocytosis or fever, I have low suspicion for sepsis at this time.  If sonographic findings of cholecystitis will consult with surgery.  If not, will have her follow-up with outpatient general surgery Dr. Maia Plan.   -- No sonographic evidence of cholecystitis however CBD dilated compared to prior.  Concerning for choledocholithiasis given known gallstones and CBD dilation acutely worsened from prior when it was normal on recent testing, however LFTs within normal limits, bilirubin within normal limits, and patient not exhibiting signs of acute cholangitis without jaundice, or fever.  Continues to have some mild right upper quadrant pain, tenderness to palpation, will proceed with MRCP, symptomatic treatment, and disposition depending on MRCP results (SURG, GI, OR DC W SURG FU)        FINAL CLINICAL IMPRESSION(S) / ED DIAGNOSES   Final diagnoses:  Biliary colic  Gallstones     Rx / DC Orders   ED Discharge Orders          Ordered    ondansetron (ZOFRAN) 4 MG tablet  Daily PRN        06/08/23 0153             Note:  This document was prepared using Dragon voice recognition software and may include unintentional dictation errors.    Pilar Jarvis, MD 06/08/23 [phone]

## 2023-06-08 NOTE — Anesthesia Preprocedure Evaluation (Signed)
Anesthesia Evaluation  Patient identified by MRN, date of birth, ID band Patient awake    Reviewed: Allergy & Precautions, NPO status , Patient's Chart, lab work & pertinent test results  History of Anesthesia Complications Negative for: history of anesthetic complications  Airway Mallampati: III  TM Distance: >3 FB Neck ROM: full    Dental  (+) Chipped, Dental Advidsory Given   Pulmonary neg shortness of breath, neg sleep apnea, neg recent URI, Current Smoker and Patient abstained from smoking.   Pulmonary exam normal        Cardiovascular Exercise Tolerance: Good (-) hypertensionnegative cardio ROS Normal cardiovascular exam     Neuro/Psych  PSYCHIATRIC DISORDERS Anxiety Depression       GI/Hepatic negative GI ROS,neg GERD  ,,  Endo/Other    Renal/GU   negative genitourinary   Musculoskeletal   Abdominal   Peds  Hematology negative hematology ROS (+)   Anesthesia Other Findings Past Medical History: No date: Anemia No date: Depression No date: Trichomoniasis  Past Surgical History: No date: NO PAST SURGERIES     Reproductive/Obstetrics (+) Pregnancy                             Anesthesia Physical Anesthesia Plan  ASA: 2  Anesthesia Plan: General   Post-op Pain Management:    Induction: Intravenous  PONV Risk Score and Plan: 3 and Propofol infusion and TIVA  Airway Management Planned: Natural Airway and Nasal Cannula  Additional Equipment:   Intra-op Plan:   Post-operative Plan:   Informed Consent: I have reviewed the patients History and Physical, chart, labs and discussed the procedure including the risks, benefits and alternatives for the proposed anesthesia with the patient or authorized representative who has indicated his/her understanding and acceptance.     Dental Advisory Given  Plan Discussed with: Anesthesiologist  Anesthesia Plan Comments:  (Patient reports no bleeding problems and no anticoagulant use.   Patient consented for risks of anesthesia including but not limited to:  - adverse reactions to medications - risk of bleeding, infection and or nerve damage from epidural that could lead to paralysis - risk of headache or failed epidural - nerve damage due to positioning - that if epidural is used for C-section that there is a chance of epidural failure requiring spinal placement or conversion to GA - Damage to heart, brain, lungs, other parts of body or loss of life  Patient voiced understanding.)        Anesthesia Quick Evaluation

## 2023-06-09 ENCOUNTER — Inpatient Hospital Stay: Payer: Medicaid Other | Admitting: Certified Registered"

## 2023-06-09 ENCOUNTER — Encounter
Admission: EM | Disposition: A | Discharge status: Home / Self Care | Payer: Self-pay | Source: Home / Self Care | Attending: Internal Medicine

## 2023-06-09 ENCOUNTER — Encounter: Payer: Self-pay | Admitting: Internal Medicine

## 2023-06-09 DIAGNOSIS — K811 Chronic cholecystitis: Secondary | ICD-10-CM | POA: Diagnosis not present

## 2023-06-09 DIAGNOSIS — K810 Acute cholecystitis: Secondary | ICD-10-CM | POA: Diagnosis not present

## 2023-06-09 LAB — URINE DRUG SCREEN, QUALITATIVE (ARMC ONLY)
Amphetamines, Ur Screen: NOT DETECTED
Barbiturates, Ur Screen: NOT DETECTED
Benzodiazepine, Ur Scrn: NOT DETECTED
Cannabinoid 50 Ng, Ur ~~LOC~~: POSITIVE — AB
Cocaine Metabolite,Ur ~~LOC~~: POSITIVE — AB
MDMA (Ecstasy)Ur Screen: NOT DETECTED
Methadone Scn, Ur: NOT DETECTED
Opiate, Ur Screen: POSITIVE — AB
Phencyclidine (PCP) Ur S: NOT DETECTED
Tricyclic, Ur Screen: NOT DETECTED

## 2023-06-09 LAB — CBC
HCT: 37.9 % (ref 36.0–46.0)
Hemoglobin: 12.2 g/dL (ref 12.0–15.0)
MCH: 28.4 pg (ref 26.0–34.0)
MCHC: 32.2 g/dL (ref 30.0–36.0)
MCV: 88.3 fL (ref 80.0–100.0)
Platelets: 263 10*3/uL (ref 150–400)
RBC: 4.29 MIL/uL (ref 3.87–5.11)
RDW: 14.6 % (ref 11.5–15.5)
WBC: 3.9 10*3/uL — ABNORMAL LOW (ref 4.0–10.5)
nRBC: 0 % (ref 0.0–0.2)

## 2023-06-09 LAB — COMPREHENSIVE METABOLIC PANEL
ALT: 463 U/L — ABNORMAL HIGH (ref 0–44)
AST: 256 U/L — ABNORMAL HIGH (ref 15–41)
Albumin: 2.7 g/dL — ABNORMAL LOW (ref 3.5–5.0)
Alkaline Phosphatase: 120 U/L (ref 38–126)
Anion gap: 2 — ABNORMAL LOW (ref 5–15)
BUN: <5 mg/dL — ABNORMAL LOW (ref 6–20)
CO2: 24 mmol/L (ref 22–32)
Calcium: 7.9 mg/dL — ABNORMAL LOW (ref 8.9–10.3)
Chloride: 112 mmol/L — ABNORMAL HIGH (ref 98–111)
Creatinine, Ser: 0.63 mg/dL (ref 0.44–1.00)
GFR, Estimated: >60 mL/min (ref >60)
Glucose, Bld: 95 mg/dL (ref 70–99)
Potassium: 3.7 mmol/L (ref 3.5–5.1)
Sodium: 138 mmol/L (ref 135–145)
Total Bilirubin: 1.4 mg/dL — ABNORMAL HIGH (ref 0.3–1.2)
Total Protein: 5.4 g/dL — ABNORMAL LOW (ref 6.5–8.1)

## 2023-06-09 SURGERY — CHOLECYSTECTOMY, ROBOT-ASSISTED, LAPAROSCOPIC
Anesthesia: General | Site: Abdomen

## 2023-06-09 MED ORDER — OXYCODONE HCL 5 MG/5ML PO SOLN: 5.0000 mg | Freq: Once | ORAL | Status: DC | PRN

## 2023-06-09 MED ORDER — FENTANYL CITRATE (PF) 100 MCG/2ML IJ SOLN
INTRAMUSCULAR | Status: AC
  Filled 2023-06-09: qty 2

## 2023-06-09 MED ORDER — INDOCYANINE GREEN 25 MG IV SOLR
INTRAVENOUS | Status: DC | PRN
  Administered 2023-06-09: 5 mg via INTRAVENOUS

## 2023-06-09 MED ORDER — KETOROLAC TROMETHAMINE 30 MG/ML IJ SOLN
30.0000 mg | Freq: Four times a day (QID) | INTRAMUSCULAR | Status: DC
  Administered 2023-06-09: 30 mg via INTRAVENOUS

## 2023-06-09 MED ORDER — OXYCODONE HCL 5 MG PO TABS: 5.0000 mg | ORAL_TABLET | Freq: Four times a day (QID) | ORAL | 0 refills | Status: AC | PRN

## 2023-06-09 MED ORDER — SODIUM CHLORIDE 0.9 % IV SOLN
INTRAVENOUS | Status: DC | PRN
Start: 2023-06-09 — End: 2023-06-09

## 2023-06-09 MED ORDER — BUPIVACAINE-EPINEPHRINE 0.5% -1:200000 IJ SOLN
INTRAMUSCULAR | Status: DC | PRN
  Administered 2023-06-09: 50 mL via INTRAMUSCULAR

## 2023-06-09 MED ORDER — GLYCOPYRROLATE 0.2 MG/ML IJ SOLN
INTRAMUSCULAR | Status: DC | PRN
  Administered 2023-06-09: .2 mg via INTRAVENOUS

## 2023-06-09 MED ORDER — FENTANYL CITRATE (PF) 100 MCG/2ML IJ SOLN
25.0000 ug | INTRAMUSCULAR | Status: DC | PRN
  Administered 2023-06-09 (×2): 50 ug via INTRAVENOUS

## 2023-06-09 MED ORDER — ONDANSETRON HCL 4 MG/2ML IJ SOLN: 4.0000 mg | Freq: Once | INTRAMUSCULAR | Status: DC | PRN

## 2023-06-09 MED ORDER — BUPIVACAINE HCL (PF) 0.5 % IJ SOLN
INTRAMUSCULAR | Status: AC
  Filled 2023-06-09: qty 30

## 2023-06-09 MED ORDER — AMOXICILLIN-POT CLAVULANATE 875-125 MG PO TABS: 1.0000 | ORAL_TABLET | Freq: Two times a day (BID) | ORAL | 0 refills | Status: AC

## 2023-06-09 MED ORDER — MIDAZOLAM HCL 2 MG/2ML IJ SOLN
INTRAMUSCULAR | Status: DC | PRN
  Administered 2023-06-09: 2 mg via INTRAVENOUS

## 2023-06-09 MED ORDER — STERILE WATER FOR IRRIGATION IR SOLN
Status: DC | PRN
  Administered 2023-06-09: 1000 mL

## 2023-06-09 MED ORDER — LACTATED RINGERS IV SOLN: INTRAVENOUS | Status: DC | PRN

## 2023-06-09 MED ORDER — DEXMEDETOMIDINE HCL IN NACL 200 MCG/50ML IV SOLN
INTRAVENOUS | Status: DC | PRN
  Administered 2023-06-09: 12 ug via INTRAVENOUS
  Administered 2023-06-09: 4 ug via INTRAVENOUS
  Administered 2023-06-09 (×3): 8 ug via INTRAVENOUS

## 2023-06-09 MED ORDER — DEXAMETHASONE SODIUM PHOSPHATE 10 MG/ML IJ SOLN
INTRAMUSCULAR | Status: DC | PRN
  Administered 2023-06-09: 5 mg via INTRAVENOUS

## 2023-06-09 MED ORDER — ACETAMINOPHEN 500 MG PO TABS: 1000.0000 mg | ORAL_TABLET | Freq: Four times a day (QID) | ORAL | Status: DC

## 2023-06-09 MED ORDER — PROPOFOL 10 MG/ML IV BOLUS
INTRAVENOUS | Status: DC | PRN
  Administered 2023-06-09: 50 mg via INTRAVENOUS
  Administered 2023-06-09: 200 mg via INTRAVENOUS

## 2023-06-09 MED ORDER — KETOROLAC TROMETHAMINE 30 MG/ML IJ SOLN
INTRAMUSCULAR | Status: AC
  Filled 2023-06-09: qty 1

## 2023-06-09 MED ORDER — BUPIVACAINE LIPOSOME 1.3 % IJ SUSP
INTRAMUSCULAR | Status: AC
  Filled 2023-06-09: qty 20

## 2023-06-09 MED ORDER — SACCHAROMYCES BOULARDII 250 MG PO CAPS: 250.0000 mg | ORAL_CAPSULE | Freq: Two times a day (BID) | ORAL | 0 refills | Status: AC

## 2023-06-09 MED ORDER — LIDOCAINE HCL (CARDIAC) PF 100 MG/5ML IV SOSY
PREFILLED_SYRINGE | INTRAVENOUS | Status: DC | PRN
  Administered 2023-06-09: 100 mg via INTRAVENOUS

## 2023-06-09 MED ORDER — OXYCODONE HCL 5 MG PO TABS: 5.0000 mg | ORAL_TABLET | Freq: Once | ORAL | Status: DC | PRN

## 2023-06-09 MED ORDER — ONDANSETRON HCL 4 MG/2ML IJ SOLN
INTRAMUSCULAR | Status: DC | PRN
  Administered 2023-06-09 (×2): 4 mg via INTRAVENOUS

## 2023-06-09 MED ORDER — 0.9 % SODIUM CHLORIDE (POUR BTL) OPTIME
TOPICAL | Status: DC | PRN
  Administered 2023-06-09: 500 mL

## 2023-06-09 MED ORDER — ROCURONIUM BROMIDE 100 MG/10ML IV SOLN
INTRAVENOUS | Status: DC | PRN
  Administered 2023-06-09: 50 mg via INTRAVENOUS

## 2023-06-09 MED ORDER — EPINEPHRINE PF 1 MG/ML IJ SOLN
INTRAMUSCULAR | Status: AC
  Filled 2023-06-09: qty 1

## 2023-06-09 MED ORDER — FENTANYL CITRATE (PF) 100 MCG/2ML IJ SOLN
INTRAMUSCULAR | Status: DC | PRN
  Administered 2023-06-09 (×2): 50 ug via INTRAVENOUS

## 2023-06-09 MED ORDER — OXYCODONE HCL 5 MG PO TABS: 5.0000 mg | ORAL_TABLET | ORAL | Status: DC | PRN

## 2023-06-09 MED ORDER — MIDAZOLAM HCL 2 MG/2ML IJ SOLN
INTRAMUSCULAR | Status: AC
  Filled 2023-06-09: qty 2

## 2023-06-09 MED ORDER — SUGAMMADEX SODIUM 200 MG/2ML IV SOLN
INTRAVENOUS | Status: DC | PRN
  Administered 2023-06-09: 200 mg via INTRAVENOUS

## 2023-06-09 SURGICAL SUPPLY — 51 items
ADH SKN CLS APL DERMABOND .7 (GAUZE/BANDAGES/DRESSINGS) ×2
BULB RESERV EVAC DRAIN JP 100C (MISCELLANEOUS) IMPLANT
CANNULA REDUCER 12-8 DVNC XI (CANNULA) ×2 IMPLANT
CATH REDDICK CHOLANGI 4FR 50CM (CATHETERS) IMPLANT
CAUTERY HOOK MNPLR 1.6 DVNC XI (INSTRUMENTS) ×2 IMPLANT
CLIP LIGATING HEM O LOK PURPLE (MISCELLANEOUS) IMPLANT
CLIP LIGATING HEMO O LOK GREEN (MISCELLANEOUS) ×2 IMPLANT
DERMABOND ADVANCED .7 DNX12 (GAUZE/BANDAGES/DRESSINGS) ×2 IMPLANT
DRAIN CHANNEL JP 19F (MISCELLANEOUS) IMPLANT
DRAPE ARM DVNC X/XI (DISPOSABLE) ×8 IMPLANT
DRAPE COLUMN DVNC XI (DISPOSABLE) ×2 IMPLANT
ELECT CAUTERY BLADE 6.4 (BLADE) ×2 IMPLANT
ELECT REM PT RETURN 9FT ADLT (ELECTROSURGICAL) ×2
ELECTRODE REM PT RTRN 9FT ADLT (ELECTROSURGICAL) ×2 IMPLANT
FORCEPS BPLR R/ABLATION 8 DVNC (INSTRUMENTS) ×2 IMPLANT
FORCEPS PROGRASP DVNC XI (FORCEP) ×2 IMPLANT
GLOVE BIO SURGEON STRL SZ7 (GLOVE) ×4 IMPLANT
GOWN STRL REUS W/ TWL LRG LVL3 (GOWN DISPOSABLE) ×8 IMPLANT
GOWN STRL REUS W/TWL LRG LVL3 (GOWN DISPOSABLE) ×8
IRRIGATION STRYKERFLOW (MISCELLANEOUS) IMPLANT
IRRIGATOR STRYKERFLOW (MISCELLANEOUS) ×2
IV CATH ANGIO 12GX3 LT BLUE (NEEDLE) IMPLANT
KIT PINK PAD W/HEAD ARE REST (MISCELLANEOUS) ×2
KIT PINK PAD W/HEAD ARM REST (MISCELLANEOUS) ×2 IMPLANT
LABEL OR SOLS (LABEL) ×2 IMPLANT
MANIFOLD NEPTUNE II (INSTRUMENTS) ×2 IMPLANT
NDL HYPO 22X1.5 SAFETY MO (MISCELLANEOUS) ×2 IMPLANT
NEEDLE HYPO 22X1.5 SAFETY MO (MISCELLANEOUS) ×2 IMPLANT
NS IRRIG 500ML POUR BTL (IV SOLUTION) ×2 IMPLANT
OBTURATOR OPTICAL STND 8 DVNC (TROCAR) ×2
OBTURATOR OPTICALSTD 8 DVNC (TROCAR) ×2 IMPLANT
PACK LAP CHOLECYSTECTOMY (MISCELLANEOUS) ×2 IMPLANT
PENCIL SMOKE EVACUATOR (MISCELLANEOUS) ×2 IMPLANT
SEAL UNIV 5-12 XI (MISCELLANEOUS) ×8 IMPLANT
SET TUBE SMOKE EVAC HIGH FLOW (TUBING) ×2 IMPLANT
SOL ELECTROSURG ANTI STICK (MISCELLANEOUS) ×2
SOLUTION ELECTROSURG ANTI STCK (MISCELLANEOUS) ×2 IMPLANT
SPIKE FLUID TRANSFER (MISCELLANEOUS) ×2 IMPLANT
SPONGE DRAIN TRACH 4X4 STRL 2S (GAUZE/BANDAGES/DRESSINGS) IMPLANT
SPONGE T-LAP 18X18 ~~LOC~~+RFID (SPONGE) ×2 IMPLANT
SPONGE T-LAP 4X18 ~~LOC~~+RFID (SPONGE) IMPLANT
STOPCOCK 3WAY MALE LL (IV SETS) IMPLANT
SUT ETHILON 3-0 (SUTURE) IMPLANT
SUT MNCRL AB 4-0 PS2 18 (SUTURE) ×2 IMPLANT
SUT VICRYL 0 UR6 27IN ABS (SUTURE) ×4 IMPLANT
SYR 20ML LL LF (SYRINGE) IMPLANT
SYS BAG RETRIEVAL 10MM (BASKET) ×2
SYSTEM BAG RETRIEVAL 10MM (BASKET) ×2 IMPLANT
TRAP FLUID SMOKE EVACUATOR (MISCELLANEOUS) ×2 IMPLANT
WATER STERILE IRR 3000ML UROMA (IV SOLUTION) IMPLANT
WATER STERILE IRR 500ML POUR (IV SOLUTION) ×2 IMPLANT

## 2023-06-09 NOTE — Transfer of Care (Signed)
Immediate Anesthesia Transfer of Care Note  Patient: Victoria Villarreal  Procedure(s) Performed: XI ROBOTIC ASSISTED LAPAROSCOPIC CHOLECYSTECTOMY (Abdomen) INDOCYANINE GREEN FLUORESCENCE IMAGING (ICG)  Patient Location: PACU  Anesthesia Type:General  Level of Consciousness: drowsy and patient cooperative  Airway & Oxygen Therapy: Patient Spontanous Breathing and Patient connected to face mask oxygen  Post-op Assessment: Report given to RN and Post -op Vital signs reviewed and stable  Post vital signs: Reviewed and stable  Last Vitals:  Vitals Value Taken Time  BP 140/77 06/09/23 1330  Temp    Pulse 70 06/09/23 1333  Resp 19 06/09/23 1333  SpO2 100 % 06/09/23 1333  Vitals shown include unfiled device data.  Last Pain:  Vitals:   06/09/23 1032  TempSrc: Oral  PainSc:       Patients Stated Pain Goal: 0 (06/09/23 1032)  Complications: No notable events documented.

## 2023-06-09 NOTE — Anesthesia Procedure Notes (Signed)
Procedure Name: Intubation Date/Time: 06/09/2023 12:04 PM  Performed by: Mohammed Kindle, CRNAPre-anesthesia Checklist: Patient identified, Emergency Drugs available, Suction available and Patient being monitored Patient Re-evaluated:Patient Re-evaluated prior to induction Oxygen Delivery Method: Circle system utilized Preoxygenation: Pre-oxygenation with 100% oxygen Induction Type: IV induction Ventilation: Mask ventilation without difficulty Laryngoscope Size: McGraph and 1 Grade View: Grade I Tube type: Oral Tube size: 6.5 mm Number of attempts: 1 Airway Equipment and Method: Stylet Placement Confirmation: ETT inserted through vocal cords under direct vision, positive ETCO2, breath sounds checked- equal and bilateral and CO2 detector Secured at: 21 cm Tube secured with: Tape Dental Injury: Teeth and Oropharynx as per pre-operative assessment

## 2023-06-09 NOTE — Progress Notes (Signed)
IV removed without complications. Discharge education completed including jp drain care and incision care. Medications reviewed with patient. Patient is being wheeled to the medical mall exit to be discharged to the care of family in stable condition. Patient has ambulated around the corridor and has tolerated solid food.  Cornell Barman Allante Whitmire

## 2023-06-09 NOTE — Op Note (Signed)
Robotic assisted laparoscopic Cholecystectomy  Pre-operative Diagnosis: cholecystitis  Post-operative Diagnosis: same  Procedure:  Robotic assisted laparoscopic Cholecystectomy  Surgeon: Sterling Big, MD FACS  Anesthesia: Gen. 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 The benefits, complications, treatment options, and expected outcomes were discussed with the patient. The risks of bleeding, infection, recurrence of symptoms, failure to resolve symptoms, bile duct damage, bile duct leak, retained common bile duct stone, bowel injury, any of which could require further surgery and/or ERCP, stent, or papillotomy were reviewed with the patient. The likelihood of improving the patient's symptoms with return to their baseline status is good.  The patient and/or family concurred with the proposed plan, giving informed consent.  The patient was taken to Operating Room, identified  and the procedure verified as Laparoscopic Cholecystectomy.  A Time Out was held and the above information confirmed.  Prior to the induction of general anesthesia, antibiotic prophylaxis was administered. VTE prophylaxis was in place. General endotracheal anesthesia was then administered and tolerated well. After the induction, the abdomen was prepped with Chloraprep and draped in the sterile fashion. The patient was positioned in the supine position.  Cut down technique was used to enter the abdominal cavity and a Hasson trochar was placed after two vicryl stitches were anchored to the fascia. Pneumoperitoneum was then created with CO2 and tolerated well without any adverse changes in the patient's vital signs.  Three 8-mm ports were placed under direct vision. All skin incisions  were infiltrated with a local anesthetic  agent before making the incision and placing the trocars.   The patient was positioned  in reverse Trendelenburg, robot was brought to the surgical field and docked in the standard fashion.  We made sure all the instrumentation was kept indirect view at all times and that there were no collision between the arms. I scrubbed out and went to the console.  The gallbladder was identified, the fundus grasped and retracted cephalad. Severe cholecystis was found, w significant inflammatory response around the infundibulum. Adhesions were lysed bluntly. The infundibulum was grasped and retracted laterally, exposing the peritoneum overlying the triangle of Calot. This was then divided and exposed in a blunt fashion. An extended critical view of the cystic duct and cystic artery was obtained.  The cystic duct was clearly identified and bluntly dissected.   Artery and duct were double clipped and divided. The cystic duct was dilated and Needed to used extra large clip applier.  Using ICG cholangiography we visualize the cystic duct and CBD w/o biliary injuries. The gallbladder was taken from the gallbladder fossa in a retrograde fashion with the electrocautery. There was empyema of the gallbladder. Pus was aspirated and RUQ was irrigated profusely. I place 19 Blake drain GB fossa. Hemostasis was achieved with the electrocautery. nspection of the right upper quadrant was performed. No bleeding, bile duct injury or leak, or bowel injury was noted. Robotic instruments and robotic arms were undocked in the standard fashion.  I scrubbed back in.  The gallbladder was removed and placed in an Endocatch bag.   Pneumoperitoneum was released.  The periumbilical port site was closed with interrumpted 0 Vicryl sutures. 4-0 subcuticular Monocryl was used to close the skin. Dermabond was  applied.  The  patient was then extubated and brought to the recovery room in stable condition. Sponge, lap, and needle counts were correct at  closure and at the conclusion of the case.               Sterling Big, MD, FACS

## 2023-06-09 NOTE — Plan of Care (Signed)
Discharge.  Victoria Villarreal

## 2023-06-09 NOTE — Progress Notes (Signed)
Pharmacy Antibiotic Note  Victoria Villarreal is a 27 y.o. female admitted on 06/08/2023 with  Intra-abdominal infection . Patient presents with RUQ pain with nausea and vomiting. Patient had RUQ [place] on 05/30/23 showing gallstones without signs of acute cholecystitis. Patient continues to be afebrile with WBC 11.1 >> 3.9. MRCP showed cholelithiasis with thickening and enhancement - evidence of acute cholecystitis - with possible impacted stone. 8/28 planned ERCP with stone extraction/source control. Pharmacy has been consulted for Zosyn dosing.  Plan: Continue Zosyn 3.375 g IV every 8 hours  Monitor renal function (Scr < 1) and clinical status F/u gastroenterology plans for antibiotics post ERCP   Height: 5\' 7"  (170.2 cm) Weight: 86.2 kg (190 lb 0.6 oz) IBW/kg (Calculated) : 61.6  Temp (24hrs), Avg:97.6 F (36.4 C), Min:96.5 F (35.8 C), Max:98.1 F (36.7 C)  Recent Labs  Lab 06/08/23 0000 06/09/23 0530  WBC 11.1* 3.9*  CREATININE 0.66 0.63    Estimated Creatinine Clearance: 119.1 mL/min (by C-G formula based on SCr of 0.63 mg/dL).    Allergies  Allergen Reactions   Gabapentin Rash   Hydrocodone Rash   Tramadol Rash    Antimicrobials this admission: Zosyn 8/27 >>   Dose adjustments this admission: N/A  Microbiology results: N/A  Thank you for involving pharmacy in this patient's care.   Gara Kroner, PharmD PGY1 Pharmacy Resident  06/09/2023 11:52 AM

## 2023-06-09 NOTE — Discharge Summary (Signed)
Physician Discharge Summary   Patient: Victoria Villarreal MRN: [id number] DOB: 01-Nov-1995  Admit date:     06/08/2023  Discharge date: 06/09/23  Discharge Physician: Adelyn Roscher   PCP: [facility]   Recommendations at discharge:   Complete antibiotic therapy as recommended Keep scheduled follow-up appointment with surgery Repeat LFTs as an outpatient  Discharge Diagnoses: Principal Problem:   Acute cholecystitis Active Problems:   Transaminitis   Cholangitis due to bile duct calculus with obstruction   Biliary colic   Choledocholithiasis  Resolved Problems:   * No resolved hospital problems. *  Hospital Course:  Victoria Villarreal is a 27 y.o. female with medical history significant of cholelithiasis, presented with worsening of abdominal pain.   Patient was first diagnosed with cholelithiasis last year during her pregnancy, incidentally by abdominal ultrasound she was asymptomatic at that point.  This year, about 4-5 months ago she started to have intermittent RUQ abdominal pain and has had several ED visit.  She was diagnosed with symptomatic gallstones and referred to see general surgeon.  Her last ED visit was 1 week ago, when she was stabilized and discharged home.  She was given the follow-up information however she said due to technical problem of her cell phone she has not follow-up and missed the outpatient elective surgery.  Last night she started to have a severe 10/10 cramping-like RUQ abdominal pain radiating to the back feeling nausea but no vomiting denies any fever or chills.   ED Course: Afebrile, no tachycardia no hypotension.  WBC 11.1, K3.3 AST ALT within normal limits bilirubin<1.0.  MRCP showed cholelithiasis with gallbladder wall thickening and enhancement concerning for acute cholecystitis and abrupt termination of distal CBD with a possible 3 mm impact gallstone.   Patient was started on Zosyn and GI was consulted who planned for emergency ERCP.      Assessment and Plan:   Acute cholecystitis Active Problems:     Acute gallstone cholecystitis Patient with a known history of cholelithiasis who presented to the ER for evaluation of severe right upper quadrant pain MRCP showed cholelithiasis with gallbladder wall thickening and enhancement. Findings are concerning for acute cholecystitis. Mild intrahepatic and extrahepatic bile duct dilatation. Abrupt termination of the distal common bile duct is noted where a possible 3 mm impacted stone is noted. No other signs of choledocholithiasis identified. Patient is status post ERCP with removal of 1 stone accomplished by biliary sphincterotomy and balloon extraction. Patient is status post laparoscopic cholecystectomy and will be discharged home per surgeons recommendation She will need to complete a 10-day course of Augmentin 875 mg twice daily Noted to have transaminitis post ERCP and will have repeat LFTs done as an outpatient      Hypokalemia Supplemented         Consultants: Gastroenterology, surgery Procedures performed: ERCP, cholecystectomy Disposition: Home Diet recommendation:  Discharge Diet Orders (From admission, onward)     Start     Ordered   06/09/23 0000  Diet - low sodium heart healthy        06/09/23 1630   06/09/23 0000  Diet general        06/09/23 1630           Regular diet DISCHARGE MEDICATION: Allergies as of 06/09/2023       Reactions   Gabapentin Rash   Hydrocodone Rash   Tramadol Rash        Medication List     STOP taking these medications  dicyclomine 10 MG capsule Commonly known as: BENTYL   famotidine 20 MG tablet Commonly known as: PEPCID   metoCLOPramide 10 MG tablet Commonly known as: REGLAN   naproxen sodium 220 MG tablet Commonly known as: ALEVE   sucralfate 1 g tablet Commonly known as: Carafate       TAKE these medications    amoxicillin-clavulanate 875-125 MG tablet Commonly known as:  AUGMENTIN Take 1 tablet by mouth 2 (two) times daily for 10 days.   ondansetron 4 MG tablet Commonly known as: Zofran Take 1 tablet (4 mg total) by mouth daily as needed.   oxyCODONE 5 MG immediate release tablet Commonly known as: Oxy IR/ROXICODONE Take 1 tablet (5 mg total) by mouth every 6 (six) hours as needed for up to 3 days for severe pain.   saccharomyces boulardii 250 MG capsule Commonly known as: Florastor Take 1 capsule (250 mg total) by mouth 2 (two) times daily for 10 days.        Follow-up Information     Donovan Kail, PA-C. Go in 1 week(s).   Specialty: Physician Assistant Why: s/p cholecystectomy, with drain Contact information: [address] [place] [address] [phone]                Discharge Exam: Ceasar Mons Weights   06/07/23 2353 06/09/23 1032  Weight: 86.2 kg 86.2 kg   Eyes: PERRL, lids and conjunctivae normal ENMT: Mucous membranes are moist. Posterior pharynx clear of any exudate or lesions.Normal dentition.  Neck: normal, supple, no masses, no thyromegaly Respiratory: clear to auscultation bilaterally, no wheezing, no crackles. Normal respiratory effort. No accessory muscle use.  Cardiovascular: Regular rate and rhythm, no murmurs / rubs / gallops. No extremity edema. 2+ pedal pulses. No carotid bruits.  Abdomen: tenderness on RUQ, no rebound, no guarding, no masses palpated. No hepatosplenomegaly. Bowel sounds positive.  Musculoskeletal: no clubbing / cyanosis. No joint deformity upper and lower extremities. Good ROM, no contractures. Normal muscle tone.  Skin: no rashes, lesions, ulcers. No induration Neurologic: CN 2-12 grossly intact. Sensation intact, DTR normal. Strength 5/5 in all 4.  Psychiatric: Normal judgment and insight. Alert and oriented x 3. Normal mood.   Condition at discharge: stable  The results of significant diagnostics from this hospitalization (including imaging, microbiology, ancillary and  laboratory) are listed below for reference.   Imaging Studies: DG C-Arm 1-60 Min-No Report  Result Date: 06/08/2023 Fluoroscopy was utilized by the requesting physician.  No radiographic interpretation.   MR 3D Recon At Scanner  Result Date: 06/08/2023 : CLINICAL DATA: Right upper quadrant pain. Biliary disease suspected. EXAM: MRI ABDOMEN WITHOUT AND WITH CONTRAST (INCLUDING MRCP) TECHNIQUE: Multiplanar multisequence MR imaging of the abdomen was performed both before and after the administration of intravenous contrast. Heavily T2-weighted images of the biliary and pancreatic ducts were obtained, and three-dimensional MRCP images were rendered by post processing. CONTRAST: 7.4mL GADAVIST GADOBUTROL 1 MMOL/ML IV SOLN COMPARISON: MRI 06/21/2022 FINDINGS: Lower chest: No acute abnormality. Hepatobiliary: No suspicious enhancing liver lesions. Sludge noted within the dependent portion of the gallbladder. Gallbladder wall the largest stone measures 7 mm as noted on the ultrasound from earlier today. Gallbladder wall enhancement with thickening noted measuring up to 5 mm. Mild intrahepatic bile duct dilatation. Common bile duct is increased in caliber measuring 8 mm. Abrupt termination of the distal common bile duct is noted where a possible 3 mm impacted stone is noted, image 12/21. No other signs of choledocholithiasis identified. Pancreas: No mass, inflammatory  changes, or other parenchymal abnormality identified. Spleen: Within normal limits in size and appearance. Adrenals/Urinary Tract: Normal adrenal glands. No kidney mass, hydronephrosis or nephrolithiasis. Stomach/Bowel: Visualized portions within the abdomen are unremarkable. Vascular/Lymphatic: No pathologically enlarged lymph nodes identified. No abdominal aortic aneurysm demonstrated. Other: No ascites or focal fluid collections identified. Musculoskeletal: No suspicious bone lesions identified. IMPRESSION: 1. Cholelithiasis with gallbladder wall  thickening and enhancement. Findings are concerning for acute cholecystitis. 2. Mild intrahepatic and extrahepatic bile duct dilatation. Abrupt termination of the distal common bile duct is noted where a possible 3 mm impacted stone is noted. No other signs of choledocholithiasis identified. Electronically Signed   By: Signa Kell M.D.   On: 06/08/2023 08:15   MR ABDOMEN MRCP W WO CONTAST  Result Date: 06/08/2023 CLINICAL DATA:  Right upper quadrant pain. Biliary disease suspected. EXAM: MRI ABDOMEN WITHOUT AND WITH CONTRAST (INCLUDING MRCP) TECHNIQUE: Multiplanar multisequence MR imaging of the abdomen was performed both before and after the administration of intravenous contrast. Heavily T2-weighted images of the biliary and pancreatic ducts were obtained, and three-dimensional MRCP images were rendered by post processing. CONTRAST:  7.53mL GADAVIST GADOBUTROL 1 MMOL/ML IV SOLN COMPARISON:  MRI 06/21/2022 FINDINGS: Lower chest: No acute abnormality. Hepatobiliary: No suspicious enhancing liver lesions. Sludge noted within the dependent portion of the gallbladder. Gallbladder wall the largest stone measures 7 mm as noted on the ultrasound from earlier today. Gallbladder wall enhancement with thickening noted measuring up to 5 mm. Mild intrahepatic bile duct dilatation. Common bile duct is increased in caliber measuring 8 mm. Abrupt termination of the distal common bile duct is noted where a possible 3 mm impacted stone is noted, image 12/21. No other signs of choledocholithiasis identified. Pancreas: No mass, inflammatory changes, or other parenchymal abnormality identified. Spleen:  Within normal limits in size and appearance. Adrenals/Urinary Tract: Normal adrenal glands. No kidney mass, hydronephrosis or nephrolithiasis. Stomach/Bowel: Visualized portions within the abdomen are unremarkable. Vascular/Lymphatic: No pathologically enlarged lymph nodes identified. No abdominal aortic aneurysm demonstrated.  Other:  No ascites or focal fluid collections identified. Musculoskeletal: No suspicious bone lesions identified. IMPRESSION: 1. Cholelithiasis with gallbladder wall thickening and enhancement. Findings are concerning for acute cholecystitis. 2. Mild intrahepatic and extrahepatic bile duct dilatation. Abrupt termination of the distal common bile duct is noted where a possible 3 mm impacted stone is noted. No other signs of choledocholithiasis identified. Electronically Signed   By: Signa Kell M.D.   On: 06/08/2023 07:34   US ABDOMEN LIMITED RUQ (LIVER/GB)  Result Date: 06/08/2023 CLINICAL DATA:  Right upper quadrant pain EXAM: ULTRASOUND ABDOMEN LIMITED RIGHT UPPER QUADRANT COMPARISON:  05/30/2023 FINDINGS: Gallbladder: Gallbladder is well distended with sludge and gallstones. Mild wall thickening at 3.9 mm is noted. Common bile duct: Diameter: 7 mm. Echogenicity is noted in the distal common bile duct with shadowing which may represent choledocholithiasis. Liver: No focal lesion identified. Within normal limits in parenchymal echogenicity. Portal vein is patent on color Doppler imaging with normal direction of blood flow towards the liver. Other: None. IMPRESSION: Cholelithiasis with gallbladder sludge. Negative sonographic Murphy's sign is elicited all though the patient is medicated. Increase in the common bile duct diameter to 7 mm from 3.6 mm on the most recent exam. There are findings suggestive of distal choledocholithiasis. Electronically Signed   By: Alcide Clever M.D.   On: 06/08/2023 03:32   US ABDOMEN LIMITED RUQ (LIVER/GB)  Result Date: 05/30/2023 CLINICAL DATA:  Right upper quadrant pain for 1 day. History of gallstones.  EXAM: ULTRASOUND ABDOMEN LIMITED RIGHT UPPER QUADRANT COMPARISON:  02/05/2023 FINDINGS: Gallbladder: Stones identified within the gallbladder measuring up to 7 mm. Gallbladder sludge is present. No gallbladder wall thickening, pericholecystic fluid or positive sonographic  Murphy's sign. Common bile duct: Diameter: 3.6 mm. Liver: No focal lesion identified. Within normal limits in parenchymal echogenicity. Portal vein is patent on color Doppler imaging with normal direction of blood flow towards the liver. Other: None. IMPRESSION: 1. No acute findings. 2. Gallbladder sludge and gallstones without signs of acute cholecystitis. Electronically Signed   By: Signa Kell M.D.   On: 05/30/2023 09:42    Microbiology: Results for orders placed or performed during the hospital encounter of 06/21/22  Urine Culture     Status: Abnormal   Collection Time: 06/21/22  9:13 AM   Specimen: Urine, Clean Catch  Result Value Ref Range Status   Specimen Description   Final    URINE, CLEAN CATCH Performed at [facility], [address], [place], [place] [address]    Special Requests   Final    NONE Performed at [facility], [address] [place], [place] [address]    Culture >=100,000 COLONIES/mL ESCHERICHIA COLI (A)  Final   Report Status 06/23/2022 FINAL  Final   Organism ID, Bacteria ESCHERICHIA COLI (A)  Final      Susceptibility   Escherichia coli - MIC*    AMPICILLIN >=32 RESISTANT Resistant     CEFAZOLIN <=4 SENSITIVE Sensitive     CEFEPIME <=0.12 SENSITIVE Sensitive     CEFTRIAXONE <=0.25 SENSITIVE Sensitive     CIPROFLOXACIN <=0.25 SENSITIVE Sensitive     GENTAMICIN <=1 SENSITIVE Sensitive     IMIPENEM <=0.25 SENSITIVE Sensitive     NITROFURANTOIN <=16 SENSITIVE Sensitive     TRIMETH/SULFA <=20 SENSITIVE Sensitive     AMPICILLIN/SULBACTAM 8 SENSITIVE Sensitive     PIP/TAZO <=4 SENSITIVE Sensitive     * >=100,000 COLONIES/mL ESCHERICHIA COLI  SARS Coronavirus 2 by RT PCR (hospital order, performed in [facility] hospital lab) *cepheid single result test* Anterior Nasal Swab     Status: None   Collection Time: 06/21/22  2:22 PM   Specimen: Anterior Nasal Swab  Result Value Ref Range Status   SARS Coronavirus 2 by RT PCR NEGATIVE NEGATIVE  Final    Comment: (NOTE) SARS-CoV-2 target nucleic acids are NOT DETECTED.  The SARS-CoV-2 RNA is generally detectable in upper and lower respiratory specimens during the acute phase of infection. The lowest concentration of SARS-CoV-2 viral copies this assay can detect is 250 copies / mL. A negative result does not preclude SARS-CoV-2 infection and should not be used as the sole basis for treatment or other patient management decisions.  A negative result may occur with improper specimen collection / handling, submission of specimen other than nasopharyngeal swab, presence of viral mutation(s) within the areas targeted by this assay, and inadequate number of viral copies (<250 copies / mL). A negative result must be combined with clinical observations, patient history, and epidemiological information.  Fact Sheet for Patients:   RoadLapTop.co.za  Fact Sheet for Healthcare Providers: http://kim-miller.com/  This test is not yet approved or  cleared by the [place] FDA and has been authorized for detection and/or diagnosis of SARS-CoV-2 by FDA under an Emergency Use Authorization (EUA).  This EUA will remain in effect (meaning this test can be used) for the duration of the COVID-19 declaration under Section 564(b)(1) of the Act, 21 U.S.C. section 360bbb-3(b)(1), unless the authorization  is terminated or revoked sooner.  Performed at [facility], [address] [place], [place] [address]     Labs: CBC: Recent Labs  Lab 06/08/23 0000 06/09/23 0530  WBC 11.1* 3.9*  NEUTROABS 4.9  --   HGB 13.9 12.2  HCT 44.1 37.9  MCV 89.1 88.3  PLT 322 263   Basic Metabolic Panel: Recent Labs  Lab 06/08/23 0000 06/09/23 0530  NA 139 138  K 3.3* 3.7  CL 107 112*  CO2 25 24  GLUCOSE 131* 95  BUN 10 <5*  CREATININE 0.66 0.63  CALCIUM 8.4* 7.9*   Liver Function Tests: Recent Labs  Lab 06/08/23 0000 06/09/23 0530   AST 36 256*  ALT 20 463*  ALKPHOS 62 120  BILITOT 0.5 1.4*  PROT 6.3* 5.4*  ALBUMIN 3.2* 2.7*   CBG: No results for input(s): "GLUCAP" in the last 168 hours.  Discharge time spent: greater than 30 minutes.  Signed: Lucile Shutters, MD [facility] 06/09/2023

## 2023-06-09 NOTE — Progress Notes (Signed)
[facility] SURGICAL PROGRESS NOTE  Hospital Day(s): 1.   Post op day(s): 1 Day Post-Op.   Interval History:  Patient seen and examined No acute events or new complaints overnight.  Patient reports she is doing okay Sore in RUQ; better than admission No fever, chills, nausea, emesis Mild leukopenia to 3.9K Hgb to 12.2 Renal function normal; sCr - 0.63; UO - unmeasured No electrolyte derangements LFTs elevated She is NPO Continues on Zosyn  Vital signs in last 24 hours: [min-max] current  Temp:  [96.5 F (35.8 C)-98.1 F (36.7 C)] 98.1 F (36.7 C) (08/28 0234) Pulse Rate:  [51-87] 55 (08/28 0234) Resp:  [14-18] 18 (08/28 0234) BP: (111-136)/(67-92) 122/76 (08/28 0234) SpO2:  [95 %-100 %] 99 % (08/28 0234)     Height: 5\' 7"  (170.2 cm) Weight: 86.2 kg BMI (Calculated): 29.75   Intake/Output last 2 shifts:  08/27 0701 - 08/28 0700 In: 2208.2 [P.O.:480; I.V.:1678.2; IV Piggyback:50] Out: -    Physical Exam:  Constitutional: alert, cooperative and no distress  Respiratory: breathing non-labored at rest  Cardiovascular: regular rate and sinus rhythm  Gastrointestinal: soft, mild soreness in RUQ, and non-distended, no rebound/guarding  Integumentary: Warm, dry  Labs:     Latest Ref Rng & Units 06/09/2023    5:30 AM 06/08/2023   12:00 AM 05/30/2023    8:18 AM  CBC  WBC 4.0 - 10.5 K/uL 3.9  11.1  7.6   Hemoglobin 12.0 - 15.0 g/dL 59.5  63.8  75.6   Hematocrit 36.0 - 46.0 % 37.9  44.1  45.3   Platelets 150 - 400 K/uL 263  322  345       Latest Ref Rng & Units 06/09/2023    5:30 AM 06/08/2023   12:00 AM 05/30/2023    8:18 AM  CMP  Glucose 70 - 99 mg/dL 95  433  295   BUN 6 - 20 mg/dL 5  10  6    Creatinine 0.44 - 1.00 mg/dL 1.88  4.16  6.06   Sodium 135 - 145 mmol/L 138  139  135   Potassium 3.5 - 5.1 mmol/L 3.7  3.3  3.6   Chloride 98 - 111 mmol/L 112  107  104   CO2 22 - 32 mmol/L 24  25  21    Calcium 8.9 - 10.3 mg/dL 7.9  8.4  8.4   Total Protein  6.5 - 8.1 g/dL 5.4  6.3  7.1   Total Bilirubin 0.3 - 1.2 mg/dL 1.4  0.5  0.5   Alkaline Phos 38 - 126 U/L 120  62  46   AST 15 - 41 U/L 256  36  12   ALT 0 - 44 U/L 463  20  14      Imaging studies: No new pertinent imaging studies   Assessment/Plan:  27 y.o. female with choledocholithiasis s/p ERCP on 08/28   - Plan for robotic assisted laparoscopic cholecystectomy this afternoon with Dr Everlene Farrier pending OR/Anesthesia availability  - All risks, benefits, and alternatives to above procedure(s) were discussed with the patient, all of her questions were answered to her expressed satisfaction, patient expresses she wishes to proceed, and informed consent was obtained.    - NPO for planned procedure; Okay to resume diet post-operatively   - Continue IV Abx (Zosyn)   - Monitor abdominal examination; on-going bowel function   - Pain control prn; antiemetic prn  - Mobilize  - Further management per primary service   -  Discharge Planning: She wishes to go home after surgery, which is reasonable as long as no surprises. I updated her on discharge instructions and updated follow up.   All of the above findings and recommendations were discussed with the patient, and the medical team, and all of patient's questions were answered to her expressed satisfaction.  -- Lynden Oxford, PA-C [facility] 06/09/2023, 7:40 AM M-F: 7am - 4pm

## 2023-06-09 NOTE — Plan of Care (Signed)

## 2023-06-09 NOTE — TOC CM/SW Note (Signed)
Transition of Care [facility]) - Inpatient Brief Assessment   Patient Details  Name: Victoria Villarreal MRN: [id number] Date of Birth: November 26, 1995  Transition of Care [facility]) CM/SW Contact:    Chapman Fitch, RN Phone Number: 06/09/2023, 10:13 AM   Clinical Narrative:   Transition of Care (TOC) Screening Note   Patient Details  Name: Victoria Villarreal Date of Birth: 05/11/1996   Transition of Care [facility]) CM/SW Contact:    Chapman Fitch, RN Phone Number: 06/09/2023, 10:13 AM    Transition of Care Department [facility]) has reviewed patient and no TOC needs have been identified at this time. We will continue to monitor patient advancement through interdisciplinary progression rounds. If new patient transition needs arise, please place a TOC consult.    Transition of Care Asessment: Insurance and Status: Insurance coverage has been reviewed Patient has primary care physician: Yes     Prior/Current Home Services: No current home services Social Determinants of Health Reivew: SDOH reviewed no interventions necessary Readmission risk has been reviewed: Yes Transition of care needs: no transition of care needs at this time

## 2023-06-09 NOTE — Anesthesia Preprocedure Evaluation (Signed)
Anesthesia Evaluation  Patient identified by MRN, date of birth, ID band Patient awake    Reviewed: Allergy & Precautions, NPO status , Patient's Chart, lab work & pertinent test results  History of Anesthesia Complications Negative for: history of anesthetic complications  Airway Mallampati: III  TM Distance: >3 FB Neck ROM: full    Dental  (+) Teeth Intact   Pulmonary neg shortness of breath, neg sleep apnea, neg recent URI, Current Smoker and Patient abstained from smoking.   Pulmonary exam normal breath sounds clear to auscultation       Cardiovascular Exercise Tolerance: Good (-) hypertensionnegative cardio ROS Normal cardiovascular exam Rhythm:Regular Rate:Normal - Systolic murmurs    Neuro/Psych  PSYCHIATRIC DISORDERS Anxiety Depression       GI/Hepatic negative GI ROS,neg GERD  ,,(+)     substance abuse  cocaine useCocaine positive on tox screen, patient says last use was 3-4 days ago. Does not appear to be acutely intoxicated   Endo/Other    Renal/GU   negative genitourinary   Musculoskeletal   Abdominal   Peds  Hematology negative hematology ROS (+)   Anesthesia Other Findings Past Medical History: No date: Anemia No date: Depression No date: Trichomoniasis  Past Surgical History: No date: NO PAST SURGERIES     Reproductive/Obstetrics (+) Pregnancy                             Anesthesia Physical Anesthesia Plan  ASA: 2  Anesthesia Plan: General   Post-op Pain Management:    Induction: Intravenous  PONV Risk Score and Plan: 3 and Propofol infusion, TIVA, Midazolam, Ondansetron and Dexamethasone  Airway Management Planned: Oral ETT  Additional Equipment: None  Intra-op Plan:   Post-operative Plan: Extubation in OR  Informed Consent: I have reviewed the patients History and Physical, chart, labs and discussed the procedure including the risks, benefits and  alternatives for the proposed anesthesia with the patient or authorized representative who has indicated his/her understanding and acceptance.     Dental Advisory Given  Plan Discussed with: Anesthesiologist  Anesthesia Plan Comments: (Discussed risks of anesthesia with patient, including PONV, sore throat, lip/dental/eye damage. Rare risks discussed as well, such as cardiorespiratory and neurological sequelae, and allergic reactions. Discussed the role of CRNA in patient's perioperative care. Patient understands.)        Anesthesia Quick Evaluation

## 2023-06-09 NOTE — Anesthesia Postprocedure Evaluation (Signed)
Anesthesia Post Note  Patient: Victoria Villarreal  Procedure(s) Performed: XI ROBOTIC ASSISTED LAPAROSCOPIC CHOLECYSTECTOMY (Abdomen) INDOCYANINE GREEN FLUORESCENCE IMAGING (ICG)  Patient location during evaluation: PACU Anesthesia Type: General Level of consciousness: awake and alert Pain management: pain level controlled Vital Signs Assessment: post-procedure vital signs reviewed and stable Respiratory status: spontaneous breathing, nonlabored ventilation, respiratory function stable and patient connected to nasal cannula oxygen Cardiovascular status: blood pressure returned to baseline and stable Postop Assessment: no apparent nausea or vomiting Anesthetic complications: no   No notable events documented.   Last Vitals:  Vitals:   06/09/23 1400 06/09/23 1420  BP:  134/80  Pulse:  77  Resp:  13  Temp: (!) 36.2 C (!) 36.4 C  SpO2:  97%    Last Pain:  Vitals:   06/09/23 1420  TempSrc:   PainSc: 4                  Corinda Gubler

## 2023-06-09 NOTE — Progress Notes (Signed)
Victoria Minium, MD [facility]   [address] [place], [place] [address] Phone: [phone] Fax : [phone]   Subjective: The patient had an ERCP with stone extraction yesterday.  The patient has complaints this morning.  The patient's liver enzymes have gone up with the alkaline phosphatase staying normal and no reports of abdominal pain consistent with any pancreatitis.   Objective: Vital signs in last 24 hours: Vitals:   06/08/23 1742 06/08/23 2053 06/09/23 0234 06/09/23 0756  BP: 112/68 (!) 120/92 122/76 117/66  Pulse: 60 (!) 51 (!) 55 (!) 53  Resp: 17 18 18 18   Temp: 98 F (36.7 C) 98 F (36.7 C) 98.1 F (36.7 C) 98 F (36.7 C)  TempSrc: Oral Oral Oral Oral  SpO2: 98% 100% 99% 100%  Weight:      Height:       Weight change:   Intake/Output Summary (Last 24 hours) at 06/09/2023 6440 Last data filed at 06/09/2023 0500 Gross per 24 hour  Intake 2208.15 ml  Output --  Net 2208.15 ml     Exam: General: The patient is sitting in bed in no apparent distress.   Lab Results: @LABTEST2 @ Micro Results: No results found for this or any previous visit (from the past 240 hour(s)). Studies/Results: DG C-Arm 1-60 Min-No Report  Result Date: 06/08/2023 Fluoroscopy was utilized by the requesting physician.  No radiographic interpretation.   MR 3D Recon At Scanner  Result Date: 06/08/2023 : CLINICAL DATA: Right upper quadrant pain. Biliary disease suspected. EXAM: MRI ABDOMEN WITHOUT AND WITH CONTRAST (INCLUDING MRCP) TECHNIQUE: Multiplanar multisequence MR imaging of the abdomen was performed both before and after the administration of intravenous contrast. Heavily T2-weighted images of the biliary and pancreatic ducts were obtained, and three-dimensional MRCP images were rendered by post processing. CONTRAST: 7.64mL GADAVIST GADOBUTROL 1 MMOL/ML IV SOLN COMPARISON: MRI 06/21/2022 FINDINGS: Lower chest: No acute abnormality. Hepatobiliary: No suspicious enhancing liver  lesions. Sludge noted within the dependent portion of the gallbladder. Gallbladder wall the largest stone measures 7 mm as noted on the ultrasound from earlier today. Gallbladder wall enhancement with thickening noted measuring up to 5 mm. Mild intrahepatic bile duct dilatation. Common bile duct is increased in caliber measuring 8 mm. Abrupt termination of the distal common bile duct is noted where a possible 3 mm impacted stone is noted, image 12/21. No other signs of choledocholithiasis identified. Pancreas: No mass, inflammatory changes, or other parenchymal abnormality identified. Spleen: Within normal limits in size and appearance. Adrenals/Urinary Tract: Normal adrenal glands. No kidney mass, hydronephrosis or nephrolithiasis. Stomach/Bowel: Visualized portions within the abdomen are unremarkable. Vascular/Lymphatic: No pathologically enlarged lymph nodes identified. No abdominal aortic aneurysm demonstrated. Other: No ascites or focal fluid collections identified. Musculoskeletal: No suspicious bone lesions identified. IMPRESSION: 1. Cholelithiasis with gallbladder wall thickening and enhancement. Findings are concerning for acute cholecystitis. 2. Mild intrahepatic and extrahepatic bile duct dilatation. Abrupt termination of the distal common bile duct is noted where a possible 3 mm impacted stone is noted. No other signs of choledocholithiasis identified. Electronically Signed   By: Signa Kell M.D.   On: 06/08/2023 08:15   MR ABDOMEN MRCP W WO CONTAST  Result Date: 06/08/2023 CLINICAL DATA:  Right upper quadrant pain. Biliary disease suspected. EXAM: MRI ABDOMEN WITHOUT AND WITH CONTRAST (INCLUDING MRCP) TECHNIQUE: Multiplanar multisequence MR imaging of the abdomen was performed both before and after the administration of intravenous contrast. Heavily T2-weighted images of the biliary and pancreatic ducts were obtained, and three-dimensional MRCP images  were rendered by post processing. CONTRAST:   7.53mL GADAVIST GADOBUTROL 1 MMOL/ML IV SOLN COMPARISON:  MRI 06/21/2022 FINDINGS: Lower chest: No acute abnormality. Hepatobiliary: No suspicious enhancing liver lesions. Sludge noted within the dependent portion of the gallbladder. Gallbladder wall the largest stone measures 7 mm as noted on the ultrasound from earlier today. Gallbladder wall enhancement with thickening noted measuring up to 5 mm. Mild intrahepatic bile duct dilatation. Common bile duct is increased in caliber measuring 8 mm. Abrupt termination of the distal common bile duct is noted where a possible 3 mm impacted stone is noted, image 12/21. No other signs of choledocholithiasis identified. Pancreas: No mass, inflammatory changes, or other parenchymal abnormality identified. Spleen:  Within normal limits in size and appearance. Adrenals/Urinary Tract: Normal adrenal glands. No kidney mass, hydronephrosis or nephrolithiasis. Stomach/Bowel: Visualized portions within the abdomen are unremarkable. Vascular/Lymphatic: No pathologically enlarged lymph nodes identified. No abdominal aortic aneurysm demonstrated. Other:  No ascites or focal fluid collections identified. Musculoskeletal: No suspicious bone lesions identified. IMPRESSION: 1. Cholelithiasis with gallbladder wall thickening and enhancement. Findings are concerning for acute cholecystitis. 2. Mild intrahepatic and extrahepatic bile duct dilatation. Abrupt termination of the distal common bile duct is noted where a possible 3 mm impacted stone is noted. No other signs of choledocholithiasis identified. Electronically Signed   By: Signa Kell M.D.   On: 06/08/2023 07:34   US ABDOMEN LIMITED RUQ (LIVER/GB)  Result Date: 06/08/2023 CLINICAL DATA:  Right upper quadrant pain EXAM: ULTRASOUND ABDOMEN LIMITED RIGHT UPPER QUADRANT COMPARISON:  05/30/2023 FINDINGS: Gallbladder: Gallbladder is well distended with sludge and gallstones. Mild wall thickening at 3.9 mm is noted. Common bile duct:  Diameter: 7 mm. Echogenicity is noted in the distal common bile duct with shadowing which may represent choledocholithiasis. Liver: No focal lesion identified. Within normal limits in parenchymal echogenicity. Portal vein is patent on color Doppler imaging with normal direction of blood flow towards the liver. Other: None. IMPRESSION: Cholelithiasis with gallbladder sludge. Negative sonographic Murphy's sign is elicited all though the patient is medicated. Increase in the common bile duct diameter to 7 mm from 3.6 mm on the most recent exam. There are findings suggestive of distal choledocholithiasis. Electronically Signed   By: Alcide Clever M.D.   On: 06/08/2023 03:32   Medications: I have reviewed the patient's current medications. Scheduled Meds:  enoxaparin (LOVENOX) injection  40 mg Subcutaneous Q24H   famotidine  20 mg Oral BID   sucralfate  1 g Oral QID   Continuous Infusions:  sodium chloride 1,000 mL (06/09/23 0551)   piperacillin-tazobactam (ZOSYN)  IV 3.375 g (06/08/23 2243)   PRN Meds:.bisacodyl, fentaNYL (SUBLIMAZE) injection, HYDROmorphone (DILAUDID) injection, LORazepam, ondansetron (ZOFRAN) IV, senna-docusate   Assessment: Principal Problem:   Acute cholecystitis Active Problems:   Cholangitis due to bile duct calculus with obstruction   Biliary colic   Choledocholithiasis    Plan: The patient had an ERCP that was uneventful with a stone extraction.  The patient has been doing well overnight.  She is having some elevation of her AST and ALT and slight bump in her bilirubin which can be from the ERCP with injection of contrast into the biliary tree.  The patient is to have surgery to have her gallbladder out today.  Nothing further to do from a GI point of view.  I will sign off.  Please call if any further GI concerns or questions.  We would like to thank you for the opportunity to participate in the care  of Victoria Villarreal.    LOS: 1 day   Sherlyn Hay 06/09/2023,  8:07 AM Pager [phone] 7am-5pm  Check AMION for 5pm -7am coverage and on weekends

## 2023-06-10 ENCOUNTER — Other Ambulatory Visit: Payer: Self-pay

## 2023-06-10 ENCOUNTER — Telehealth: Payer: Self-pay | Admitting: Physician Assistant

## 2023-06-10 DIAGNOSIS — K8020 Calculus of gallbladder without cholecystitis without obstruction: Secondary | ICD-10-CM

## 2023-06-10 NOTE — Telephone Encounter (Signed)
Brief Note Patient known to our practice and had been previously dismissed. However, patient require cholecystectomy inpatient on 08/28 with Dr Everlene Farrier secondary to choledocholithiasis. She did require drain placement intra-operatively. Made attempts at scheduling follow up on 09/05 for drain removal however unable to make this appointment in EMR as patient has been dismissed. Staff have made attempts to call patient without success.   Plan for 09/05 follow up CMP prior to this appointment if feasible  -- Lynden Oxford, PA-C [facility] 06/10/2023, 2:04 PM M-F: 7am - 4pm

## 2023-06-11 NOTE — Anesthesia Postprocedure Evaluation (Signed)
Anesthesia Post Note  Patient: Victoria Villarreal  Procedure(s) Performed: ENDOSCOPIC RETROGRADE CHOLANGIOPANCREATOGRAPHY (ERCP) WITH PROPOFOL SPHINCTEROTOMY REMOVAL OF STONES  Patient location during evaluation: PACU Anesthesia Type: General Level of consciousness: awake and alert Pain management: pain level controlled Vital Signs Assessment: post-procedure vital signs reviewed and stable Respiratory status: spontaneous breathing, nonlabored ventilation, respiratory function stable and patient connected to nasal cannula oxygen Cardiovascular status: blood pressure returned to baseline and stable Postop Assessment: no apparent nausea or vomiting Anesthetic complications: no   No notable events documented.   Last Vitals:  Vitals:   06/09/23 1420 06/09/23 1441  BP: 134/80 125/77  Pulse: 77 61  Resp: 13 18  Temp: (!) 36.4 C 36.5 C  SpO2: 97% 100%    Last Pain:  Vitals:   06/09/23 1441  TempSrc: Oral  PainSc:                  Yevette Edwards

## 2023-06-16 ENCOUNTER — Ambulatory Visit (INDEPENDENT_AMBULATORY_CARE_PROVIDER_SITE_OTHER): Payer: Medicaid Other | Admitting: Surgery

## 2023-06-16 ENCOUNTER — Encounter: Payer: Self-pay | Admitting: Surgery

## 2023-06-16 VITALS — BP 139/85 | HR 86 | Temp 98.6°F | Ht 67.0 in | Wt 186.6 lb

## 2023-06-16 DIAGNOSIS — Z09 Encounter for follow-up examination after completed treatment for conditions other than malignant neoplasm: Secondary | ICD-10-CM

## 2023-06-16 DIAGNOSIS — K810 Acute cholecystitis: Secondary | ICD-10-CM

## 2023-06-16 DIAGNOSIS — K811 Chronic cholecystitis: Secondary | ICD-10-CM

## 2023-06-16 DIAGNOSIS — K8050 Calculus of bile duct without cholangitis or cholecystitis without obstruction: Secondary | ICD-10-CM

## 2023-06-16 NOTE — Patient Instructions (Signed)
Minimally Invasive Cholecystectomy, Care After What can I expect after the procedure? After the procedure, it is common to: Have pain at the areas of surgery. You will be given medicines for pain. Vomit or feel like you may vomit. Feel fullness in the belly (bloating) or have pain in the shoulder. This comes from the gas that was used during the surgery. Follow these instructions at home: Medicines Take over-the-counter and prescription medicines only as told by your doctor. If you were prescribed an antibiotic medicine, take it as told by your doctor. Do not stop taking it even if you start to feel better. If told, take steps to prevent problems with pooping (constipation). You may need to: Drink enough fluid to keep your pee (urine) pale yellow. Take medicines. You will be told what medicines to take. Eat foods that are high in fiber. These include beans, whole grains, and fresh fruits and vegetables. Limit foods that are high in fat and sugar. These include fried or sweet foods. Ask your doctor if you should avoid driving or using machines while you are taking your medicine. Incision care  Follow instructions from your doctor about how to take care of your cuts from surgery (incisions). Make sure you: Wash your hands with soap and water for at least 20 seconds before and after you change your bandage (dressing). If you cannot use soap and water, use hand sanitizer. Change your bandage. Leave stitches (sutures) or skin glue in place for at least 2 weeks. Leave tape strips alone unless you are told to take them off. You may trim the edges of the tape strips if they curl up. Do not take baths, swim, or use a hot tub. Ask your doctor about taking showers or sponge baths. Check your incision area every day for signs of infection. Check for: More redness, swelling, or pain. Fluid or blood. Warmth. Pus or a bad smell. Activity Rest as told by your doctor. Do not do activities that require a  lot of effort. Get up to take short walks every 1 to 2 hours. Ask for help if you feel weak or unsteady. Do not lift anything that is heavier than 10 lb (4.5 kg), or the limit that you are told. Do not play contact sports until your doctor says it is okay. Do not return to work or school until your doctor says it is okay. Return to your normal activities when your doctor says that it is safe. General instructions If you were given a sedative during your procedure, do not drive or use machines until your doctor says that it is safe. A sedative is a medicine that helps you relax. Keep all follow-up visits. Contact a doctor if: You get a rash. You have more redness, swelling, or pain around your incisions. You have fluid or blood coming from your incisions. Your incisions feel warm to the touch. You have pus or a bad smell coming from your incisions. You have a fever. One or more of your incisions breaks open. Get help right away if: You have trouble breathing. You have chest pain. You have pain that is getting worse in your shoulders. You faint or feel dizzy when you stand. You have very bad pain in your belly (abdomen). You feel like you may vomit or you vomit, and this lasts for more than one day. You have leg pain. These symptoms may be an emergency. Get help right away. Call 911. Do not wait to see if the symptoms will  go away. Do not drive yourself to the hospital. Summary After your surgery, it is common to have pain at the areas of surgery. You may also vomit or feel fullness in the belly. Follow your doctor's instructions about medicine, activity restrictions, and caring for your surgery areas. Do not do activities that require a lot of effort. Contact a doctor if you have a fever or other signs of infection, such as more redness, swelling, or pain around your incisions. Get help right away if you have chest pain, increasing pain in the shoulders, or trouble breathing. This  information is not intended to replace advice given to you by your health care provider. Make sure you discuss any questions you have with your health care provider. Document Revised: 04/01/2021 Document Reviewed: 04/01/2021 Elsevier Patient Education  2024 [employer].

## 2023-06-16 NOTE — Progress Notes (Signed)
06/16/2023  HPI: KEVIANA BALSAM is a 27 y.o. female s/p presenting for follow-up status post robotic assisted cholecystectomy on 06/09/2023 with Dr. Everlene Farrier.  Patient denies any worsening pain or nausea.  Her drain is emptying about 50 -100 mL of serous fluid daily.  Vital signs: BP 139/85   Pulse 86   Temp 98.6 F (37 C) (Oral)   Ht 5\' 7"  (1.702 m)   Wt 186 lb 9.6 oz (84.6 kg)   LMP 05/30/2023 (Exact Date)   SpO2 97%   BMI 29.23 kg/m    Physical Exam: Constitutional: No acute distress Abdomen: Soft, nondistended, appropriately sore to palpation.  Incisions are clean, dry, intact.  Right-sided drain with serous fluid.  Drain was removed without complications.  Dry gauze dressing applied.  Assessment/Plan: This is a 27 y.o. female s/p robotic assisted cholecystectomy.  - Drain was removed without complications at bedside.  Instructed on how to do daily dressing changes until the wound is fully healed. - Reminded her of activity restrictions. - Follow-up as needed.   Howie Ill, MD [facility]

## 2023-07-07 ENCOUNTER — Encounter: Payer: Self-pay | Admitting: Surgery
